# Patient Record
Sex: Female | Born: 2000 | Race: Black or African American | Hispanic: No | Marital: Single | Smoking: Never smoker
Health system: Southern US, Community
[De-identification: ages and names within clinical notes are randomized; demographics above are authoritative.]

## PROBLEM LIST (undated history)

## (undated) DIAGNOSIS — F419 Anxiety disorder, unspecified: Secondary | ICD-10-CM

## (undated) DIAGNOSIS — O24419 Gestational diabetes mellitus in pregnancy, unspecified control: Secondary | ICD-10-CM

## (undated) DIAGNOSIS — D571 Sickle-cell disease without crisis: Secondary | ICD-10-CM

## (undated) HISTORY — PX: NO PAST SURGERIES: SHX2092

## (undated) HISTORY — DX: Anxiety disorder, unspecified: F41.9

## (undated) NOTE — *Deleted (*Deleted)
Cardiac telemetry discontinued per order.

---
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
## 2018-02-01 ENCOUNTER — Emergency Department (HOSPITAL_COMMUNITY)
Admission: EM | Admit: 2018-02-01 | Discharge: 2018-02-02 | Disposition: A | Discharge status: Home / Self Care | Payer: Medicaid Other | Attending: Emergency Medicine | Admitting: Emergency Medicine

## 2018-02-01 ENCOUNTER — Other Ambulatory Visit: Payer: Self-pay

## 2018-02-01 ENCOUNTER — Encounter (HOSPITAL_COMMUNITY): Payer: Self-pay

## 2018-02-01 DIAGNOSIS — D57219 Sickle-cell/Hb-C disease with crisis, unspecified: Secondary | ICD-10-CM | POA: Diagnosis present

## 2018-02-01 DIAGNOSIS — D5700 Hb-SS disease with crisis, unspecified: Secondary | ICD-10-CM

## 2018-02-01 LAB — CBC WITH DIFFERENTIAL/PLATELET
BASOS ABS: 0.1 10*3/uL (ref 0.0–0.1)
Basophils Relative: 1 %
EOS ABS: 0.2 10*3/uL (ref 0.0–0.7)
Eosinophils Relative: 3 %
HCT: 29.4 % — ABNORMAL LOW (ref 36.0–46.0)
HEMOGLOBIN: 10.5 g/dL — AB (ref 12.0–15.0)
Lymphocytes Relative: 31 %
Lymphs Abs: 2 10*3/uL (ref 0.7–4.0)
MCH: 24.1 pg — ABNORMAL LOW (ref 26.0–34.0)
MCHC: 35.7 g/dL (ref 30.0–36.0)
MCV: 67.6 fL — ABNORMAL LOW (ref 78.0–100.0)
MONOS PCT: 9 %
Monocytes Absolute: 0.6 10*3/uL (ref 0.1–1.0)
NEUTROS PCT: 56 %
Neutro Abs: 3.7 10*3/uL (ref 1.7–7.7)
PLATELETS: 176 10*3/uL (ref 150–400)
RBC: 4.35 MIL/uL (ref 3.87–5.11)
RDW: 19 % — ABNORMAL HIGH (ref 11.5–15.5)
SMEAR REVIEW: ADEQUATE
WBC: 6.6 10*3/uL (ref 4.0–10.5)

## 2018-02-01 LAB — COMPREHENSIVE METABOLIC PANEL
ALBUMIN: 4 g/dL (ref 3.5–5.0)
ALK PHOS: 64 U/L (ref 38–126)
ALT: 18 U/L (ref 14–54)
ANION GAP: 9 (ref 5–15)
AST: 20 U/L (ref 15–41)
BILIRUBIN TOTAL: 1.3 mg/dL — AB (ref 0.3–1.2)
BUN: 8 mg/dL (ref 6–20)
CALCIUM: 9 mg/dL (ref 8.9–10.3)
CO2: 22 mmol/L (ref 22–32)
Chloride: 108 mmol/L (ref 101–111)
Creatinine, Ser: 0.67 mg/dL (ref 0.44–1.00)
GFR calc non Af Amer: >60 mL/min (ref >60)
GLUCOSE: 83 mg/dL (ref 65–99)
POTASSIUM: 4.2 mmol/L (ref 3.5–5.1)
SODIUM: 139 mmol/L (ref 135–145)
TOTAL PROTEIN: 7.1 g/dL (ref 6.5–8.1)

## 2018-02-01 LAB — I-STAT BETA HCG BLOOD, ED (MC, WL, AP ONLY)

## 2018-02-01 LAB — RETICULOCYTES
RBC.: 4.35 MIL/uL (ref 3.87–5.11)
RETIC COUNT ABSOLUTE: 113.1 10*3/uL (ref 19.0–186.0)
RETIC CT PCT: 2.6 % (ref 0.4–3.1)

## 2018-02-01 MED ORDER — HYDROMORPHONE HCL 1 MG/ML IJ SOLN
2.0000 mg | INTRAMUSCULAR | Status: AC
  Administered 2018-02-01: 2 mg via INTRAVENOUS
  Filled 2018-02-01: qty 2

## 2018-02-01 MED ORDER — HYDROMORPHONE HCL 1 MG/ML IJ SOLN: 2.0000 mg | INTRAMUSCULAR | Status: DC

## 2018-02-01 MED ORDER — KETOROLAC TROMETHAMINE 30 MG/ML IJ SOLN
30.0000 mg | INTRAMUSCULAR | Status: AC
  Administered 2018-02-01: 30 mg via INTRAVENOUS
  Filled 2018-02-01: qty 1

## 2018-02-01 MED ORDER — DEXTROSE-NACL 5-0.45 % IV SOLN
INTRAVENOUS | Status: AC
  Administered 2018-02-01: 20:00:00 via INTRAVENOUS

## 2018-02-01 MED ORDER — ONDANSETRON HCL 4 MG/2ML IJ SOLN: 4.0000 mg | INTRAMUSCULAR | Status: DC | PRN

## 2018-02-01 MED ORDER — DIPHENHYDRAMINE HCL 50 MG/ML IJ SOLN
25.0000 mg | Freq: Once | INTRAMUSCULAR | Status: AC
  Administered 2018-02-01: 25 mg via INTRAVENOUS
  Filled 2018-02-01: qty 1

## 2018-02-01 MED ORDER — HYDROMORPHONE HCL 1 MG/ML IJ SOLN: 2.0000 mg | INTRAMUSCULAR | Status: AC

## 2018-02-01 NOTE — ED Provider Notes (Signed)
[facility] Provider Note   CSN: [id number] Arrival date & time: 02/01/18  1424     History   Chief Complaint No chief complaint on file.   HPI Stacy Graciella FreerM Bechtol is a 17 y.o. female.  HPI  17 year old female presents the emergency department with 48 hours of right arm/right lower extremity pain that is similar to her acute on chronic exacerbations with in the setting of sickle cell disease.  Patient states she is taking her oxycodone with no relief of pain.  Patient denies any fever, chest pain, shortness of breath.  Patient denies any recent trauma or illness.  Past Medical History:  Diagnosis Date  . Sickle cell anemia (HCC)   . Sickle cell anemia [facility](HCC)     Patient Active Problem List   Diagnosis Date Noted  . Sickle-cell disease with pain (HCC) 08/01/2017  . Sickle cell disease, type [place], with unspecified crisis (HCC) 07/31/2017  . Sickle cell crisis (HCC) 05/28/2016  . 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 Sickle cell pain crisis (HCC) 01/29/2014  . Sickle cell pain crisis 12/12/2011    Past Surgical History:  Procedure Laterality Date  . NO PAST SURGERIES      OB History    No data available       Home Medications    Prior to Admission medications   Medication Sig Start Date End Date Taking? Authorizing Provider  acetaminophen (TYLENOL) 325 MG tablet Take 1.5 tablets (487.5 mg total) by mouth every 6 (six) hours. Patient not taking: Reported on 11/29/2017 01/27/17   Gardenia PhlegmMoore, Melissa Kepke, MD  ibuprofen (ADVIL,MOTRIN) 800 MG tablet Take 0.5 tablets (400 mg total) by mouth every 6 (six) hours. Patient not taking: Reported on 11/29/2017 01/27/17   Gardenia PhlegmMoore, Melissa Kepke, MD  medroxyPROGESTERone (DEPO-PROVERA) 150 MG/ML injection Inject 150 mg into the muscle every 3 (three) months. 01/17/18   [provider]  oxyCODONE (OXY IR/ROXICODONE) 5 MG immediate release tablet Take 1 tablet (5 mg total) by mouth every 6 (six)  hours as needed for moderate pain or severe pain. Patient not taking: Reported on 11/29/2017 07/26/17   Toney RakesLacroce, Samantha J, MD    Family History Family History  Problem Relation Age of Onset  . Sickle cell anemia Brother     Social History Social History   Tobacco Use  . Smoking status: Never Smoker  . Smokeless tobacco: Never Used  Substance Use Topics  . Alcohol use: No  . Drug use: No     Allergies   Patient has no known allergies.   Review of Systems Review of Systems  Review of Systems  Constitutional: Negative for fever and chills.  HENT: Negative for ear pain, sore throat and trouble swallowing.   Eyes: Negative for pain and visual disturbance.  Respiratory: Negative for cough and shortness of breath.   Cardiovascular: Negative for chest pain and leg swelling.  Gastrointestinal: Negative for nausea, vomiting, abdominal pain and diarrhea.  Genitourinary: Negative for dysuria, urgency and frequency.  Musculoskeletal: see HPI Skin: Negative for rash and wound.  Neurological: Negative for dizziness, syncope, speech difficulty, weakness and numbness.   Physical Exam Updated Vital Signs BP 107/75   Pulse (!) 104   Temp 98.2 F (36.8 C) (Oral)   Resp 13   Ht 5\' 7"  (1.702 m)   Wt 88.5 kg (195 lb)   LMP 01/18/2018   SpO2 99%   BMI 30.54 kg/m   Physical Exam  Physical Exam Vitals:  02/01/18 2300 02/01/18 2315  BP: 109/74 107/75  Pulse: 91 (!) 104  Resp: 13 13  Temp:    SpO2: 99% 99%   Constitutional: Patient is in no acute distress Head: Normocephalic and atraumatic.  Eyes: Extraocular motion intact, no scleral icterus Neck: Supple without meningismus, mass, or overt JVD Respiratory: Effort normal and breath sounds normal. No respiratory distress. CV: Heart regular rate and rhythm, no obvious murmurs.  Pulses +2 and symmetric Abdomen: Soft, non-tender, non-distended MSK: Extremities are atraumatic without deformity, ROM intact Skin: Warm, dry,  intact Neuro: Alert and oriented, no motor deficit noted Psychiatric: Mood and affect are normal.  ED Treatments / Results  Labs (all labs ordered are listed, but only abnormal results are displayed) Labs Reviewed  COMPREHENSIVE METABOLIC PANEL - Abnormal; Notable for the following components:      Result Value   Total Bilirubin 1.3 (*)    All other components within normal limits  CBC WITH DIFFERENTIAL/PLATELET - Abnormal; Notable for the following components:   Hemoglobin 10.5 (*)    HCT 29.4 (*)    MCV 67.6 (*)    MCH 24.1 (*)    RDW 19.0 (*)    All other components within normal limits  RETICULOCYTES  I-STAT BETA HCG BLOOD, ED ([facility], [facility], [facility] ONLY)    EKG  EKG Interpretation None       Radiology No results found.  Procedures Procedures (including critical care time)  Medications Ordered in ED Medications  dextrose 5 %-0.45 % sodium chloride infusion ( Intravenous Stopped 02/01/18 2104)  ondansetron (ZOFRAN) injection 4 mg (not administered)  HYDROmorphone (DILAUDID) injection 2 mg (not administered)  ketorolac (TORADOL) 30 MG/ML injection 30 mg (30 mg Intravenous Given 02/01/18 1931)  diphenhydrAMINE (BENADRYL) injection 25 mg (25 mg Intravenous Given 02/01/18 1930)  HYDROmorphone (DILAUDID) injection 2 mg (2 mg Intravenous Given 02/01/18 1932)  HYDROmorphone (DILAUDID) injection 2 mg (2 mg Intravenous Given 02/01/18 2102)     Initial Impression / Assessment and Plan / ED Course  I have reviewed the triage vital signs and the nursing notes.  Pertinent labs & imaging results that were available during my care of the patient were reviewed by me and considered in my medical decision making (see chart for details).     17 year old female presents the emergency department with 48 hours of right arm/right lower extremity pain that is similar to her acute on chronic exacerbations with in the setting of sickle cell disease.  Patient states she is taking her oxycodone with no  relief of pain.  Patient denies any fever, chest pain, shortness of breath.  Patient denies any recent trauma or illness.  Patient arrives afebrile hemodynamically stable well-appearing.  Physical exam as annotated above was unremarkable.  Review of patient's labs shows no evidence of leukocytosis concerning for infection, stable hemoglobin compared to last and normal.  Normal reticulocyte count.  Patient received total of 6 mg Dilaudid along with D5 normal saline bolus with resolution of symptoms currently asymptomatic.  Patient was hemodynamically stable prior to discharge.  Do not suspect chest pain syndrome, acute infection.  Resolution of sickle cell pain crisis.   Recommendation for d/c home with continued po narcotics and f/u PCP prn. Return precautions given.   Final Clinical Impressions(s) / ED Diagnoses   Final diagnoses:  Sickle cell pain crisis [facility])    ED Discharge Orders    None       Jaynie Collins, DO 02/01/18 2347    Melene Plan,  DO 02/01/18 2351

## 2018-02-01 NOTE — ED Notes (Signed)
Pt did not arouse after this nurse knocked on door and entered room, chest rise and fall easy and unlabored.

## 2018-02-01 NOTE — ED Triage Notes (Signed)
Pt presents to the ed with sickle cell pain in her right arm and leg x 2 days. NAD in triage.

## 2018-02-07 ENCOUNTER — Emergency Department (HOSPITAL_COMMUNITY)
Admission: EM | Admit: 2018-02-07 | Discharge: 2018-02-08 | Disposition: A | Discharge status: Home / Self Care | Payer: Medicaid Other | Attending: Physician Assistant | Admitting: Physician Assistant

## 2018-02-07 ENCOUNTER — Other Ambulatory Visit: Payer: Self-pay

## 2018-02-07 DIAGNOSIS — Z79899 Other long term (current) drug therapy: Secondary | ICD-10-CM | POA: Diagnosis not present

## 2018-02-07 DIAGNOSIS — D5700 Hb-SS disease with crisis, unspecified: Secondary | ICD-10-CM | POA: Insufficient documentation

## 2018-02-07 DIAGNOSIS — M79601 Pain in right arm: Secondary | ICD-10-CM | POA: Diagnosis present

## 2018-02-07 LAB — CBC WITH DIFFERENTIAL/PLATELET
BASOS ABS: 0 10*3/uL (ref 0.0–0.1)
Basophils Relative: 0 %
EOS PCT: 3 %
Eosinophils Absolute: 0.2 10*3/uL (ref 0.0–0.7)
HCT: 29.1 % — ABNORMAL LOW (ref 36.0–46.0)
HEMOGLOBIN: 10.5 g/dL — AB (ref 12.0–15.0)
LYMPHS PCT: 36 %
Lymphs Abs: 2.2 10*3/uL (ref 0.7–4.0)
MCH: 24.4 pg — ABNORMAL LOW (ref 26.0–34.0)
MCHC: 36.1 g/dL — ABNORMAL HIGH (ref 30.0–36.0)
MCV: 67.7 fL — ABNORMAL LOW (ref 78.0–100.0)
MONOS PCT: 6 %
Monocytes Absolute: 0.4 10*3/uL (ref 0.1–1.0)
NEUTROS ABS: 3.3 10*3/uL (ref 1.7–7.7)
Neutrophils Relative %: 55 %
Platelets: 161 10*3/uL (ref 150–400)
RBC: 4.3 MIL/uL (ref 3.87–5.11)
RDW: 19 % — ABNORMAL HIGH (ref 11.5–15.5)
WBC: 6.1 10*3/uL (ref 4.0–10.5)

## 2018-02-07 LAB — COMPREHENSIVE METABOLIC PANEL
ALBUMIN: 4.1 g/dL (ref 3.5–5.0)
ALT: 12 U/L — ABNORMAL LOW (ref 14–54)
ANION GAP: 9 (ref 5–15)
AST: 16 U/L (ref 15–41)
Alkaline Phosphatase: 63 U/L (ref 38–126)
BILIRUBIN TOTAL: 1.2 mg/dL (ref 0.3–1.2)
BUN: 10 mg/dL (ref 6–20)
CHLORIDE: 105 mmol/L (ref 101–111)
CO2: 27 mmol/L (ref 22–32)
Calcium: 8.9 mg/dL (ref 8.9–10.3)
Creatinine, Ser: 0.63 mg/dL (ref 0.44–1.00)
GFR calc Af Amer: >60 mL/min (ref >60)
GFR calc non Af Amer: >60 mL/min (ref >60)
GLUCOSE: 114 mg/dL — AB (ref 65–99)
POTASSIUM: 3.8 mmol/L (ref 3.5–5.1)
SODIUM: 141 mmol/L (ref 135–145)
TOTAL PROTEIN: 7.6 g/dL (ref 6.5–8.1)

## 2018-02-07 LAB — RETICULOCYTES
RBC.: 4.3 MIL/uL (ref 3.87–5.11)
RETIC COUNT ABSOLUTE: 116.1 10*3/uL (ref 19.0–186.0)
Retic Ct Pct: 2.7 % (ref 0.4–3.1)

## 2018-02-07 LAB — HCG, QUANTITATIVE, PREGNANCY: hCG, Beta Chain, Quant, S: <1 m[IU]/mL (ref <5)

## 2018-02-07 MED ORDER — ONDANSETRON HCL 4 MG/2ML IJ SOLN: 4.0000 mg | INTRAMUSCULAR | Status: DC | PRN

## 2018-02-07 MED ORDER — HYDROMORPHONE HCL 1 MG/ML IJ SOLN: 1.0000 mg | INTRAMUSCULAR | Status: AC

## 2018-02-07 MED ORDER — KETOROLAC TROMETHAMINE 30 MG/ML IJ SOLN
30.0000 mg | INTRAMUSCULAR | Status: AC
  Administered 2018-02-07: 30 mg via INTRAVENOUS
  Filled 2018-02-07: qty 1

## 2018-02-07 MED ORDER — HYDROMORPHONE HCL 1 MG/ML IJ SOLN
1.0000 mg | INTRAMUSCULAR | Status: AC
  Administered 2018-02-07: 1 mg via INTRAVENOUS

## 2018-02-07 MED ORDER — HYDROMORPHONE HCL 1 MG/ML IJ SOLN: 0.5000 mg | INTRAMUSCULAR | Status: AC

## 2018-02-07 MED ORDER — HYDROMORPHONE HCL 1 MG/ML IJ SOLN
0.5000 mg | INTRAMUSCULAR | Status: AC
  Filled 2018-02-07: qty 1

## 2018-02-07 MED ORDER — HYDROMORPHONE HCL 1 MG/ML IJ SOLN
1.0000 mg | INTRAMUSCULAR | Status: AC
  Filled 2018-02-07: qty 1

## 2018-02-07 MED ORDER — HYDROMORPHONE HCL 1 MG/ML IJ SOLN
1.0000 mg | INTRAMUSCULAR | Status: AC
  Administered 2018-02-08: 1 mg via INTRAVENOUS
  Filled 2018-02-07: qty 1

## 2018-02-07 MED ORDER — DIPHENHYDRAMINE HCL 50 MG/ML IJ SOLN
25.0000 mg | Freq: Once | INTRAMUSCULAR | Status: AC
  Administered 2018-02-07: 25 mg via INTRAVENOUS
  Filled 2018-02-07: qty 1

## 2018-02-07 MED ORDER — SODIUM CHLORIDE 0.45 % IV SOLN
INTRAVENOUS | Status: DC
  Administered 2018-02-07: via INTRAVENOUS

## 2018-02-07 NOTE — ED Provider Notes (Signed)
[facility]   CSN: [id number] Arrival date & time: 02/07/18  1901     History   Chief Complaint Chief Complaint  Patient presents with  . Sickle Cell Pain Crisis    HPI Nicole Case is a 17 y.o. female.  HPI   Patient is an 17 year old female with a history of sickle cell anemia, [place] disease presenting for right arm pain.  Patient reports that her pain is consistent with a sickle cell pain crisis.  Patient reports that she had similar pain last week, although it affected her right arm and her right leg.  Patient denies any recent trauma or injury preceding her arm pain.  Patient also noted to have lower back pain bilateral in nature.  Patient denies any weakness, numbness, loss of bowel bladder control, saddle anesthesia. Patient denies any chest pain, shortness of breath, fevers, chills, erythema or edema of her joints, abdominal pain, nausea, vomiting.  Patient reports taking her home oxycodone, unknown dose to her, without relief.   Past Medical History:  Diagnosis Date  . Sickle cell anemia (HCC)   . Sickle cell anemia [facility](HCC)     Patient Active Problem List   Diagnosis Date Noted  . Sickle-cell disease with pain (HCC) 08/01/2017  . Sickle cell disease, type [place], with unspecified crisis (HCC) 07/31/2017  . Sickle cell crisis (HCC) 05/28/2016  . 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 Sickle cell pain crisis (HCC) 01/29/2014  . Sickle cell pain crisis 12/12/2011    Past Surgical History:  Procedure Laterality Date  . NO PAST SURGERIES      OB History    No data available       Home Medications    Prior to Admission medications   Medication Sig Start Date End Date Taking? Authorizing Provider  medroxyPROGESTERone (DEPO-PROVERA) 150 MG/ML injection Inject 150 mg into the muscle every 3 (three) months. 01/17/18  Yes [provider]  oxyCODONE (OXY IR/ROXICODONE) 5 MG immediate release tablet Take 1 tablet  (5 mg total) by mouth every 6 (six) hours as needed for moderate pain or severe pain. 07/26/17  Yes Lacroce, Ames CoupeSamantha J, MD  acetaminophen (TYLENOL) 325 MG tablet Take 1.5 tablets (487.5 mg total) by mouth every 6 (six) hours. Patient not taking: Reported on 11/29/2017 01/27/17   Gardenia PhlegmMoore, Melissa Kepke, MD  ibuprofen (ADVIL,MOTRIN) 800 MG tablet Take 0.5 tablets (400 mg total) by mouth every 6 (six) hours. Patient not taking: Reported on 11/29/2017 01/27/17   Gardenia PhlegmMoore, Melissa Kepke, MD    Family History Family History  Problem Relation Age of Onset  . Sickle cell anemia Brother     Social History Social History   Tobacco Use  . Smoking status: Never Smoker  . Smokeless tobacco: Never Used  Substance Use Topics  . Alcohol use: No  . Drug use: No     Allergies   Patient has no known allergies.   Review of Systems Review of Systems  Constitutional: Negative for chills and fever.  HENT: Negative for congestion, sinus pain and sore throat.   Eyes: Negative for visual disturbance.  Respiratory: Negative for cough, chest tightness and shortness of breath.   Cardiovascular: Negative for chest pain and leg swelling.  Gastrointestinal: Negative for abdominal pain, nausea and vomiting.  Genitourinary: Negative for dysuria and flank pain.  Musculoskeletal: Positive for arthralgias, back pain and myalgias.  Skin: Negative for rash.  Neurological: Negative for dizziness, syncope and headaches.  Physical Exam Updated Vital Signs BP 122/75   Pulse 84   Temp 98.1 F (36.7 C) (Oral)   Resp 17   Ht 5\' 7"  (1.702 m)   Wt 88.5 kg (195 lb)   LMP 01/18/2018   SpO2 100%   BMI 30.54 kg/m   Physical Exam  Constitutional: She appears well-developed and well-nourished. No distress.  HENT:  Head: Normocephalic and atraumatic.  Mouth/Throat: Oropharynx is clear and moist.  Eyes: Conjunctivae and EOM are normal. Pupils are equal, round, and reactive to light.  Neck: Normal range of  motion. Neck supple.  Cardiovascular: Normal rate, regular rhythm, S1 normal and S2 normal.  No murmur heard. Pulmonary/Chest: Effort normal and breath sounds normal. She has no wheezes. She has no rales.  Abdominal: Soft. She exhibits no distension. There is no tenderness. There is no guarding.  Musculoskeletal: Normal range of motion. She exhibits no edema or deformity.  Tenderness to palpation of the right humerus without erythema, edema, or deformity.  Lymphadenopathy:    She has no cervical adenopathy.  Neurological: She is alert.  Cranial nerves grossly intact. Patient moves extremities symmetrically and with good coordination.  Skin: Skin is warm and dry. No rash noted. No erythema.  Psychiatric: She has a normal mood and affect. Her behavior is normal. Judgment and thought content normal.  Nursing note and vitals reviewed.    ED Treatments / Results  Labs (all labs ordered are listed, but only abnormal results are displayed) Labs Reviewed  COMPREHENSIVE METABOLIC PANEL - Abnormal; Notable for the following components:      Result Value   Glucose, Bld 114 (*)    ALT 12 (*)    All other components within normal limits  CBC WITH DIFFERENTIAL/PLATELET - Abnormal; Notable for the following components:   Hemoglobin 10.5 (*)    HCT 29.1 (*)    MCV 67.7 (*)    MCH 24.4 (*)    MCHC 36.1 (*)    RDW 19.0 (*)    All other components within normal limits  RETICULOCYTES  HCG, QUANTITATIVE, PREGNANCY    EKG  EKG Interpretation None       Radiology No results found.  Procedures Procedures (including critical care time)  Medications Ordered in ED Medications  0.45 % sodium chloride infusion ( Intravenous New Bag/Given 02/07/18 2330)  HYDROmorphone (DILAUDID) injection 0.5 mg (0.5 mg Intravenous Not Given 02/07/18 2304)    Or  HYDROmorphone (DILAUDID) injection 0.5 mg ( Subcutaneous See Alternative 02/07/18 2304)  HYDROmorphone (DILAUDID) injection 1 mg (not administered)     Or  HYDROmorphone (DILAUDID) injection 1 mg (not administered)  HYDROmorphone (DILAUDID) injection 1 mg (not administered)    Or  HYDROmorphone (DILAUDID) injection 1 mg (not administered)  ondansetron (ZOFRAN) injection 4 mg (not administered)  ketorolac (TORADOL) 30 MG/ML injection 30 mg (30 mg Intravenous Given 02/07/18 2332)  HYDROmorphone (DILAUDID) injection 1 mg (1 mg Intravenous Given 02/07/18 2331)    Or  HYDROmorphone (DILAUDID) injection 1 mg ( Subcutaneous See Alternative 02/07/18 2331)  diphenhydrAMINE (BENADRYL) injection 25 mg (25 mg Intravenous Given 02/07/18 2332)     Initial Impression / Assessment and Plan / ED Course  I have reviewed the triage vital signs and the nursing notes.  Pertinent labs & imaging results that were available during my care of the patient were reviewed by me and considered in my medical decision making (see chart for details).     Nicole Case is a 17 y.o. female who  presents to ED for pain c/w typical sickle cell crisis. No shortness of breath, fevers or signs of acute chest. Patient otherwise in no acute distress objectively other than complaint of pain. Given IV fluids and pain medication. Will obtain labs and continue to monitor with admit vs. Discharge based on response and results of testing.   Labs per baseline.  Stable anemia. No reticulocytosis, which is consistent with patient's last presentation of sickle cell crisis 6 days ago.  Patient re-evaluated and feels much improved. Comfortable with discharge to home. Tolerating PO. Understands to follow up with PCP and reasons to return to ER. All questions answered.  Final Clinical Impressions(s) / ED Diagnoses   Final diagnoses:  Sickle cell pain crisis [facility])    ED Discharge Orders    None       Delia Chimes 02/08/18 0147    Abelino Derrick, MD 02/12/18 1620

## 2018-02-07 NOTE — ED Triage Notes (Signed)
JPt st's she has sickle cell and c/o pain to right arm x's 2 days

## 2018-02-07 NOTE — ED Notes (Signed)
Pt does not need to use restroom. Given 8oz water.

## 2018-02-08 NOTE — Discharge Instructions (Signed)
Please see the information and instructions below regarding your visit.  Your diagnoses today include:  1. Sickle cell pain crisis (HCC)    Tests performed today include: See side panel of your discharge paperwork for testing performed today. Vital signs are listed at the bottom of these instructions.   Your labs are per your baseline.  Medications prescribed:    Take any prescribed medications only as prescribed, and any over the counter medications only as directed on the packaging.  Please resume your home sickle cell pain regimen.  Home care instructions:  Please follow any educational materials contained in this packet.   Please follow-up with your hematologist as soon as possible regarding recurrent sickle cell pain crisis.  Follow-up instructions: Please follow-up with your primary care provider as soon as possible for further evaluation of your symptoms if they are not completely improved.   Return instructions:  Please return to the Emergency Department if you experience worsening symptoms.  Please return to the emergency department if any worsening pain, redness or swelling of your joints chest pain, shortness of breath, belly pain, nausea or vomiting.,  Please return if you have any other emergent concerns.  Additional Information:   Your vital signs today were: BP 120/79    Pulse 74    Temp 98.1 F (36.7 C) (Oral)    Resp 16    Ht 5\' 7"  (1.702 m)    Wt 88.5 kg (195 lb)    LMP 01/18/2018    SpO2 100%    BMI 30.54 kg/m  If your blood pressure (BP) was elevated on multiple readings during this visit above 130 for the top number or above 80 for the bottom number, please have this repeated by your primary care provider within one month. --------------  Thank you for allowing us to participate in your care today.

## 2018-02-27 ENCOUNTER — Encounter (HOSPITAL_COMMUNITY): Payer: Self-pay | Admitting: Emergency Medicine

## 2018-02-27 ENCOUNTER — Other Ambulatory Visit: Payer: Self-pay

## 2018-02-27 DIAGNOSIS — D5700 Hb-SS disease with crisis, unspecified: Secondary | ICD-10-CM | POA: Diagnosis not present

## 2018-02-27 DIAGNOSIS — Z79899 Other long term (current) drug therapy: Secondary | ICD-10-CM | POA: Diagnosis not present

## 2018-02-27 DIAGNOSIS — M79601 Pain in right arm: Secondary | ICD-10-CM | POA: Diagnosis present

## 2018-02-27 LAB — CBC WITH DIFFERENTIAL/PLATELET
BASOS PCT: 0 %
Basophils Absolute: 0 10*3/uL (ref 0.0–0.1)
EOS PCT: 2 %
Eosinophils Absolute: 0.1 10*3/uL (ref 0.0–0.7)
HCT: 28.5 % — ABNORMAL LOW (ref 36.0–46.0)
Hemoglobin: 9.9 g/dL — ABNORMAL LOW (ref 12.0–15.0)
Lymphocytes Relative: 33 %
Lymphs Abs: 2.3 10*3/uL (ref 0.7–4.0)
MCH: 23.5 pg — AB (ref 26.0–34.0)
MCHC: 34.7 g/dL (ref 30.0–36.0)
MCV: 67.7 fL — AB (ref 78.0–100.0)
MONO ABS: 0.5 10*3/uL (ref 0.1–1.0)
Monocytes Relative: 7 %
Neutro Abs: 4.2 10*3/uL (ref 1.7–7.7)
Neutrophils Relative %: 58 %
PLATELETS: 155 10*3/uL (ref 150–400)
RBC: 4.21 MIL/uL (ref 3.87–5.11)
RDW: 19.3 % — AB (ref 11.5–15.5)
WBC: 7.1 10*3/uL (ref 4.0–10.5)

## 2018-02-27 LAB — COMPREHENSIVE METABOLIC PANEL
ALT: 19 U/L (ref 14–54)
AST: 20 U/L (ref 15–41)
Albumin: 3.8 g/dL (ref 3.5–5.0)
Alkaline Phosphatase: 62 U/L (ref 38–126)
Anion gap: 7 (ref 5–15)
BUN: 8 mg/dL (ref 6–20)
CHLORIDE: 108 mmol/L (ref 101–111)
CO2: 26 mmol/L (ref 22–32)
CREATININE: 0.62 mg/dL (ref 0.44–1.00)
Calcium: 8.9 mg/dL (ref 8.9–10.3)
GFR calc Af Amer: >60 mL/min (ref >60)
Glucose, Bld: 101 mg/dL — ABNORMAL HIGH (ref 65–99)
Potassium: 4 mmol/L (ref 3.5–5.1)
Sodium: 141 mmol/L (ref 135–145)
Total Bilirubin: 1.3 mg/dL — ABNORMAL HIGH (ref 0.3–1.2)
Total Protein: 7.1 g/dL (ref 6.5–8.1)

## 2018-02-27 LAB — RETICULOCYTES
RBC.: 4.21 MIL/uL (ref 3.87–5.11)
Retic Count, Absolute: 130.5 10*3/uL (ref 19.0–186.0)
Retic Ct Pct: 3.1 % (ref 0.4–3.1)

## 2018-02-27 LAB — I-STAT BETA HCG BLOOD, ED (MC, WL, AP ONLY): I-stat hCG, quantitative: <5 m[IU]/mL (ref <5)

## 2018-02-27 NOTE — ED Triage Notes (Signed)
Pt reports R arm pain X3 days. States she is in a sickle cell crisis. Has tried her oxycodone prescription with no relief.

## 2018-02-28 ENCOUNTER — Encounter (HOSPITAL_COMMUNITY): Payer: Self-pay | Admitting: Emergency Medicine

## 2018-02-28 ENCOUNTER — Emergency Department (HOSPITAL_COMMUNITY)
Admission: EM | Admit: 2018-02-28 | Discharge: 2018-02-28 | Disposition: A | Discharge status: Home / Self Care | Payer: Medicaid Other | Attending: Emergency Medicine | Admitting: Emergency Medicine

## 2018-02-28 DIAGNOSIS — D5700 Hb-SS disease with crisis, unspecified: Secondary | ICD-10-CM

## 2018-02-28 MED ORDER — HYDROMORPHONE HCL 1 MG/ML IJ SOLN: 2.0000 mg | INTRAMUSCULAR | Status: AC

## 2018-02-28 MED ORDER — DIPHENHYDRAMINE HCL 25 MG PO CAPS
25.0000 mg | ORAL_CAPSULE | Freq: Once | ORAL | Status: AC
  Administered 2018-02-28: 25 mg via ORAL
  Filled 2018-02-28: qty 1

## 2018-02-28 MED ORDER — ONDANSETRON HCL 4 MG/2ML IJ SOLN: 4.0000 mg | INTRAMUSCULAR | Status: DC | PRN

## 2018-02-28 MED ORDER — KETOROLAC TROMETHAMINE 30 MG/ML IJ SOLN
30.0000 mg | INTRAMUSCULAR | Status: AC
  Administered 2018-02-28: 30 mg via INTRAVENOUS
  Filled 2018-02-28: qty 1

## 2018-02-28 MED ORDER — HYDROMORPHONE HCL 1 MG/ML IJ SOLN
2.0000 mg | INTRAMUSCULAR | Status: AC
  Administered 2018-02-28: 2 mg via INTRAVENOUS
  Filled 2018-02-28: qty 2

## 2018-02-28 MED ORDER — DEXTROSE-NACL 5-0.9 % IV SOLN
Freq: Once | INTRAVENOUS | Status: AC
  Administered 2018-02-28: 01:00:00 via INTRAVENOUS

## 2018-02-28 NOTE — ED Provider Notes (Signed)
[facility] EMERGENCY DEPARTMENT Provider Note   CSN: [id number] Arrival date & time: 02/27/18  1849     History   Chief Complaint Chief Complaint  Patient presents with  . Sickle Cell Pain Crisis    HPI Nicole Case is a 17 y.o. female.  The history is provided by the patient.  Sickle Cell Pain Crisis  Location:  Upper extremity (right arm) Severity:  Severe Onset quality:  Gradual Duration:  3 days Similar to previous crisis episodes: yes   Timing:  Constant Progression:  Worsening Chronicity:  Chronic (acute on chronic) Sickle cell genotype:  [place] Context: not alcohol consumption and not infection   Relieved by:  Nothing Worsened by:  Nothing Ineffective treatments:  Prescription drugs (oxycodone ) Associated symptoms: no chest pain, no cough, no fever and no shortness of breath   Risk factors: no cholecystectomy   No atypia, no f/c/r. No CP or SOB.    Past Medical History:  Diagnosis Date  . Sickle cell anemia (HCC)   . Sickle cell anemia [facility])     Patient Active Problem List   Diagnosis Date Noted  . Sickle-cell disease with pain (HCC) 08/01/2017  . Sickle cell disease, type [place], with unspecified crisis (HCC) 07/31/2017  . Sickle cell crisis (HCC) 05/28/2016  . 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 Sickle cell pain crisis (HCC) 01/29/2014  . Sickle cell pain crisis 12/12/2011    Past Surgical History:  Procedure Laterality Date  . NO PAST SURGERIES       OB History   None      Home Medications    Prior to Admission medications   Medication Sig Start Date End Date Taking? Authorizing Provider  acetaminophen (TYLENOL) 325 MG tablet Take 1.5 tablets (487.5 mg total) by mouth every 6 (six) hours. Patient not taking: Reported on 11/29/2017 01/27/17   Gardenia Phlegm, MD  ibuprofen (ADVIL,MOTRIN) 800 MG tablet Take 0.5 tablets (400 mg total) by mouth every 6 (six) hours. Patient not taking: Reported on 11/29/2017 01/27/17    Gardenia Phlegm, MD  medroxyPROGESTERone (DEPO-PROVERA) 150 MG/ML injection Inject 150 mg into the muscle every 3 (three) months. 01/17/18   [provider]  oxyCODONE (OXY IR/ROXICODONE) 5 MG immediate release tablet Take 1 tablet (5 mg total) by mouth every 6 (six) hours as needed for moderate pain or severe pain. 07/26/17   Toney Rakes, MD    Family History Family History  Problem Relation Age of Onset  . Sickle cell anemia Brother     Social History Social History   Tobacco Use  . Smoking status: Never Smoker  . Smokeless tobacco: Never Used  Substance Use Topics  . Alcohol use: No  . Drug use: No     Allergies   Patient has no known allergies.   Review of Systems Review of Systems  Constitutional: Negative for fever.  Respiratory: Negative for cough and shortness of breath.   Cardiovascular: Negative for chest pain, palpitations and leg swelling.  Musculoskeletal: Positive for arthralgias.  All other systems reviewed and are negative.    Physical Exam Updated Vital Signs BP 128/84   Pulse 89   Temp 98.6 F (37 C) (Oral)   Resp 16   Ht 5\' 7"  (1.702 m)   Wt 88.5 kg (195 lb)   SpO2 100%   BMI 30.54 kg/m   Physical Exam  Constitutional: She is oriented to person, place, and time. She appears well-developed and well-nourished. No  distress.  HENT:  Head: Normocephalic and atraumatic.  Nose: Nose normal.  Eyes: Conjunctivae and EOM are normal.  Neck: Normal range of motion. Neck supple. No thyromegaly present.  Cardiovascular: Normal rate, regular rhythm, normal heart sounds and intact distal pulses.  Pulmonary/Chest: Effort normal and breath sounds normal. No stridor. She has no wheezes. She has no rales.  Abdominal: Soft. Bowel sounds are normal. There is no tenderness.  Musculoskeletal: Normal range of motion. She exhibits no edema, tenderness or deformity.       Right elbow: Normal.      Right wrist: Normal.       Right hand: She  exhibits normal capillary refill. Normal sensation noted. Normal strength noted.  Neurological: She is alert and oriented to person, place, and time.  Skin: Skin is warm and dry. Capillary refill takes less than 2 seconds. She is not diaphoretic.  Psychiatric: She has a normal mood and affect.     ED Treatments / Results  Labs (all labs ordered are listed, but only abnormal results are displayed) Labs Reviewed  COMPREHENSIVE METABOLIC PANEL - Abnormal; Notable for the following components:      Result Value   Glucose, Bld 101 (*)    Total Bilirubin 1.3 (*)    All other components within normal limits  CBC WITH DIFFERENTIAL/PLATELET - Abnormal; Notable for the following components:   Hemoglobin 9.9 (*)    HCT 28.5 (*)    MCV 67.7 (*)    MCH 23.5 (*)    RDW 19.3 (*)    All other components within normal limits  RETICULOCYTES  I-STAT BETA HCG BLOOD, ED ([facility], [facility], [facility] ONLY)    EKG None  Radiology No results found.  Procedures Procedures (including critical care time)  Medications Ordered in ED Medications  ketorolac (TORADOL) 30 MG/ML injection 30 mg (has no administration in time range)  HYDROmorphone (DILAUDID) injection 2 mg (has no administration in time range)    Or  HYDROmorphone (DILAUDID) injection 2 mg (has no administration in time range)  HYDROmorphone (DILAUDID) injection 2 mg (has no administration in time range)    Or  HYDROmorphone (DILAUDID) injection 2 mg (has no administration in time range)  HYDROmorphone (DILAUDID) injection 2 mg (has no administration in time range)    Or  HYDROmorphone (DILAUDID) injection 2 mg (has no administration in time range)  HYDROmorphone (DILAUDID) injection 2 mg (has no administration in time range)    Or  HYDROmorphone (DILAUDID) injection 2 mg (has no administration in time range)  ondansetron (ZOFRAN) injection 4 mg (has no administration in time range)  diphenhydrAMINE (BENADRYL) capsule 25 mg (has no administration in  time range)  dextrose 5 %-0.9 % sodium chloride infusion (has no administration in time range)       Final Clinical Impressions(s) / ED Diagnoses   Return for weakness, numbness, changes in vision or speech, fevers >100.4 unrelieved by medication, shortness of breath, intractable vomiting, or diarrhea, abdominal pain, Inability to tolerate liquids or food, cough, altered mental status or any concerns. No signs of systemic illness or infection. The patient is nontoxic-appearing on exam and vital signs are within normal limits.   I have reviewed the triage vital signs and the nursing notes. Pertinent labs &imaging results that were available during my care of the patient were reviewed by me and considered in my medical decision making (see chart for details).  After history, exam, and medical workup I feel the patient has been appropriately medically screened  and is safe for discharge home. Pertinent diagnoses were discussed with the patient. Patient was given return precautions.   Taygen Acklin, MD 02/28/18 [phone]

## 2018-02-28 NOTE — ED Notes (Signed)
EDP notified that patient had ride and needed to be discharged; EDP advised patient may sign out A[facility],RN

## 2018-02-28 NOTE — ED Notes (Signed)
E-pad malfunctioned while patient signed AMA-Monique,RN

## 2018-03-05 ENCOUNTER — Encounter (HOSPITAL_COMMUNITY): Payer: Self-pay

## 2018-03-05 ENCOUNTER — Other Ambulatory Visit: Payer: Self-pay

## 2018-03-05 ENCOUNTER — Emergency Department (HOSPITAL_COMMUNITY)
Admission: EM | Admit: 2018-03-05 | Discharge: 2018-03-06 | Disposition: A | Discharge status: Home / Self Care | Payer: Medicaid Other | Attending: Emergency Medicine | Admitting: Emergency Medicine

## 2018-03-05 DIAGNOSIS — Z79899 Other long term (current) drug therapy: Secondary | ICD-10-CM | POA: Diagnosis not present

## 2018-03-05 DIAGNOSIS — M79602 Pain in left arm: Secondary | ICD-10-CM | POA: Diagnosis present

## 2018-03-05 DIAGNOSIS — D5700 Hb-SS disease with crisis, unspecified: Secondary | ICD-10-CM | POA: Insufficient documentation

## 2018-03-05 LAB — CBC WITH DIFFERENTIAL/PLATELET
BASOS PCT: 0 %
Basophils Absolute: 0 10*3/uL (ref 0.0–0.1)
EOS ABS: 0.1 10*3/uL (ref 0.0–0.7)
Eosinophils Relative: 1 %
HCT: 30.1 % — ABNORMAL LOW (ref 36.0–46.0)
Hemoglobin: 10.4 g/dL — ABNORMAL LOW (ref 12.0–15.0)
Lymphocytes Relative: 27 %
Lymphs Abs: 2 10*3/uL (ref 0.7–4.0)
MCH: 23.4 pg — ABNORMAL LOW (ref 26.0–34.0)
MCHC: 34.6 g/dL (ref 30.0–36.0)
MCV: 67.8 fL — ABNORMAL LOW (ref 78.0–100.0)
MONOS PCT: 7 %
Monocytes Absolute: 0.5 10*3/uL (ref 0.1–1.0)
NEUTROS PCT: 65 %
Neutro Abs: 4.6 10*3/uL (ref 1.7–7.7)
PLATELETS: 158 10*3/uL (ref 150–400)
RBC: 4.44 MIL/uL (ref 3.87–5.11)
RDW: 19.4 % — AB (ref 11.5–15.5)
WBC: 7.2 10*3/uL (ref 4.0–10.5)

## 2018-03-05 LAB — I-STAT BETA HCG BLOOD, ED (MC, WL, AP ONLY): I-stat hCG, quantitative: <5 m[IU]/mL (ref <5)

## 2018-03-05 LAB — RETICULOCYTES
RBC.: 4.44 MIL/uL (ref 3.87–5.11)
RETIC CT PCT: 3.5 % — AB (ref 0.4–3.1)
Retic Count, Absolute: 155.4 10*3/uL (ref 19.0–186.0)

## 2018-03-05 LAB — COMPREHENSIVE METABOLIC PANEL
ALK PHOS: 55 U/L (ref 38–126)
ALT: 14 U/L (ref 14–54)
AST: 18 U/L (ref 15–41)
Albumin: 4 g/dL (ref 3.5–5.0)
Anion gap: 9 (ref 5–15)
BUN: 11 mg/dL (ref 6–20)
CALCIUM: 9.1 mg/dL (ref 8.9–10.3)
CO2: 24 mmol/L (ref 22–32)
CREATININE: 0.66 mg/dL (ref 0.44–1.00)
Chloride: 108 mmol/L (ref 101–111)
Glucose, Bld: 95 mg/dL (ref 65–99)
Potassium: 3.9 mmol/L (ref 3.5–5.1)
Sodium: 141 mmol/L (ref 135–145)
Total Bilirubin: 1.4 mg/dL — ABNORMAL HIGH (ref 0.3–1.2)
Total Protein: 7.2 g/dL (ref 6.5–8.1)

## 2018-03-05 MED ORDER — HYDROMORPHONE HCL 1 MG/ML IJ SOLN
1.0000 mg | INTRAMUSCULAR | Status: AC
  Administered 2018-03-05: 1 mg via INTRAVENOUS
  Filled 2018-03-05: qty 1

## 2018-03-05 MED ORDER — ONDANSETRON 4 MG PO TBDP
4.0000 mg | ORAL_TABLET | Freq: Once | ORAL | Status: AC
  Administered 2018-03-05: 4 mg via ORAL
  Filled 2018-03-05: qty 1

## 2018-03-05 MED ORDER — ONDANSETRON HCL 4 MG/2ML IJ SOLN
4.0000 mg | INTRAMUSCULAR | Status: DC | PRN
  Filled 2018-03-05: qty 2

## 2018-03-05 MED ORDER — KETOROLAC TROMETHAMINE 30 MG/ML IJ SOLN
30.0000 mg | INTRAMUSCULAR | Status: AC
  Administered 2018-03-05: 30 mg via INTRAVENOUS
  Filled 2018-03-05: qty 1

## 2018-03-05 MED ORDER — HYDROMORPHONE HCL 1 MG/ML IJ SOLN
0.5000 mg | INTRAMUSCULAR | Status: AC
  Administered 2018-03-05: 0.5 mg via INTRAVENOUS
  Filled 2018-03-05: qty 1

## 2018-03-05 MED ORDER — HYDROMORPHONE HCL 1 MG/ML IJ SOLN
1.0000 mg | Freq: Once | INTRAMUSCULAR | Status: AC
  Administered 2018-03-06: 1 mg via INTRAVENOUS
  Filled 2018-03-05: qty 1

## 2018-03-05 MED ORDER — HYDROMORPHONE HCL 1 MG/ML IJ SOLN: 0.5000 mg | INTRAMUSCULAR | Status: AC

## 2018-03-05 MED ORDER — HYDROMORPHONE HCL 1 MG/ML IJ SOLN: 1.0000 mg | INTRAMUSCULAR | Status: AC

## 2018-03-05 MED ORDER — DIPHENHYDRAMINE HCL 25 MG PO CAPS: 25.0000 mg | ORAL_CAPSULE | ORAL | Status: DC | PRN

## 2018-03-05 MED ORDER — DEXTROSE-NACL 5-0.45 % IV SOLN
INTRAVENOUS | Status: DC
  Administered 2018-03-05: 22:00:00 via INTRAVENOUS

## 2018-03-05 MED ORDER — HYDROMORPHONE HCL 1 MG/ML IJ SOLN
0.5000 mg | Freq: Once | INTRAMUSCULAR | Status: AC
  Administered 2018-03-05: 0.5 mg via SUBCUTANEOUS
  Filled 2018-03-05: qty 1

## 2018-03-05 NOTE — ED Triage Notes (Signed)
Pt presents in pain crisis from sickle cell that began yesterday.  Pt taking oxycodone and ibuprofen without relief.  Pt denies any shortness of breath.

## 2018-03-05 NOTE — ED Provider Notes (Signed)
Care [facility] from [facility] Petrucelli, PA-C.  Please see her full H&P.  In short,  Nicole Case is a 17 y.o. female presents for sickle cell pain.  Pt reports left upper and left lower extremity pain.  No chest pain or SOB.  No fever.  Pt reports this pain is the same as her usual sickle cell pain.  She reports last hospitalization was several months ago.    Physical Exam  BP 121/77   Pulse 76   Temp 99.4 F (37.4 C) (Oral)   Resp 14   Wt 96.5 kg (212 lb 11.9 oz)   LMP 02/26/2018 (Approximate)   SpO2 100%   BMI 33.32 kg/m   Physical Exam  Constitutional: She appears well-developed and well-nourished. No distress.  HENT:  Head: Normocephalic.  Eyes: Conjunctivae are normal. No scleral icterus.  Neck: Normal range of motion.  Cardiovascular: Normal rate and intact distal pulses.  Pulmonary/Chest: Effort normal.  Musculoskeletal: Normal range of motion.  Neurological: She is alert.  Skin: Skin is warm and dry.  Nursing note and vitals reviewed.   ED Course/Procedures   Clinical Course as of Mar 06 32  Tue Mar 05, 2018  2226 Plan:  Pt has received 1 IV dose of pain control.  Pt will receive additional 2 doses and will recheck.     [HM]  2346 Patient reports she is feeling better.  She does request her third dose but feels she will be likely able to go home at that time.   [HM]  Wed Mar 06, 2018  0027 Pt reports she is feeling much better at this time and wishes for discharge home   [HM]  0027 Tachycardia had resolved upon my last assessment with HR in the low 90s.     [HM]    Clinical Course User Index [HM] Nicole Case, Nicole ClientHannah, PA-C    Procedures  MDM    Presents with sickle cell pain.  No fevers, chest pain or shortness of breath.  No clinical evidence of acute chest syndrome.  Labs are reassuring.  Patient is feeling better.  We will discharged home.  I have encouraged close primary care follow-up.  Patient states understanding and is in agreement with this  plan.   Sickle cell pain crisis [facility](HCC)      Nicole Case, Nicole KerbsHannah, PA-C 03/06/18 0034    Melene PlanFloyd, Dan, DO 03/06/18 1459

## 2018-03-05 NOTE — ED Provider Notes (Signed)
[facility] EMERGENCY DEPARTMENT Provider Note   CSN: [id number] Arrival date & time: 03/05/18  1818     History   Chief Complaint No chief complaint on file.   HPI Nicole Case is a 17 y.o. female with a history of sickle cell anemia who presents the emergency department complaining of left upper and left lower extremity pain that started yesterday.  Patient states pain is located to the diffuse left arm and left leg. States that pain is consistent with previous sickle cell pain.  She has been taking her oxycodone and ibuprofen at home with some relief but not resolution of her pain.  Rates current pain a 6 out of 10 in severity.  Pain was somewhat improved with Dilaudid received by triage.  Denies numbness, weakness, chest pain, dyspnea, or fevers.  HPI  Past Medical History:  Diagnosis Date  . Sickle cell anemia (HCC)   . Sickle cell anemia [facility])     Patient Active Problem List   Diagnosis Date Noted  . Sickle-cell disease with pain (HCC) 08/01/2017  . Sickle cell disease, type [place], with unspecified crisis (HCC) 07/31/2017  . Sickle cell crisis (HCC) 05/28/2016  . 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 Sickle cell pain crisis (HCC) 01/29/2014  . Sickle cell pain crisis 12/12/2011    Past Surgical History:  Procedure Laterality Date  . NO PAST SURGERIES       OB History   None      Home Medications    Prior to Admission medications   Medication Sig Start Date End Date Taking? Authorizing Provider  acetaminophen (TYLENOL) 325 MG tablet Take 1.5 tablets (487.5 mg total) by mouth every 6 (six) hours. Patient not taking: Reported on 11/29/2017 01/27/17   Gardenia Phlegm, MD  ibuprofen (ADVIL,MOTRIN) 800 MG tablet Take 0.5 tablets (400 mg total) by mouth every 6 (six) hours. Patient not taking: Reported on 11/29/2017 01/27/17   Gardenia Phlegm, MD  medroxyPROGESTERone (DEPO-PROVERA) 150 MG/ML injection Inject 150 mg into the muscle every 3  (three) months. 01/17/18   [provider]  oxyCODONE (OXY IR/ROXICODONE) 5 MG immediate release tablet Take 1 tablet (5 mg total) by mouth every 6 (six) hours as needed for moderate pain or severe pain. 07/26/17   Toney Rakes, MD    Family History Family History  Problem Relation Age of Onset  . Sickle cell anemia Brother     Social History Social History   Tobacco Use  . Smoking status: Never Smoker  . Smokeless tobacco: Never Used  Substance Use Topics  . Alcohol use: No  . Drug use: No     Allergies   Patient has no known allergies.   Review of Systems Review of Systems  Constitutional: Negative for chills and fever.  Respiratory: Negative for cough and shortness of breath.   Cardiovascular: Negative for chest pain.  Musculoskeletal: Positive for myalgias (Left upper and left lower extremities).  Neurological: Negative for weakness and numbness.  All other systems reviewed and are negative.    Physical Exam Updated Vital Signs BP 121/77 (BP Location: Right Arm)   Pulse 81   Temp 99.4 F (37.4 C) (Oral)   Resp 13   LMP 02/26/2018 (Approximate)   SpO2 100%   Physical Exam  Constitutional: She appears well-developed and well-nourished. No distress.  HENT:  Head: Normocephalic and atraumatic.  Eyes: Conjunctivae are normal. Right eye exhibits no discharge. Left eye exhibits no discharge.  Cardiovascular: Normal rate  and regular rhythm.  No murmur heard. Pulses:      Radial pulses are 2+ on the right side, and 2+ on the left side.       Posterior tibial pulses are 2+ on the right side, and 2+ on the left side.  Pulmonary/Chest: Breath sounds normal. No respiratory distress. She has no wheezes. She has no rales.  Abdominal: Soft. She exhibits no distension. There is no tenderness.  Musculoskeletal:  Upper extremities: Patient has no obvious deformities, appreciable swelling, erythema, or ecchymosis.  No overlying warmth.  She has full range of  motion at all joints.  She is mildly diffusely tender to the left upper extremity, no point/focal tenderness. Back: No midline tenderness. Lower extremities: Patient has no obvious deformities, appreciable swelling, erythema, or ecchymosis.  No overlying warmth.  She has full range of motion in all joints.  She is mildly diffusely tender to the left lower extremity, no point/focal tenderness.  Neurological: She is alert.  Clear speech.  Patient has 5 out of 5 grip strength bilaterally.  Patient has 5 out of 5 strength with plantar and dorsiflexion bilaterally.  Sensation grossly intact bilateral upper and lower extremities.  Skin: Skin is warm and dry. No rash noted.  Psychiatric: She has a normal mood and affect. Her behavior is normal.  Nursing note and vitals reviewed.   ED Treatments / Results  Labs Results for orders placed or performed during the hospital encounter of 03/05/18  Comprehensive metabolic panel  Result Value Ref Range   Sodium 141 135 - 145 mmol/L   Potassium 3.9 3.5 - 5.1 mmol/L   Chloride 108 101 - 111 mmol/L   CO2 24 22 - 32 mmol/L   Glucose, Bld 95 65 - 99 mg/dL   BUN 11 6 - 20 mg/dL   Creatinine, Ser 4.090.66 0.44 - 1.00 mg/dL   Calcium 9.1 8.9 - 81.110.3 mg/dL   Total Protein 7.2 6.5 - 8.1 g/dL   Albumin 4.0 3.5 - 5.0 g/dL   AST 18 15 - 41 U/L   ALT 14 14 - 54 U/L   Alkaline Phosphatase 55 38 - 126 U/L   Total Bilirubin 1.4 (H) 0.3 - 1.2 mg/dL   GFR calc non Af Amer >60 >60 mL/min   GFR calc Af Amer >60 >60 mL/min   Anion gap 9 5 - 15  CBC with Differential  Result Value Ref Range   WBC 7.2 4.0 - 10.5 K/uL   RBC 4.44 3.87 - 5.11 MIL/uL   Hemoglobin 10.4 (L) 12.0 - 15.0 g/dL   HCT 91.430.1 (L) 78.236.0 - 95.646.0 %   MCV 67.8 (L) 78.0 - 100.0 fL   MCH 23.4 (L) 26.0 - 34.0 pg   MCHC 34.6 30.0 - 36.0 g/dL   RDW 21.319.4 (H) 08.611.5 - 57.815.5 %   Platelets 158 150 - 400 K/uL   Neutrophils Relative % 65 %   Neutro Abs 4.6 1.7 - 7.7 K/uL   Lymphocytes Relative 27 %   Lymphs Abs 2.0  0.7 - 4.0 K/uL   Monocytes Relative 7 %   Monocytes Absolute 0.5 0.1 - 1.0 K/uL   Eosinophils Relative 1 %   Eosinophils Absolute 0.1 0.0 - 0.7 K/uL   Basophils Relative 0 %   Basophils Absolute 0.0 0.0 - 0.1 K/uL  Reticulocytes  Result Value Ref Range   Retic Ct Pct 3.5 (H) 0.4 - 3.1 %   RBC. 4.44 3.87 - 5.11 MIL/uL   Retic Count, Absolute  155.4 19.0 - 186.0 K/uL  I-Stat beta hCG blood, ED  Result Value Ref Range   I-stat hCG, quantitative <5.0 <5 mIU/mL   Comment 3           No results found. EKG None  Radiology No results found.  Procedures Procedures (including critical care time)  Medications Ordered in ED Medications  dextrose 5 %-0.45 % sodium chloride infusion (has no administration in time range)  ketorolac (TORADOL) 30 MG/ML injection 30 mg (has no administration in time range)  HYDROmorphone (DILAUDID) injection 0.5 mg (has no administration in time range)    Or  HYDROmorphone (DILAUDID) injection 0.5 mg (has no administration in time range)  diphenhydrAMINE (BENADRYL) capsule 25-50 mg (has no administration in time range)  ondansetron (ZOFRAN) injection 4 mg (has no administration in time range)  HYDROmorphone (DILAUDID) injection 1 mg (has no administration in time range)    Or  HYDROmorphone (DILAUDID) injection 1 mg (has no administration in time range)  HYDROmorphone (DILAUDID) injection 0.5 mg (0.5 mg Subcutaneous Given 03/05/18 1851)  ondansetron (ZOFRAN-ODT) disintegrating tablet 4 mg (4 mg Oral Given 03/05/18 1850)     Initial Impression / Assessment and Plan / ED Course  I have reviewed the triage vital signs and the nursing notes.  Pertinent labs & imaging results that were available during my care of the patient were reviewed by me and considered in my medical decision making (see chart for details).  Patient presents with pain that she states is consistent with her typical sick cell pain. She is nontoxic appearing, initial vitals with mild  tachycardia which normalized upon my evaluation and repeat vitals. Patient NVI distal to all areas of discomfort. She is not complaining of any dyspnea, chest pain, fevers, or signs of acute chest syndrome. Lab work reviewed and appears fairly consistent with patient's prior lab work upon chart review. Sickle cell order set utilized to initiate treatment with with fluids and analgesics.   22:20: RE-EVAL: Patient pain improved from 6/10 in severity to 5/10 in severity following initial pain medication.   22:26: Patient signed out to [facility] Muthersbaugh PA-C at change of shift pending re-evaluation.   Clinical Course as of Mar 05 2236  Tue Mar 05, 2018  2226 Plan:  Pt has received 1 IV dose of pain control.  Pt will receive additional 2 doses and will recheck.     [HM]    Clinical Course User Index [HM] Muthersbaugh, Dahlia Client, [place]    Final Clinical Impressions(s) / ED Diagnoses   Final diagnoses:  None    ED Discharge Orders    None       Cherly Anderson, PA-C 03/05/18 2240    Bethann Berkshire, MD 03/08/18 1553

## 2018-03-06 NOTE — Discharge Instructions (Addendum)
1. Medications: usual home medications 2. Treatment: rest, drink plenty of fluids,  3. Follow Up: Please followup with your primary doctor in 2-3 days for discussion of your diagnoses and further evaluation after today's visit; if you do not have a primary care doctor use the resource guide provided to find one; Please return to the ER for fever, chest pain, worsening pain or other concerns

## 2018-03-06 NOTE — ED Notes (Signed)
Pt departed in NAD, escorted by this RN to front lobby in wheelchair.

## 2018-03-11 ENCOUNTER — Other Ambulatory Visit: Payer: Self-pay

## 2018-03-11 ENCOUNTER — Encounter (HOSPITAL_COMMUNITY): Payer: Self-pay | Admitting: Emergency Medicine

## 2018-03-11 DIAGNOSIS — D5700 Hb-SS disease with crisis, unspecified: Secondary | ICD-10-CM | POA: Insufficient documentation

## 2018-03-11 DIAGNOSIS — M79601 Pain in right arm: Secondary | ICD-10-CM | POA: Insufficient documentation

## 2018-03-11 DIAGNOSIS — M79602 Pain in left arm: Secondary | ICD-10-CM | POA: Diagnosis not present

## 2018-03-11 NOTE — ED Triage Notes (Signed)
Pt is c/o sickle cell pain in her arms, legs and back that started a few days ago

## 2018-03-12 ENCOUNTER — Emergency Department (HOSPITAL_COMMUNITY)
Admission: EM | Admit: 2018-03-12 | Discharge: 2018-03-12 | Disposition: A | Discharge status: Home / Self Care | Payer: Medicaid Other | Attending: Emergency Medicine | Admitting: Emergency Medicine

## 2018-03-12 DIAGNOSIS — D5700 Hb-SS disease with crisis, unspecified: Secondary | ICD-10-CM

## 2018-03-12 LAB — COMPREHENSIVE METABOLIC PANEL
ALBUMIN: 4.2 g/dL (ref 3.5–5.0)
ALK PHOS: 60 U/L (ref 38–126)
ALT: 11 U/L — ABNORMAL LOW (ref 14–54)
AST: 16 U/L (ref 15–41)
Anion gap: 9 (ref 5–15)
BILIRUBIN TOTAL: 1.7 mg/dL — AB (ref 0.3–1.2)
BUN: 12 mg/dL (ref 6–20)
CALCIUM: 9.1 mg/dL (ref 8.9–10.3)
CO2: 26 mmol/L (ref 22–32)
Chloride: 106 mmol/L (ref 101–111)
Creatinine, Ser: 0.59 mg/dL (ref 0.44–1.00)
GFR calc Af Amer: >60 mL/min (ref >60)
GFR calc non Af Amer: >60 mL/min (ref >60)
GLUCOSE: 105 mg/dL — AB (ref 65–99)
Potassium: 3.5 mmol/L (ref 3.5–5.1)
Sodium: 141 mmol/L (ref 135–145)
TOTAL PROTEIN: 7.8 g/dL (ref 6.5–8.1)

## 2018-03-12 LAB — CBC WITH DIFFERENTIAL/PLATELET
BASOS ABS: 0 10*3/uL (ref 0.0–0.1)
Basophils Relative: 0 %
EOS PCT: 2 %
Eosinophils Absolute: 0.1 10*3/uL (ref 0.0–0.7)
HEMATOCRIT: 30.2 % — AB (ref 36.0–46.0)
Hemoglobin: 10.6 g/dL — ABNORMAL LOW (ref 12.0–15.0)
LYMPHS PCT: 34 %
Lymphs Abs: 2.1 10*3/uL (ref 0.7–4.0)
MCH: 23.8 pg — ABNORMAL LOW (ref 26.0–34.0)
MCHC: 35.1 g/dL (ref 30.0–36.0)
MCV: 67.9 fL — AB (ref 78.0–100.0)
MONO ABS: 0.6 10*3/uL (ref 0.1–1.0)
Monocytes Relative: 10 %
NEUTROS ABS: 3.3 10*3/uL (ref 1.7–7.7)
Neutrophils Relative %: 54 %
Platelets: 161 10*3/uL (ref 150–400)
RBC: 4.45 MIL/uL (ref 3.87–5.11)
RDW: 18.8 % — AB (ref 11.5–15.5)
WBC: 6.2 10*3/uL (ref 4.0–10.5)

## 2018-03-12 LAB — RETICULOCYTES
RBC.: 4.45 MIL/uL (ref 3.87–5.11)
Retic Count, Absolute: 115.7 10*3/uL (ref 19.0–186.0)
Retic Ct Pct: 2.6 % (ref 0.4–3.1)

## 2018-03-12 MED ORDER — HYDROMORPHONE HCL 1 MG/ML IJ SOLN
0.5000 mg | INTRAMUSCULAR | Status: AC
  Administered 2018-03-12: 0.5 mg via INTRAVENOUS
  Filled 2018-03-12: qty 1

## 2018-03-12 MED ORDER — HYDROMORPHONE HCL 1 MG/ML IJ SOLN: 1.0000 mg | INTRAMUSCULAR | Status: AC

## 2018-03-12 MED ORDER — KETOROLAC TROMETHAMINE 15 MG/ML IJ SOLN
15.0000 mg | INTRAMUSCULAR | Status: AC
  Administered 2018-03-12: 15 mg via INTRAVENOUS
  Filled 2018-03-12: qty 1

## 2018-03-12 MED ORDER — HYDROMORPHONE HCL 1 MG/ML IJ SOLN
1.0000 mg | INTRAMUSCULAR | Status: AC
  Administered 2018-03-12: 1 mg via INTRAVENOUS
  Filled 2018-03-12: qty 1

## 2018-03-12 MED ORDER — ONDANSETRON 8 MG PO TBDP
8.0000 mg | ORAL_TABLET | Freq: Once | ORAL | Status: AC
  Administered 2018-03-12: 8 mg via ORAL
  Filled 2018-03-12: qty 1

## 2018-03-12 MED ORDER — HYDROMORPHONE HCL 1 MG/ML IJ SOLN: 0.5000 mg | INTRAMUSCULAR | Status: AC

## 2018-03-12 NOTE — ED Provider Notes (Signed)
[facility]   CSN: [id number] Arrival date & time: 03/11/18  1849     History   Chief Complaint Chief Complaint  Patient presents with  . Sickle Cell Pain Crisis    HPI Nicole Case is a 17 y.o. female.  Patient with a history of Sickle Cell [place] here with c/o typical pain in LUE, LLE described as throbbing. No swelling, pain specific to joint or injury. No fever, weakness or numbness. She denies CP, SOB or cough.   The history is provided by the patient. No language interpreter was used.  Sickle Cell Pain Crisis  Associated symptoms: no chest pain, no cough, no fever, no shortness of breath and no vomiting     Past Medical History:  Diagnosis Date  . Sickle cell anemia (HCC)   . Sickle cell anemia [facility])     Patient Active Problem List   Diagnosis Date Noted  . Sickle-cell disease with pain (HCC) 08/01/2017  . Sickle cell disease, type [place], with unspecified crisis (HCC) 07/31/2017  . Sickle cell crisis (HCC) 05/28/2016  . 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 Sickle cell pain crisis (HCC) 01/29/2014  . Sickle cell pain crisis 12/12/2011    Past Surgical History:  Procedure Laterality Date  . NO PAST SURGERIES       OB History   None      Home Medications    Prior to Admission medications   Medication Sig Start Date End Date Taking? Authorizing Provider  ibuprofen (ADVIL,MOTRIN) 800 MG tablet Take 0.5 tablets (400 mg total) by mouth every 6 (six) hours. Patient taking differently: Take 800 mg by mouth every 6 (six) hours as needed for mild pain.  01/27/17  Yes Gardenia Phlegm, MD  oxyCODONE (OXY IR/ROXICODONE) 5 MG immediate release tablet Take 1 tablet (5 mg total) by mouth every 6 (six) hours as needed for moderate pain or severe pain. 07/26/17  Yes Lacroce, Ames Coupe, MD  acetaminophen (TYLENOL) 325 MG tablet Take 1.5 tablets (487.5 mg total) by mouth every 6 (six) hours. Patient not taking: Reported on  03/05/2018 01/27/17   Gardenia Phlegm, MD    Family History Family History  Problem Relation Age of Onset  . Sickle cell anemia Brother     Social History Social History   Tobacco Use  . Smoking status: Never Smoker  . Smokeless tobacco: Never Used  Substance Use Topics  . Alcohol use: No  . Drug use: No     Allergies   Patient has no known allergies.   Review of Systems Review of Systems  Constitutional: Negative for chills and fever.  HENT: Negative.   Respiratory: Negative.  Negative for cough and shortness of breath.   Cardiovascular: Negative.  Negative for chest pain.  Gastrointestinal: Negative.  Negative for abdominal pain and vomiting.  Musculoskeletal:       See HPI.  Skin: Negative.   Neurological: Negative.  Negative for weakness and numbness.     Physical Exam Updated Vital Signs BP 99/83 (BP Location: Left Arm)   Pulse 84   Temp 98.8 F (37.1 C) (Oral)   Resp 16   Ht 5\' 7"  (1.702 m)   Wt 94.1 kg (207 lb 6 oz)   LMP 03/03/2018   SpO2 98%   BMI 32.48 kg/m   Physical Exam  Constitutional: She is oriented to person, place, and time. She appears well-developed and well-nourished.  HENT:  Head: Normocephalic.  Neck: Normal range  of motion. Neck supple.  Cardiovascular: Normal rate and regular rhythm.  Pulmonary/Chest: Effort normal and breath sounds normal. She has no wheezes. She has no rales.  Abdominal: Soft. Bowel sounds are normal. There is no tenderness. There is no rebound and no guarding.  Musculoskeletal: Normal range of motion. She exhibits tenderness (LUE, LLE tenderness without swelling, warmth or discoloration). She exhibits no edema.  Neurological: She is alert and oriented to person, place, and time.  Skin: Skin is warm and dry. No rash noted.  Psychiatric: She has a normal mood and affect.     ED Treatments / Results  Labs (all labs ordered are listed, but only abnormal results are displayed) Labs Reviewed - No data to  display  EKG None  Radiology No results found.  Procedures Procedures (including critical care time)  Medications Ordered in ED Medications - No data to display   Initial Impression / Assessment and Plan / ED Course  I have reviewed the triage vital signs and the nursing notes.  Pertinent labs & imaging results that were available during my care of the patient were reviewed by me and considered in my medical decision making (see chart for details).     Patient presents with pain typical of sickle cell crisis. No CP, SOB or fever.   Pain medications provided by protocol with resolution of symptoms. She reports she is comfortable for discharge home. Encouraged to see PCP this week for recheck.   Final Clinical Impressions(s) / ED Diagnoses   Final diagnoses:  None   1. Sickle cell anemia with pain  ED Discharge Orders    None       Elpidio AnisUpstill, Jerald Hennington, PA-C 03/13/18 0542    Molpus, Jonny RuizJohn, MD 03/13/18 [phone]

## 2018-04-23 ENCOUNTER — Encounter (HOSPITAL_COMMUNITY): Payer: Self-pay | Admitting: Emergency Medicine

## 2018-04-23 ENCOUNTER — Emergency Department (HOSPITAL_COMMUNITY)
Admission: EM | Admit: 2018-04-23 | Discharge: 2018-04-24 | Disposition: A | Discharge status: Home / Self Care | Payer: Medicaid Other | Attending: Emergency Medicine | Admitting: Emergency Medicine

## 2018-04-23 DIAGNOSIS — D57219 Sickle-cell/Hb-C disease with crisis, unspecified: Secondary | ICD-10-CM | POA: Insufficient documentation

## 2018-04-23 DIAGNOSIS — S8012XA Contusion of left lower leg, initial encounter: Secondary | ICD-10-CM

## 2018-04-23 DIAGNOSIS — D5700 Hb-SS disease with crisis, unspecified: Secondary | ICD-10-CM

## 2018-04-23 DIAGNOSIS — Z79899 Other long term (current) drug therapy: Secondary | ICD-10-CM | POA: Insufficient documentation

## 2018-04-23 DIAGNOSIS — S8011XA Contusion of right lower leg, initial encounter: Secondary | ICD-10-CM
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
## 2018-04-24 LAB — CBC WITH DIFFERENTIAL/PLATELET
BASOS ABS: 0 10*3/uL (ref 0.0–0.1)
Basophils Relative: 0 %
Eosinophils Absolute: 0.1 10*3/uL (ref 0.0–0.7)
Eosinophils Relative: 1 %
HCT: 29.9 % — ABNORMAL LOW (ref 36.0–46.0)
HEMOGLOBIN: 10.5 g/dL — AB (ref 12.0–15.0)
LYMPHS PCT: 25 %
Lymphs Abs: 2.1 10*3/uL (ref 0.7–4.0)
MCH: 23.5 pg — ABNORMAL LOW (ref 26.0–34.0)
MCHC: 35.1 g/dL (ref 30.0–36.0)
MCV: 66.9 fL — ABNORMAL LOW (ref 78.0–100.0)
MONO ABS: 0.7 10*3/uL (ref 0.1–1.0)
Monocytes Relative: 8 %
NEUTROS PCT: 66 %
Neutro Abs: 5.5 10*3/uL (ref 1.7–7.7)
PLATELETS: 163 10*3/uL (ref 150–400)
RBC: 4.47 MIL/uL (ref 3.87–5.11)
RDW: 18.7 % — ABNORMAL HIGH (ref 11.5–15.5)
WBC: 8.4 10*3/uL (ref 4.0–10.5)

## 2018-04-24 LAB — COMPREHENSIVE METABOLIC PANEL
ALBUMIN: 4.1 g/dL (ref 3.5–5.0)
ALK PHOS: 54 U/L (ref 38–126)
ALT: 18 U/L (ref 14–54)
ANION GAP: 8 (ref 5–15)
AST: 22 U/L (ref 15–41)
BILIRUBIN TOTAL: 1.7 mg/dL — AB (ref 0.3–1.2)
BUN: 10 mg/dL (ref 6–20)
CALCIUM: 9.2 mg/dL (ref 8.9–10.3)
CO2: 22 mmol/L (ref 22–32)
Chloride: 108 mmol/L (ref 101–111)
Creatinine, Ser: 0.68 mg/dL (ref 0.44–1.00)
GFR calc non Af Amer: >60 mL/min (ref >60)
GLUCOSE: 108 mg/dL — AB (ref 65–99)
POTASSIUM: 3.4 mmol/L — AB (ref 3.5–5.1)
SODIUM: 138 mmol/L (ref 135–145)
TOTAL PROTEIN: 7.5 g/dL (ref 6.5–8.1)

## 2018-04-24 LAB — RETICULOCYTES
RBC.: 4.47 MIL/uL (ref 3.87–5.11)
Retic Count, Absolute: 107.3 10*3/uL (ref 19.0–186.0)
Retic Ct Pct: 2.4 % (ref 0.4–3.1)

## 2018-04-24 MED ORDER — HYDROMORPHONE HCL 2 MG/ML IJ SOLN
1.0000 mg | Freq: Once | INTRAMUSCULAR | Status: AC
  Administered 2018-04-24: 1 mg via INTRAVENOUS
  Filled 2018-04-24: qty 1

## 2018-04-24 MED ORDER — KETOROLAC TROMETHAMINE 30 MG/ML IJ SOLN
30.0000 mg | Freq: Once | INTRAMUSCULAR | Status: AC
  Administered 2018-04-24: 30 mg via INTRAVENOUS
  Filled 2018-04-24: qty 1

## 2018-04-24 NOTE — ED Notes (Signed)
Patient verbalizes understanding of discharge instructions. Opportunity for questioning and answers were provided. Armband removed by staff, pt discharged from ED. E signature not available.  
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
## 2018-05-27 ENCOUNTER — Encounter (HOSPITAL_COMMUNITY): Payer: Self-pay | Admitting: Emergency Medicine

## 2018-05-27 ENCOUNTER — Emergency Department (HOSPITAL_COMMUNITY)
Admission: EM | Admit: 2018-05-27 | Discharge: 2018-05-28 | Disposition: A | Discharge status: Home / Self Care | Payer: Medicaid Other | Attending: Emergency Medicine | Admitting: Emergency Medicine

## 2018-05-27 ENCOUNTER — Other Ambulatory Visit: Payer: Self-pay

## 2018-05-27 DIAGNOSIS — G43809 Other migraine, not intractable, without status migrainosus: Secondary | ICD-10-CM | POA: Insufficient documentation

## 2018-05-27 DIAGNOSIS — Z79899 Other long term (current) drug therapy: Secondary | ICD-10-CM | POA: Diagnosis not present

## 2018-05-27 DIAGNOSIS — D5700 Hb-SS disease with crisis, unspecified: Secondary | ICD-10-CM

## 2018-05-27 DIAGNOSIS — M79601 Pain in right arm: Secondary | ICD-10-CM | POA: Diagnosis present

## 2018-05-27 DIAGNOSIS — R51 Headache: Secondary | ICD-10-CM

## 2018-05-27 DIAGNOSIS — D571 Sickle-cell disease without crisis: Secondary | ICD-10-CM | POA: Insufficient documentation

## 2018-05-27 DIAGNOSIS — R519 Headache, unspecified: Secondary | ICD-10-CM

## 2018-05-27 LAB — CBC WITH DIFFERENTIAL/PLATELET
BASOS ABS: 0 10*3/uL (ref 0.0–0.1)
BASOS PCT: 0 %
EOS ABS: 0.1 10*3/uL (ref 0.0–0.7)
Eosinophils Relative: 1 %
HCT: 29 % — ABNORMAL LOW (ref 36.0–46.0)
Hemoglobin: 10.1 g/dL — ABNORMAL LOW (ref 12.0–15.0)
LYMPHS ABS: 1.7 10*3/uL (ref 0.7–4.0)
Lymphocytes Relative: 30 %
MCH: 23.8 pg — AB (ref 26.0–34.0)
MCHC: 34.8 g/dL (ref 30.0–36.0)
MCV: 68.4 fL — ABNORMAL LOW (ref 78.0–100.0)
MONO ABS: 0.4 10*3/uL (ref 0.1–1.0)
Monocytes Relative: 7 %
NEUTROS ABS: 3.3 10*3/uL (ref 1.7–7.7)
Neutrophils Relative %: 62 %
PLATELETS: 151 10*3/uL (ref 150–400)
RBC: 4.24 MIL/uL (ref 3.87–5.11)
RDW: 18.1 % — AB (ref 11.5–15.5)
WBC: 5.5 10*3/uL (ref 4.0–10.5)

## 2018-05-27 LAB — I-STAT BETA HCG BLOOD, ED (MC, WL, AP ONLY): I-stat hCG, quantitative: <5 m[IU]/mL (ref <5)

## 2018-05-27 LAB — COMPREHENSIVE METABOLIC PANEL
ALBUMIN: 3.9 g/dL (ref 3.5–5.0)
ALK PHOS: 52 U/L (ref 38–126)
ALT: 12 U/L — AB (ref 14–54)
AST: 15 U/L (ref 15–41)
Anion gap: 7 (ref 5–15)
BILIRUBIN TOTAL: 1.8 mg/dL — AB (ref 0.3–1.2)
BUN: 10 mg/dL (ref 6–20)
CALCIUM: 9.2 mg/dL (ref 8.9–10.3)
CO2: 25 mmol/L (ref 22–32)
CREATININE: 0.66 mg/dL (ref 0.44–1.00)
Chloride: 109 mmol/L (ref 101–111)
GFR calc Af Amer: >60 mL/min (ref >60)
GFR calc non Af Amer: >60 mL/min (ref >60)
GLUCOSE: 105 mg/dL — AB (ref 65–99)
Potassium: 3.6 mmol/L (ref 3.5–5.1)
Sodium: 141 mmol/L (ref 135–145)
TOTAL PROTEIN: 6.8 g/dL (ref 6.5–8.1)

## 2018-05-27 LAB — RETICULOCYTES
RBC.: 4.24 MIL/uL (ref 3.87–5.11)
RETIC CT PCT: 2.7 % (ref 0.4–3.1)
Retic Count, Absolute: 114.5 10*3/uL (ref 19.0–186.0)

## 2018-05-27 MED ORDER — OXYCODONE-ACETAMINOPHEN 5-325 MG PO TABS
1.5000 | ORAL_TABLET | Freq: Once | ORAL | Status: AC
  Administered 2018-05-27: 1.5 via ORAL
  Filled 2018-05-27: qty 2

## 2018-05-27 MED ORDER — KETOROLAC TROMETHAMINE 60 MG/2ML IM SOLN
30.0000 mg | Freq: Once | INTRAMUSCULAR | Status: AC
  Administered 2018-05-27: 30 mg via INTRAMUSCULAR
  Filled 2018-05-27: qty 2

## 2018-05-27 NOTE — ED Triage Notes (Signed)
Patient to ED c/o sickle cell pain in her R arm x 2 days. Patient denies CP/SOB, no fevers/chills. Patient in no apparent distress.

## 2018-05-27 NOTE — ED Provider Notes (Signed)
[facility] Provider Note  CSN: [id number] Arrival date & time: 05/27/18 1821  Chief Complaint(s) Sickle Cell Pain Crisis  HPI Nicole Case is a 17 y.o. female [facility] sick[facility] cell [facility] who presents to the emergency department with right upper extremity pain typical for her sickle cell pain.  Is been ongoing for 2 days.  Has tried taking Motrin and 2 doses of Roxicodone with minimal relief.  No other alleviating or aggravating factors.  In addition she does endorse mild frontal headache that is been ongoing for several days.  No rhinorrhea, fevers, visual changes, focal weakness.  She denies any chest pain or shortness of breath.  No nausea or vomiting.  No extremity swelling or trauma.  No lower extremity pain.  No back pain.  No abdominal pain.  She denies any other physical complaints.   Sickle Cell Pain Crisis    Past Medical History Past Medical History:  Diagnosis Date  . Sickle cell anemia (HCC)   . Sickle cell anemia [facility](HCC)    Patient Active Problem List   Diagnosis Date Noted  . Sickle-cell disease with pain (HCC) 08/01/2017  . Sickle cell disease, type [place], with unspecified crisis (HCC) 07/31/2017  . Sickle cell crisis (HCC) 05/28/2016  . 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 Sickle cell pain crisis (HCC) 01/29/2014  . Sickle cell pain crisis 12/12/2011   Home Medication(s) Prior to Admission medications   Medication Sig Start Date End Date Taking? Authorizing Provider  ibuprofen (ADVIL,MOTRIN) 800 MG tablet Take 0.5 tablets (400 mg total) by mouth every 6 (six) hours. Patient taking differently: Take 800 mg by mouth every 6 (six) hours as needed for mild pain.  01/27/17  Yes Gardenia PhlegmMoore, Melissa Kepke, MD  oxyCODONE (OXY IR/ROXICODONE) 5 MG immediate release tablet Take 1 tablet (5 mg total) by mouth every 6 (six) hours as needed for moderate pain or severe pain. 07/26/17  Yes Lacroce, Ames CoupeSamantha J, MD  acetaminophen  (TYLENOL) 325 MG tablet Take 1.5 tablets (487.5 mg total) by mouth every 6 (six) hours. Patient not taking: Reported on 03/05/2018 01/27/17   Gardenia PhlegmMoore, Melissa Kepke, MD                                                                                                                                    Past Surgical History Past Surgical History:  Procedure Laterality Date  . NO PAST SURGERIES     Family History Family History  Problem Relation Age of Onset  . Sickle cell anemia Brother     Social History Social History   Tobacco Use  . Smoking status: Never Smoker  . Smokeless tobacco: Never Used  Substance Use Topics  . Alcohol use: No  . Drug use: No   Allergies Patient has no known allergies.  Review of Systems Review of Systems All other systems are reviewed and are negative for acute  change except as noted in the HPI  Physical Exam Vital Signs  I have reviewed the triage vital signs BP 125/78   Pulse 83   Temp 99 F (37.2 C)   Resp 18   LMP 05/20/2018 (Exact Date)   SpO2 100%   Physical Exam  Constitutional: She is oriented to person, place, and time. She appears well-developed and well-nourished. No distress.  HENT:  Head: Normocephalic and atraumatic.  Nose: Nose normal.  Eyes: Pupils are equal, round, and reactive to light. Conjunctivae and EOM are normal. Right eye exhibits no discharge. Left eye exhibits no discharge. No scleral icterus.  Neck: Normal range of motion. Neck supple.  Cardiovascular: Normal rate and regular rhythm. Exam reveals no gallop and no friction rub.  No murmur heard. Pulmonary/Chest: Effort normal and breath sounds normal. No stridor. No respiratory distress. She has no rales.  Abdominal: Soft. She exhibits no distension. There is no tenderness.  Musculoskeletal: She exhibits no edema or tenderness.  Neurological: She is alert and oriented to person, place, and time.  Mental Status:  Alert and oriented to person, place, and time.    Attention and concentration normal.  Speech clear.  Recent memory is intact  Cranial Nerves:  II Visual Fields: Intact to confrontation. Visual fields intact. III, IV, VI: Pupils equal and reactive to light and near. Full eye movement without nystagmus  V Facial Sensation: Normal. No weakness of masticatory muscles  VII: No facial weakness or asymmetry  VIII Auditory Acuity: Grossly normal  IX/X: The uvula is midline; the palate elevates symmetrically  XI: Normal sternocleidomastoid and trapezius strength  XII: The tongue is midline. No atrophy or fasciculations.   Motor System: Muscle Strength: 5/5 and symmetric in the upper and lower extremities. No pronation or drift.  Muscle Tone: Tone and muscle bulk are normal in the upper and lower extremities.   Reflexes: DTRs: 1+ and symmetrical in all four extremities. No Clonus Coordination: Intact finger-to-nose, heel-to-shin. No tremor.  Sensation: Intact to light touch, and pinprick. Negative Romberg test.  Gait: Routine gait normal.   Skin: Skin is warm and dry. No rash noted. She is not diaphoretic. No erythema.  Psychiatric: She has a normal mood and affect.  Vitals reviewed.   ED Results and Treatments Labs (all labs ordered are listed, but only abnormal results are displayed) Labs Reviewed  COMPREHENSIVE METABOLIC PANEL - Abnormal; Notable for the following components:      Result Value   Glucose, Bld 105 (*)    ALT 12 (*)    Total Bilirubin 1.8 (*)    All other components within normal limits  CBC WITH DIFFERENTIAL/PLATELET - Abnormal; Notable for the following components:   Hemoglobin 10.1 (*)    HCT 29.0 (*)    MCV 68.4 (*)    MCH 23.8 (*)    RDW 18.1 (*)    All other components within normal limits  RETICULOCYTES  I-STAT BETA HCG BLOOD, ED ([facility], [facility], [facility] ONLY)  EKG  EKG  Interpretation  Date/Time:    Ventricular Rate:    PR Interval:    QRS Duration:   QT Interval:    QTC Calculation:   R Axis:     Text Interpretation:        Radiology No results found. Pertinent labs & imaging results that were available during my care of the patient were reviewed by me and considered in my medical decision making (see chart for details).  Medications Ordered in ED Medications  ketorolac (TORADOL) injection 30 mg (30 mg Intramuscular Given 05/27/18 2332)  oxyCODONE-acetaminophen (PERCOCET/ROXICET) 5-325 MG per tablet 1.5 tablet (1.5 tablets Oral Given 05/27/18 2333)                                                                                                                                    Procedures Procedures  (including critical care time)  Medical Decision Making / ED Course I have reviewed the nursing notes for this encounter and the patient's prior records (if available in EHR or on provided paperwork).    Right upper extremity pain typical for her sickle cell pain.  Labs grossly reassuring without evidence of aplastic or hemolytic crisis.  No extremity swelling concerning for DVT.  Pulses intact.  Non focal neuro exam. No recent head trauma. No fever. Doubt meningitis. Doubt intracranial bleed. Doubt IIH. No indication for imaging.   Patient provided with IM Toradol and oral Roxicodon resulting in improved right upper extremity pain and headache.   The patient appears reasonably screened and/or stabilized for discharge and I doubt any other medical condition or other [facility] requiring further screening, evaluation, or treatment in the ED at this time prior to discharge.  The patient is safe for discharge with strict return precautions.  Final Clinical Impression(s) / ED Diagnoses Final diagnoses:  Sickle cell anemia with pain (HCC)  Acute nonintractable headache, unspecified headache type   Disposition: Discharge  Condition: Good  I have  discussed the results, Dx and Tx plan with the patient who expressed understanding and agree(s) with the plan. Discharge instructions discussed at great length. The patient was given strict return precautions who verbalized understanding of the instructions. No further questions at time of discharge.    ED Discharge Orders    None       Follow Up: Peds, [facility]  Schedule an appointment as soon as possible for a visit  As needed      This chart was dictated using voice recognition software.  Despite best efforts to proofread,  errors can occur which can change the documentation meaning.   Nira Conn, MD 05/28/18 [phone]

## 2018-05-27 NOTE — ED Notes (Signed)
Patient found in lobby after calling for third time. Patient sitting at the vending machines, eating food. In no apparent distress - brought back for triage at this time.

## 2018-05-27 NOTE — ED Notes (Signed)
Called patient to move to Triage  room. Unable to locate patient at this time. 

## 2018-06-11 ENCOUNTER — Emergency Department (HOSPITAL_COMMUNITY)
Admission: EM | Admit: 2018-06-11 | Discharge: 2018-06-12 | Disposition: A | Discharge status: Home / Self Care | Payer: Medicaid Other | Attending: Emergency Medicine | Admitting: Emergency Medicine

## 2018-06-11 ENCOUNTER — Other Ambulatory Visit: Payer: Self-pay

## 2018-06-11 ENCOUNTER — Emergency Department (HOSPITAL_COMMUNITY): Payer: Medicaid Other

## 2018-06-11 DIAGNOSIS — D57219 Sickle-cell/Hb-C disease with crisis, unspecified: Secondary | ICD-10-CM | POA: Insufficient documentation

## 2018-06-11 DIAGNOSIS — Z79899 Other long term (current) drug therapy: Secondary | ICD-10-CM | POA: Diagnosis not present

## 2018-06-11 DIAGNOSIS — R0789 Other chest pain: Secondary | ICD-10-CM

## 2018-06-11 DIAGNOSIS — D5700 Hb-SS disease with crisis, unspecified: Secondary | ICD-10-CM | POA: Diagnosis not present

## 2018-06-11 DIAGNOSIS — R079 Chest pain, unspecified: Secondary | ICD-10-CM | POA: Diagnosis not present

## 2018-06-11 LAB — BASIC METABOLIC PANEL
ANION GAP: 8 (ref 5–15)
BUN: 8 mg/dL (ref 6–20)
CHLORIDE: 106 mmol/L (ref 98–111)
CO2: 27 mmol/L (ref 22–32)
Calcium: 9.1 mg/dL (ref 8.9–10.3)
Creatinine, Ser: 0.63 mg/dL (ref 0.44–1.00)
GFR calc non Af Amer: >60 mL/min (ref >60)
GLUCOSE: 94 mg/dL (ref 70–99)
POTASSIUM: 4.1 mmol/L (ref 3.5–5.1)
Sodium: 141 mmol/L (ref 135–145)

## 2018-06-11 LAB — RETICULOCYTES
RBC.: 4.49 MIL/uL (ref 3.87–5.11)
RETIC CT PCT: 2 % (ref 0.4–3.1)
Retic Count, Absolute: 89.8 10*3/uL (ref 19.0–186.0)

## 2018-06-11 LAB — CBC
HEMATOCRIT: 31 % — AB (ref 36.0–46.0)
Hemoglobin: 10.7 g/dL — ABNORMAL LOW (ref 12.0–15.0)
MCH: 23.8 pg — ABNORMAL LOW (ref 26.0–34.0)
MCHC: 34.5 g/dL (ref 30.0–36.0)
MCV: 69 fL — AB (ref 78.0–100.0)
Platelets: 161 10*3/uL (ref 150–400)
RBC: 4.49 MIL/uL (ref 3.87–5.11)
RDW: 18.3 % — ABNORMAL HIGH (ref 11.5–15.5)
WBC: 6.5 10*3/uL (ref 4.0–10.5)

## 2018-06-11 LAB — I-STAT TROPONIN, ED: TROPONIN I, POC: 0 ng/mL (ref 0.00–0.08)

## 2018-06-11 LAB — D-DIMER, QUANTITATIVE (NOT AT ARMC)

## 2018-06-11 LAB — TROPONIN I: Troponin I: <0.03 ng/mL (ref <0.03)

## 2018-06-11 LAB — I-STAT BETA HCG BLOOD, ED (MC, WL, AP ONLY): I-stat hCG, quantitative: <5 m[IU]/mL (ref <5)

## 2018-06-11 MED ORDER — HYDROMORPHONE HCL 1 MG/ML IJ SOLN: 1.0000 mg | INTRAMUSCULAR | Status: DC

## 2018-06-11 MED ORDER — HYDROMORPHONE HCL 1 MG/ML IJ SOLN
0.5000 mg | INTRAMUSCULAR | Status: AC
  Administered 2018-06-11: 0.5 mg via INTRAVENOUS

## 2018-06-11 MED ORDER — HYDROMORPHONE HCL 1 MG/ML IJ SOLN: 1.0000 mg | INTRAMUSCULAR | Status: AC

## 2018-06-11 MED ORDER — DEXTROSE-NACL 5-0.45 % IV SOLN
INTRAVENOUS | Status: DC
  Administered 2018-06-11: 22:00:00 via INTRAVENOUS

## 2018-06-11 MED ORDER — HYDROMORPHONE HCL 1 MG/ML IJ SOLN
1.0000 mg | INTRAMUSCULAR | Status: AC
  Filled 2018-06-11: qty 1

## 2018-06-11 MED ORDER — HYDROMORPHONE HCL 1 MG/ML IJ SOLN
0.5000 mg | INTRAMUSCULAR | Status: AC
  Filled 2018-06-11: qty 1

## 2018-06-11 MED ORDER — HYDROMORPHONE HCL 1 MG/ML IJ SOLN
1.0000 mg | INTRAMUSCULAR | Status: AC
  Administered 2018-06-11: 1 mg via INTRAVENOUS
  Filled 2018-06-11: qty 1

## 2018-06-11 MED ORDER — KETOROLAC TROMETHAMINE 15 MG/ML IJ SOLN
15.0000 mg | INTRAMUSCULAR | Status: AC
  Administered 2018-06-11: 15 mg via INTRAVENOUS
  Filled 2018-06-11: qty 1

## 2018-06-11 MED ORDER — OXYCODONE HCL 5 MG PO TABS: 5.0000 mg | ORAL_TABLET | Freq: Four times a day (QID) | ORAL | 0 refills | Status: DC | PRN

## 2018-06-11 NOTE — ED Provider Notes (Signed)
[facility] EMERGENCY DEPARTMENT Provider Note   CSN: [id number] Arrival date & time: 06/11/18  1757     History   Chief Complaint Chief Complaint  Patient presents with  . Sickle Cell Pain Crisis  . Chest Pain    HPI Nicole Case is a 17 y.o. female.  HPI   17 year old female with history of sickle cell here with chest pain and arm pain.  Patient states that over the last several days, she is a progressive worsening aching, throbbing, right leg and back pain.  This is similar to her usual pain crisis.  However, over the last 1 to 2 days, she is developed a sharp, positional, reproducible anterior chest pain.  She denies any cough or shortness of breath.  No sputum production or hemoptysis.  She states this is somewhat atypical for her and her pain crises.  No fevers or chills.  Denies any nausea, vomiting, or abdominal pain.  She is been taking her medications as prescribed.  She sees [facility] for her sickle cell.  Past Medical History:  Diagnosis Date  . Sickle cell anemia (HCC)   . Sickle cell anemia [facility])     Patient Active Problem List   Diagnosis Date Noted  . Sickle-cell disease with pain (HCC) 08/01/2017  . Sickle cell disease, type [place], with unspecified crisis (HCC) 07/31/2017  . Sickle cell crisis (HCC) 05/28/2016  . 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 Sickle cell pain crisis (HCC) 01/29/2014  . Sickle cell pain crisis 12/12/2011    Past Surgical History:  Procedure Laterality Date  . NO PAST SURGERIES       OB History   None      Home Medications    Prior to Admission medications   Medication Sig Start Date End Date Taking? Authorizing Provider  acetaminophen (TYLENOL) 325 MG tablet Take 1.5 tablets (487.5 mg total) by mouth every 6 (six) hours. 01/27/17  Yes Gardenia Phlegm, MD  ibuprofen (ADVIL,MOTRIN) 800 MG tablet Take 0.5 tablets (400 mg total) by mouth every 6 (six) hours. Patient taking differently: Take 800 mg by mouth  every 6 (six) hours as needed for mild pain.  01/27/17  Yes Gardenia Phlegm, MD  oxyCODONE (OXY IR/ROXICODONE) 5 MG immediate release tablet Take 1 tablet (5 mg total) by mouth every 6 (six) hours as needed for moderate pain or severe pain. 06/11/18   Rise Mu, PA-C    Family History Family History  Problem Relation Age of Onset  . Sickle cell anemia Brother     Social History Social History   Tobacco Use  . Smoking status: Never Smoker  . Smokeless tobacco: Never Used  Substance Use Topics  . Alcohol use: No  . Drug use: No     Allergies   Patient has no known allergies.   Review of Systems Review of Systems  Constitutional: Positive for fatigue. Negative for chills and fever.  HENT: Negative for congestion, rhinorrhea and sore throat.   Eyes: Negative for visual disturbance.  Respiratory: Negative for cough, shortness of breath and wheezing.   Cardiovascular: Positive for chest pain. Negative for leg swelling.  Gastrointestinal: Negative for abdominal pain, diarrhea, nausea and vomiting.  Genitourinary: Negative for dysuria, flank pain, vaginal bleeding and vaginal discharge.  Musculoskeletal: Positive for arthralgias and myalgias. Negative for neck pain.  Skin: Negative for rash.  Allergic/Immunologic: Negative for immunocompromised state.  Neurological: Negative for syncope and headaches.  Hematological: Does not bruise/bleed easily.  All  other systems reviewed and are negative.    Physical Exam Updated Vital Signs BP (!) 117/95   Pulse 86   Temp 98.4 F (36.9 C) (Oral)   Resp 16   Ht 5\' 7"  (1.702 m)   Wt 86.2 kg (190 lb)   LMP 06/04/2018 (Approximate) Comment: "About 1 week ago" per pt  SpO2 99%   BMI 29.76 kg/m   Physical Exam  Constitutional: She is oriented to person, place, and time. She appears well-developed and well-nourished. No distress.  HENT:  Head: Normocephalic and atraumatic.  Eyes: Conjunctivae are normal.  Neck: Neck  supple.  Cardiovascular: Normal rate, regular rhythm and normal heart sounds. Exam reveals no friction rub.  No murmur heard. Mild TTP over anterior chest wall  Pulmonary/Chest: Effort normal and breath sounds normal. No respiratory distress. She has no wheezes. She has no rales.  Abdominal: She exhibits no distension.  Musculoskeletal: She exhibits no edema.  Diffuse TTP over paraspinal areas, lower legs b/l but no pinpoint TTP, no bruising, no edema  Neurological: She is alert and oriented to person, place, and time. She exhibits normal muscle tone.  Skin: Skin is warm. Capillary refill takes less than 2 seconds.  Psychiatric: She has a normal mood and affect.  Nursing note and vitals reviewed.    ED Treatments / Results  Labs (all labs ordered are listed, but only abnormal results are displayed) Labs Reviewed  CBC - Abnormal; Notable for the following components:      Result Value   Hemoglobin 10.7 (*)    HCT 31.0 (*)    MCV 69.0 (*)    MCH 23.8 (*)    RDW 18.3 (*)    All other components within normal limits  BASIC METABOLIC PANEL  RETICULOCYTES  TROPONIN I  D-DIMER, QUANTITATIVE (NOT AT [facility])  I-STAT TROPONIN, ED  I-STAT BETA HCG BLOOD, ED ([facility], [facility], [facility] ONLY)    EKG EKG Interpretation  Date/Time:  Tuesday June 11 2018 18:09:04 EDT Ventricular Rate:  90 PR Interval:  146 QRS Duration: 82 QT Interval:  342 QTC Calculation: 418 R Axis:   62 Text Interpretation:  Normal sinus rhythm Normal ECG No significant change since last tracing Confirmed by Shaune Pollack [phone]) on 06/11/2018 10:51:42 PM   Radiology Dg Chest 2 View  Result Date: 06/11/2018 CLINICAL DATA:  Central chest pain x3 days EXAM: CHEST - 2 VIEW COMPARISON:  07/23/2017 FINDINGS: The heart size and mediastinal contours are within normal limits. Both lungs are clear. The visualized skeletal structures are unremarkable. IMPRESSION: No active cardiopulmonary disease. Electronically Signed   By: Tollie Eth  M.D.   On: 06/11/2018 19:01    Procedures Procedures (including critical care time)  Medications Ordered in ED Medications  HYDROmorphone (DILAUDID) injection 1 mg (1 mg Intravenous Not Given 06/11/18 2341)    Or  HYDROmorphone (DILAUDID) injection 1 mg ( Subcutaneous See Alternative 06/11/18 2341)  HYDROmorphone (DILAUDID) injection 1 mg (has no administration in time range)    Or  HYDROmorphone (DILAUDID) injection 1 mg (has no administration in time range)  dextrose 5 %-0.45 % sodium chloride infusion ( Intravenous Stopped 06/12/18 0026)  ketorolac (TORADOL) 15 MG/ML injection 15 mg (15 mg Intravenous Given 06/11/18 2156)  HYDROmorphone (DILAUDID) injection 0.5 mg (0.5 mg Intravenous Given 06/11/18 2226)    Or  HYDROmorphone (DILAUDID) injection 0.5 mg ( Subcutaneous See Alternative 06/11/18 2226)  HYDROmorphone (DILAUDID) injection 1 mg (1 mg Intravenous Given 06/11/18 2309)    Or  HYDROmorphone (DILAUDID) injection 1 mg ( Subcutaneous See Alternative 06/11/18 2309)  ondansetron (ZOFRAN-ODT) disintegrating tablet 4 mg (4 mg Oral Given 06/12/18 0023)     Initial Impression / Assessment and Plan / ED Course  I have reviewed the triage vital signs and the nursing notes.  Pertinent labs & imaging results that were available during my care of the patient were reviewed by me and considered in my medical decision making (see chart for details).     17 yo F here with acute on chronic leg, back pain c/w her sickle crisis, also with CP. I suspect her CP is 2/2 sternal pain from crisis. EKG non-ischemic, trop neg x 2. She is not hypoxic, tachypneic, or tachycardic and denies any SOB, cough, or signs/sx of acute chest. Will check D-Dimer, though PE is less likely. Otherwise, her Hgb, labs are at her baseline per review of records. Will give fluids, analgesia, and re-assess.  Final Clinical Impressions(s) / ED Diagnoses   Final diagnoses:  Sickle cell pain crisis (HCC)  Atypical chest pain    ED  Discharge Orders        Ordered    oxyCODONE (OXY IR/ROXICODONE) 5 MG immediate release tablet  Every 6 hours PRN     06/11/18 2359       Shaune PollackIsaacs, Beryle Bagsby, MD 06/12/18 0222

## 2018-06-11 NOTE — Discharge Instructions (Addendum)
Your work-up has been very reassuring today.  Please continue take your pain medication at home.  Follow-up with your primary care doctor and hematologist.  Return to the ED with any worsening symptoms.

## 2018-06-11 NOTE — ED Provider Notes (Signed)
Care assumed from previous provider Dr. Erma HeritageIsaacs. Please see their note for further details to include full history and physical. To summarize in short pt is a 17 year old female history of sickle cell disease presents to the ED for sickle cell pain crisis and chest pain. Case discussed, plan agreed upon.  Awaiting d-dimer and repeat troponin.  Patient also repeated with dose of pain medication.  D-dimer was negative.  Troponin was negative.  Low suspicion for PE and ACS at this time.  Patient's pain after second dose of pain medication and Toradol improved to a 3/10.  Patient is requesting discharge.  Will give short course of her home narcotic pain medication and she will follow-up with her hematologist this week.  Patient's vital signs remained reassuring and she remains stable at this time.  Pt is hemodynamically stable, in NAD, & able to ambulate in the ED. Evaluation does not show pathology that would require ongoing emergent intervention or inpatient treatment. I explained the diagnosis to the patient. Pain has been managed & has no complaints prior to dc. Pt is comfortable with above plan and is stable for discharge at this time. All questions were answered prior to disposition. Strict return precautions for f/u to the ED were discussed. Encouraged follow up with PCP.        Rise MuLeaphart, Kimberly Nieland T, PA-C 06/12/18 0000    Shaune PollackIsaacs, Cameron, MD 06/12/18 [phone]

## 2018-06-11 NOTE — ED Provider Notes (Signed)
Patient placed in Quick Look pathway, seen and evaluated   Chief Complaint: sickle cell pain  HPI:   3 days of pain to arms, legs and now chest and back. Not relief with home pain meds.  No inciting factor  ROS: no fever, exertional cp or sob (one)  Physical Exam:   Gen: No distress  Neuro: Awake and Alert  Skin: Warm    Focused Exam: heart RRR no M/R/G, lungs CTAB. Mild tenderness to palpation of anterior chest.   Initiation of care has begun. The patient has been counseled on the process, plan, and necessity for staying for the completion/evaluation, and the remainder of the medical screening examination    Fayrene Helperran, Shirly Bartosiewicz, Cordelia Poche-C 06/11/18 1806    Eber HongMiller, Brian, MD 06/14/18 [phone]

## 2018-06-11 NOTE — ED Triage Notes (Signed)
Pt reports pain in her chest, back x 3 days. Pt reports pain to arms and legs that feels like sickle cell. Pt reports she feels her CP and back pain is not related to sickle cell. Pt reports the pain in her chest is sharp. Pt denies nausea/vomiting. Pt reports intermittent SHOB

## 2018-06-12 MED ORDER — ONDANSETRON 4 MG PO TBDP
4.0000 mg | ORAL_TABLET | Freq: Once | ORAL | Status: AC
Start: 2018-06-12 — End: 2018-06-12
  Administered 2018-06-12: 4 mg via ORAL
  Filled 2018-06-12: qty 1

## 2018-06-25 ENCOUNTER — Encounter (HOSPITAL_COMMUNITY): Payer: Self-pay

## 2018-06-25 ENCOUNTER — Emergency Department (HOSPITAL_COMMUNITY): Payer: Medicaid Other

## 2018-06-25 ENCOUNTER — Emergency Department (HOSPITAL_COMMUNITY)
Admission: EM | Admit: 2018-06-25 | Discharge: 2018-06-26 | Disposition: A | Discharge status: Home / Self Care | Payer: Medicaid Other | Attending: Emergency Medicine | Admitting: Emergency Medicine

## 2018-06-25 DIAGNOSIS — M79606 Pain in leg, unspecified: Secondary | ICD-10-CM | POA: Insufficient documentation

## 2018-06-25 DIAGNOSIS — M79603 Pain in arm, unspecified: Secondary | ICD-10-CM | POA: Insufficient documentation

## 2018-06-25 DIAGNOSIS — D5700 Hb-SS disease with crisis, unspecified: Secondary | ICD-10-CM | POA: Insufficient documentation

## 2018-06-25 DIAGNOSIS — R079 Chest pain, unspecified: Secondary | ICD-10-CM | POA: Diagnosis not present

## 2018-06-25 LAB — COMPREHENSIVE METABOLIC PANEL
ALBUMIN: 3.8 g/dL (ref 3.5–5.0)
ALT: 24 U/L (ref 0–44)
ANION GAP: 6 (ref 5–15)
AST: 19 U/L (ref 15–41)
Alkaline Phosphatase: 60 U/L (ref 38–126)
BUN: 7 mg/dL (ref 6–20)
CHLORIDE: 109 mmol/L (ref 98–111)
CO2: 26 mmol/L (ref 22–32)
Calcium: 9 mg/dL (ref 8.9–10.3)
Creatinine, Ser: 0.6 mg/dL (ref 0.44–1.00)
GFR calc Af Amer: >60 mL/min (ref >60)
GFR calc non Af Amer: >60 mL/min (ref >60)
GLUCOSE: 96 mg/dL (ref 70–99)
POTASSIUM: 3.7 mmol/L (ref 3.5–5.1)
SODIUM: 141 mmol/L (ref 135–145)
Total Bilirubin: 1.2 mg/dL (ref 0.3–1.2)
Total Protein: 7 g/dL (ref 6.5–8.1)

## 2018-06-25 LAB — CBC WITH DIFFERENTIAL/PLATELET
BASOS PCT: 1 %
Basophils Absolute: 0.1 10*3/uL (ref 0.0–0.1)
Eosinophils Absolute: 0.1 10*3/uL (ref 0.0–0.7)
Eosinophils Relative: 1 %
HEMATOCRIT: 29.6 % — AB (ref 36.0–46.0)
HEMOGLOBIN: 10.2 g/dL — AB (ref 12.0–15.0)
LYMPHS PCT: 31 %
Lymphs Abs: 2 10*3/uL (ref 0.7–4.0)
MCH: 24 pg — ABNORMAL LOW (ref 26.0–34.0)
MCHC: 34.5 g/dL (ref 30.0–36.0)
MCV: 69.6 fL — ABNORMAL LOW (ref 78.0–100.0)
MONOS PCT: 8 %
Monocytes Absolute: 0.5 10*3/uL (ref 0.1–1.0)
NEUTROS ABS: 3.9 10*3/uL (ref 1.7–7.7)
Neutrophils Relative %: 59 %
Platelets: 173 10*3/uL (ref 150–400)
RBC: 4.25 MIL/uL (ref 3.87–5.11)
RDW: 18.5 % — ABNORMAL HIGH (ref 11.5–15.5)
WBC: 6.6 10*3/uL (ref 4.0–10.5)

## 2018-06-25 LAB — I-STAT TROPONIN, ED: TROPONIN I, POC: 0 ng/mL (ref 0.00–0.08)

## 2018-06-25 LAB — I-STAT BETA HCG BLOOD, ED (MC, WL, AP ONLY): I-stat hCG, quantitative: <5 m[IU]/mL (ref <5)

## 2018-06-25 LAB — RETICULOCYTES
RBC.: 4.25 MIL/uL (ref 3.87–5.11)
Retic Count, Absolute: 119 10*3/uL (ref 19.0–186.0)
Retic Ct Pct: 2.8 % (ref 0.4–3.1)

## 2018-06-25 NOTE — ED Triage Notes (Signed)
Pt reports that for the past two days she has been having central CP and R lower leg pain, pt thinks pain is due to sickle cell. Denies other cardiac symptoms or injury to leg

## 2018-06-25 NOTE — ED Notes (Signed)
Pt called for vitals x2 

## 2018-06-26 MED ORDER — KETOROLAC TROMETHAMINE 30 MG/ML IJ SOLN
30.0000 mg | Freq: Once | INTRAMUSCULAR | Status: AC
  Administered 2018-06-26: 30 mg via INTRAVENOUS
  Filled 2018-06-26: qty 1

## 2018-06-26 MED ORDER — MORPHINE SULFATE (PF) 4 MG/ML IV SOLN
4.0000 mg | Freq: Once | INTRAVENOUS | Status: AC
  Administered 2018-06-26: 4 mg via INTRAVENOUS
  Filled 2018-06-26: qty 1

## 2018-06-26 MED ORDER — SODIUM CHLORIDE 0.9 % IV BOLUS
1000.0000 mL | Freq: Once | INTRAVENOUS | Status: AC
  Administered 2018-06-26: 1000 mL via INTRAVENOUS

## 2018-06-26 NOTE — ED Provider Notes (Signed)
[facility]   CSN: [id number] Arrival date & time: 06/25/18  2008     History   Chief Complaint Chief Complaint  Patient presents with  . Sickle Cell Pain Crisis    HPI Nicole Case is a 17 y.o. female.  HPI  This is a [age] year old female with a history of sickle cell anemia who presents with leg pain, arm pain, and chest pain.  She has a history of similar symptoms with sickle cell crises in the past.  She has occasional chest pain with her sickle cell crises.  She denies shortness of breath, fever, cough.  She states that she has taken ibuprofen at home with out pain relief.  She currently denies any narcotic use.  She rates her pain at 7 out of 10.  She denies any leg swelling or history of blood clots.  Of note, patient had similar presentation in early July.  She was evaluated for her chest pain with a negative troponin and a negative d-dimer at that time.  Past Medical History:  Diagnosis Date  . Sickle cell anemia (HCC)   . Sickle cell anemia [facility](HCC)     Patient Active Problem List   Diagnosis Date Noted  . Sickle-cell disease with pain (HCC) 08/01/2017  . Sickle cell disease, type [place], with unspecified crisis (HCC) 07/31/2017  . Sickle cell crisis (HCC) 05/28/2016  . 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 Sickle cell pain crisis (HCC) 01/29/2014  . Sickle cell pain crisis 12/12/2011    Past Surgical History:  Procedure Laterality Date  . NO PAST SURGERIES       OB History   None      Home Medications    Prior to Admission medications   Medication Sig Start Date End Date Taking? Authorizing Provider  acetaminophen (TYLENOL) 325 MG tablet Take 1.5 tablets (487.5 mg total) by mouth every 6 (six) hours. 01/27/17   Gardenia PhlegmMoore, Melissa Kepke, MD  ibuprofen (ADVIL,MOTRIN) 800 MG tablet Take 0.5 tablets (400 mg total) by mouth every 6 (six) hours. Patient taking differently: Take 800 mg by mouth every 6 (six) hours  as needed for mild pain.  01/27/17   Gardenia PhlegmMoore, Melissa Kepke, MD  oxyCODONE (OXY IR/ROXICODONE) 5 MG immediate release tablet Take 1 tablet (5 mg total) by mouth every 6 (six) hours as needed for moderate pain or severe pain. 06/11/18   Rise MuLeaphart, Kenneth T, PA-C    Family History Family History  Problem Relation Age of Onset  . Sickle cell anemia Brother     Social History Social History   Tobacco Use  . Smoking status: Never Smoker  . Smokeless tobacco: Never Used  Substance Use Topics  . Alcohol use: No  . Drug use: No     Allergies   Patient has no known allergies.   Review of Systems Review of Systems  Constitutional: Negative for fever.  Respiratory: Negative for cough and shortness of breath.   Cardiovascular: Positive for chest pain.  Gastrointestinal: Negative for abdominal pain.  Genitourinary: Negative for dysuria.  Musculoskeletal:       Leg pain, arm pain  Skin: Negative for rash.  All other systems reviewed and are negative.    Physical Exam Updated Vital Signs BP 113/66 (BP Location: Right Arm)   Pulse 91   Temp 97.9 F (36.6 C) (Oral)   Resp 16   Ht 5\' 7"  (1.702 m)   Wt 86.2 kg (190 lb)   LMP 06/04/2018 (  Approximate) Comment: "About 1 week ago" per pt  SpO2 100%   BMI 29.76 kg/m   Physical Exam  Constitutional: She is oriented to person, place, and time. She appears well-developed and well-nourished. No distress.  Sleeping initially on upon my arrival  HENT:  Head: Normocephalic and atraumatic.  Eyes: Pupils are equal, round, and reactive to light.  Neck: Neck supple.  Cardiovascular: Normal rate, regular rhythm and normal heart sounds.  Pulmonary/Chest: Effort normal and breath sounds normal. No respiratory distress. She has no wheezes.  Abdominal: Soft. Bowel sounds are normal. There is no tenderness.  Musculoskeletal: Normal range of motion. She exhibits no edema, tenderness or deformity.  Neurological: She is alert and oriented to person,  place, and time.  Skin: Skin is warm and dry.  Psychiatric: She has a normal mood and affect.  Nursing note and vitals reviewed.    ED Treatments / Results  Labs (all labs ordered are listed, but only abnormal results are displayed) Labs Reviewed  CBC WITH DIFFERENTIAL/PLATELET - Abnormal; Notable for the following components:      Result Value   Hemoglobin 10.2 (*)    HCT 29.6 (*)    MCV 69.6 (*)    MCH 24.0 (*)    RDW 18.5 (*)    All other components within normal limits  COMPREHENSIVE METABOLIC PANEL  RETICULOCYTES  I-STAT BETA HCG BLOOD, ED ([facility], [facility], [facility] ONLY)  I-STAT TROPONIN, ED    EKG EKG Interpretation  Date/Time:  Tuesday June 25 2018 20:17:29 EDT Ventricular Rate:  84 PR Interval:  158 QRS Duration: 78 QT Interval:  350 QTC Calculation: 413 R Axis:   55 Text Interpretation:  Normal sinus rhythm Normal ECG Confirmed by Ross Marcus ([id number]) on 06/26/2018 2:27:04 AM   Radiology Dg Chest 2 View  Result Date: 06/25/2018 CLINICAL DATA:  Chest pain for 2 days EXAM: CHEST - 2 VIEW COMPARISON:  06/11/2018 FINDINGS: The heart size and mediastinal contours are within normal limits. Both lungs are clear. The visualized skeletal structures are unremarkable. IMPRESSION: No active cardiopulmonary disease. Electronically Signed   By: Alcide Clever M.D.   On: 06/25/2018 21:24    Procedures Procedures (including critical care time)  Medications Ordered in ED Medications  sodium chloride 0.9 % bolus 1,000 mL (0 mLs Intravenous Stopped 06/26/18 0400)  ketorolac (TORADOL) 30 MG/ML injection 30 mg (30 mg Intravenous Given 06/26/18 0258)  morphine 4 MG/ML injection 4 mg (4 mg Intravenous Given 06/26/18 0258)  morphine 4 MG/ML injection 4 mg (4 mg Intravenous Given 06/26/18 0333)     Initial Impression / Assessment and Plan / ED Course  I have reviewed the triage vital signs and the nursing notes.  Pertinent labs & imaging results that were available during my care of the  patient were reviewed by me and considered in my medical decision making (see chart for details).  Clinical Course as of Jun 26 448  Wed Jun 26, 2018  0449 Patient improved after 2 doses of pain medication.   [CH]    Clinical Course User Index [CH] Horton, Mayer Masker, MD    Patient presents with pain consistent with prior sickle cell crises.  She also has chest pain.  She denies any shortness of breath.  She is not hypoxic.  Chest x-ray shows no evidence of infiltrate to suggest acute chest syndrome.  Lab work reviewed and largely reassuring.  She is overall nontoxic-appearing.  Suspect sickle cell crisis.  Patient was given 2 doses of  morphine, and Toradol.  On multiple rechecks she is resting comfortably and has to be awakened.  On recheck, she states that her pain is now 3 out of 10.  Will discharge home with PCP follow-up.  After history, exam, and medical workup I feel the patient has been appropriately medically screened and is safe for discharge home. Pertinent diagnoses were discussed with the patient. Patient was given return precautions.   Final Clinical Impressions(s) / ED Diagnoses   Final diagnoses:  Sickle cell pain crisis [facility])    ED Discharge Orders    None       Wilkie Aye, Mayer Masker, MD 06/26/18 [phone]

## 2018-09-23 ENCOUNTER — Emergency Department (HOSPITAL_COMMUNITY): Payer: Medicaid Other

## 2018-09-23 ENCOUNTER — Other Ambulatory Visit: Payer: Self-pay

## 2018-09-23 ENCOUNTER — Emergency Department (HOSPITAL_COMMUNITY)
Admission: EM | Admit: 2018-09-23 | Discharge: 2018-09-23 | Disposition: A | Discharge status: Home / Self Care | Payer: Medicaid Other | Attending: Emergency Medicine | Admitting: Emergency Medicine

## 2018-09-23 ENCOUNTER — Encounter (HOSPITAL_COMMUNITY): Payer: Self-pay | Admitting: Emergency Medicine

## 2018-09-23 DIAGNOSIS — D571 Sickle-cell disease without crisis: Secondary | ICD-10-CM | POA: Insufficient documentation

## 2018-09-23 DIAGNOSIS — S199XXA Unspecified injury of neck, initial encounter: Secondary | ICD-10-CM | POA: Diagnosis not present

## 2018-09-23 DIAGNOSIS — R519 Headache, unspecified: Secondary | ICD-10-CM

## 2018-09-23 DIAGNOSIS — G4489 Other headache syndrome: Secondary | ICD-10-CM | POA: Diagnosis not present

## 2018-09-23 DIAGNOSIS — M542 Cervicalgia: Secondary | ICD-10-CM | POA: Diagnosis not present

## 2018-09-23 DIAGNOSIS — R52 Pain, unspecified: Secondary | ICD-10-CM | POA: Diagnosis not present

## 2018-09-23 DIAGNOSIS — R51 Headache: Secondary | ICD-10-CM | POA: Diagnosis not present

## 2018-09-23 DIAGNOSIS — S0990XA Unspecified injury of head, initial encounter: Secondary | ICD-10-CM | POA: Diagnosis not present

## 2018-09-23 LAB — I-STAT BETA HCG BLOOD, ED (MC, WL, AP ONLY): I-stat hCG, quantitative: <5 m[IU]/mL (ref <5)

## 2018-09-23 MED ORDER — METHOCARBAMOL 500 MG PO TABS: 500.0000 mg | ORAL_TABLET | Freq: Two times a day (BID) | ORAL | 0 refills | Status: DC

## 2018-09-23 MED ORDER — ACETAMINOPHEN 500 MG PO TABS
1000.0000 mg | ORAL_TABLET | Freq: Once | ORAL | Status: AC
  Administered 2018-09-23: 1000 mg via ORAL
  Filled 2018-09-23: qty 2
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
## 2018-09-23 NOTE — ED Notes (Addendum)
Pt removed her C-Collar in triage herself .

## 2018-11-11 ENCOUNTER — Emergency Department (HOSPITAL_COMMUNITY)
Admission: EM | Admit: 2018-11-11 | Discharge: 2018-11-12 | Disposition: A | Discharge status: Home / Self Care | Payer: Medicaid Other | Attending: Emergency Medicine | Admitting: Emergency Medicine

## 2018-11-11 ENCOUNTER — Encounter (HOSPITAL_COMMUNITY): Payer: Self-pay | Admitting: *Deleted

## 2018-11-11 ENCOUNTER — Emergency Department (HOSPITAL_COMMUNITY): Payer: Medicaid Other

## 2018-11-11 DIAGNOSIS — J069 Acute upper respiratory infection, unspecified: Secondary | ICD-10-CM | POA: Diagnosis not present

## 2018-11-11 DIAGNOSIS — B9789 Other viral agents as the cause of diseases classified elsewhere: Secondary | ICD-10-CM | POA: Diagnosis not present

## 2018-11-11 DIAGNOSIS — D571 Sickle-cell disease without crisis: Secondary | ICD-10-CM | POA: Diagnosis not present

## 2018-11-11 DIAGNOSIS — R1030 Lower abdominal pain, unspecified: Secondary | ICD-10-CM | POA: Diagnosis not present

## 2018-11-11 LAB — CBC
HEMATOCRIT: 29.9 % — AB (ref 36.0–46.0)
HEMOGLOBIN: 10.3 g/dL — AB (ref 12.0–15.0)
MCH: 23.8 pg — ABNORMAL LOW (ref 26.0–34.0)
MCHC: 34.4 g/dL (ref 30.0–36.0)
MCV: 69.2 fL — ABNORMAL LOW (ref 80.0–100.0)
NRBC: 0 % (ref 0.0–0.2)
Platelets: 141 10*3/uL — ABNORMAL LOW (ref 150–400)
RBC: 4.32 MIL/uL (ref 3.87–5.11)
RDW: 18.5 % — ABNORMAL HIGH (ref 11.5–15.5)
WBC: 8.7 10*3/uL (ref 4.0–10.5)

## 2018-11-11 LAB — COMPREHENSIVE METABOLIC PANEL
ALT: 17 U/L (ref 0–44)
AST: 17 U/L (ref 15–41)
Albumin: 4.4 g/dL (ref 3.5–5.0)
Alkaline Phosphatase: 54 U/L (ref 38–126)
Anion gap: 4 — ABNORMAL LOW (ref 5–15)
BUN: 10 mg/dL (ref 6–20)
CHLORIDE: 110 mmol/L (ref 98–111)
CO2: 27 mmol/L (ref 22–32)
Calcium: 9.1 mg/dL (ref 8.9–10.3)
Creatinine, Ser: 0.49 mg/dL (ref 0.44–1.00)
Glucose, Bld: 99 mg/dL (ref 70–99)
POTASSIUM: 3.3 mmol/L — AB (ref 3.5–5.1)
Sodium: 141 mmol/L (ref 135–145)
Total Bilirubin: 1.2 mg/dL (ref 0.3–1.2)
Total Protein: 7.4 g/dL (ref 6.5–8.1)

## 2018-11-11 LAB — URINALYSIS, ROUTINE W REFLEX MICROSCOPIC
Bilirubin Urine: NEGATIVE
Glucose, UA: NEGATIVE mg/dL
Hgb urine dipstick: NEGATIVE
Ketones, ur: NEGATIVE mg/dL
LEUKOCYTES UA: NEGATIVE
NITRITE: NEGATIVE
PH: 5 (ref 5.0–8.0)
Protein, ur: NEGATIVE mg/dL
SPECIFIC GRAVITY, URINE: 1.013 (ref 1.005–1.030)

## 2018-11-11 LAB — LIPASE, BLOOD: LIPASE: 21 U/L (ref 11–51)

## 2018-11-11 LAB — I-STAT BETA HCG BLOOD, ED (MC, WL, AP ONLY): I-stat hCG, quantitative: <5 m[IU]/mL (ref <5)

## 2018-11-11 MED ORDER — MORPHINE SULFATE (PF) 4 MG/ML IV SOLN
4.0000 mg | Freq: Once | INTRAVENOUS | Status: AC
  Administered 2018-11-12: 4 mg via INTRAMUSCULAR
  Filled 2018-11-11: qty 1

## 2018-11-11 NOTE — ED Provider Notes (Signed)
[facility]-EMERGENCY DEPT Provider Note   CSN: [id number] Arrival date & time: 11/11/18  1608     History   Chief Complaint Chief Complaint  Patient presents with  . Abdominal Pain    HPI Nicole Case is a 17 y.o. female.  Patient to ED with complaint of lower abdominal pain for the past 3 weeks. She reports pain that comes and goes about every 15 minutes. No nausea, vomiting, fever, diarrhea, constipation, urinary symptoms, vaginal discharge or abnormal bleeding. No modifying factors. She has not taken anything at home to treat symptoms. She has a history of sickle cell anemia and reports her typical pain involves her arm and leg, not the abdomen. She has ibuprofen and Norco at home for sickle cell pain and has not taken these to treat her abdominal pain. She has not seen her primary care provider for evaluation prior to presenting to the ED tonight.   The history is provided by the patient. No language interpreter was used.    Past Medical History:  Diagnosis Date  . Sickle cell anemia (HCC)   . Sickle cell anemia [facility](HCC)     Patient Active Problem List   Diagnosis Date Noted  . Sickle-cell disease with pain (HCC) 08/01/2017  . Sickle cell disease, type [place], with unspecified crisis (HCC) 07/31/2017  . Sickle cell crisis (HCC) 05/28/2016  . 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 Sickle cell pain crisis (HCC) 01/29/2014  . Sickle cell pain crisis 12/12/2011    Past Surgical History:  Procedure Laterality Date  . NO PAST SURGERIES       OB History   None      Home Medications    Prior to Admission medications   Not on File    Family History Family History  Problem Relation Age of Onset  . Sickle cell anemia Brother     Social History Social History   Tobacco Use  . Smoking status: Never Smoker  . Smokeless tobacco: Never Used  Substance Use Topics  . Alcohol use: No  . Drug use: No     Allergies   Patient has no known  allergies.   Review of Systems Review of Systems  Constitutional: Negative for chills and fever.  HENT: Negative.   Respiratory: Negative.  Negative for shortness of breath.   Cardiovascular: Negative.  Negative for chest pain.  Gastrointestinal: Positive for abdominal pain. Negative for constipation, diarrhea, nausea and vomiting.  Genitourinary: Negative for dysuria, vaginal bleeding and vaginal discharge.  Musculoskeletal: Negative.        No extremity pain that she considers her typical sickle cell pain  Skin: Negative.   Neurological: Negative.      Physical Exam Updated Vital Signs BP 134/82 (BP Location: Left Arm)   Pulse (!) 106   Temp 99.5 F (37.5 C) (Oral)   Resp 16   SpO2 100%   Physical Exam  Constitutional: She appears well-developed and well-nourished. No distress.  HENT:  Head: Normocephalic.  Neck: Normal range of motion. Neck supple.  Cardiovascular: Normal rate and regular rhythm.  No murmur heard. Pulmonary/Chest: Effort normal and breath sounds normal. No stridor. She has no wheezes. She has no rhonchi. She has no rales.  Abdominal: Soft. Bowel sounds are decreased. There is no tenderness. There is no rebound and no guarding.  Musculoskeletal: Normal range of motion.  Neurological: She is alert. No cranial nerve deficit.  Skin: Skin is warm and dry. No rash noted.  Psychiatric: She  has a normal mood and affect.  Nursing note and vitals reviewed.    ED Treatments / Results  Labs (all labs ordered are listed, but only abnormal results are displayed) Labs Reviewed  COMPREHENSIVE METABOLIC PANEL - Abnormal; Notable for the following components:      Result Value   Potassium 3.3 (*)    Anion gap 4 (*)    All other components within normal limits  CBC - Abnormal; Notable for the following components:   Hemoglobin 10.3 (*)    HCT 29.9 (*)    MCV 69.2 (*)    MCH 23.8 (*)    RDW 18.5 (*)    Platelets 141 (*)    All other components within normal  limits  LIPASE, BLOOD  URINALYSIS, ROUTINE W REFLEX MICROSCOPIC  I-STAT BETA HCG BLOOD, ED ([facility], [facility], [facility] ONLY)   Results for orders placed or performed during the hospital encounter of 11/11/18  Lipase, blood  Result Value Ref Range   Lipase 21 11 - 51 U/L  Comprehensive metabolic panel  Result Value Ref Range   Sodium 141 135 - 145 mmol/L   Potassium 3.3 (L) 3.5 - 5.1 mmol/L   Chloride 110 98 - 111 mmol/L   CO2 27 22 - 32 mmol/L   Glucose, Bld 99 70 - 99 mg/dL   BUN 10 6 - 20 mg/dL   Creatinine, Ser 1.61 0.44 - 1.00 mg/dL   Calcium 9.1 8.9 - 09.6 mg/dL   Total Protein 7.4 6.5 - 8.1 g/dL   Albumin 4.4 3.5 - 5.0 g/dL   AST 17 15 - 41 U/L   ALT 17 0 - 44 U/L   Alkaline Phosphatase 54 38 - 126 U/L   Total Bilirubin 1.2 0.3 - 1.2 mg/dL   GFR calc non Af Amer >60 >60 mL/min   GFR calc Af Amer >60 >60 mL/min   Anion gap 4 (L) 5 - 15  CBC  Result Value Ref Range   WBC 8.7 4.0 - 10.5 K/uL   RBC 4.32 3.87 - 5.11 MIL/uL   Hemoglobin 10.3 (L) 12.0 - 15.0 g/dL   HCT 04.5 (L) 40.9 - 81.1 %   MCV 69.2 (L) 80.0 - 100.0 fL   MCH 23.8 (L) 26.0 - 34.0 pg   MCHC 34.4 30.0 - 36.0 g/dL   RDW 91.4 (H) 78.2 - 95.6 %   Platelets 141 (L) 150 - 400 K/uL   nRBC 0.0 0.0 - 0.2 %  Urinalysis, Routine w reflex microscopic  Result Value Ref Range   Color, Urine YELLOW YELLOW   APPearance CLEAR CLEAR   Specific Gravity, Urine 1.013 1.005 - 1.030   pH 5.0 5.0 - 8.0   Glucose, UA NEGATIVE NEGATIVE mg/dL   Hgb urine dipstick NEGATIVE NEGATIVE   Bilirubin Urine NEGATIVE NEGATIVE   Ketones, ur NEGATIVE NEGATIVE mg/dL   Protein, ur NEGATIVE NEGATIVE mg/dL   Nitrite NEGATIVE NEGATIVE   Leukocytes, UA NEGATIVE NEGATIVE  I-Stat beta hCG blood, ED  Result Value Ref Range   I-stat hCG, quantitative <5.0 <5 mIU/mL   Comment 3            EKG None  Radiology No results found.  Procedures Procedures (including critical care time)  Medications Ordered in ED Medications - No data to  display   Initial Impression / Assessment and Plan / ED Course  I have reviewed the triage vital signs and the nursing notes.  Pertinent labs & imaging results that were available during  my care of the patient were reviewed by me and considered in my medical decision making (see chart for details).     Patient to ED with 3 weeks of abdominal pain in lower abdomen. No other symptoms. No aggravating/alleviating factors. No treatment at home.   Patient is very well appearing. She has a low grade fever. Pelvic exam was offered but the patient does not feel her symptoms are pelvic and declined exam.   Labs, imaging reviewed with Dr. Erma Heritage. No acute process suspected given pain for 3 weeks without fever or change today.   Recheck finds the patient resting. VS improved on monitor. Discussed close follow up with primary care for recheck this week. She now reports 3 days of URI symptoms including nasal congestion, cough, body aches. She expresses concern for flu. Discussed supportive care and PCP follow up for these symptoms as well.   Hgb stable at 10.3. Symptoms atypical for sickle cell related pain - doubt crisis. Patient feels the same and does not feel this is sickle cell related based on her history. Offered pain dose here prior to discharge. Strongly encouraged being seen by PCP this week. REturn precautions discussed.  Final Clinical Impressions(s) / ED Diagnoses   Final diagnoses:  None   1. Abdominal pain 2. URI  ED Discharge Orders    None       Elpidio Anis, PA-C 11/11/18 2357    Shaune Pollack, MD 11/12/18 1501

## 2018-11-11 NOTE — ED Notes (Signed)
Patient has 2 extra gold tops in the main lab 

## 2018-11-11 NOTE — ED Triage Notes (Addendum)
Pt complains of lower abdominal pain x 3 weeks. Pt's LMP was 3-4 weeks ago. Pt denies n/v/d, urinary symptoms or vaginal discharge. Pt has sickle cell anemia, states, she does not feel like she is in pain crisis.

## 2018-11-11 NOTE — Discharge Instructions (Addendum)
Be sure to follow up with your doctor this week for recheck of symptoms. Return here with any worsening pain, high fever, vomiting or new symptom of concern.

## 2018-11-20 ENCOUNTER — Encounter (HOSPITAL_COMMUNITY): Payer: Self-pay

## 2018-11-20 ENCOUNTER — Emergency Department (HOSPITAL_COMMUNITY)
Admission: EM | Admit: 2018-11-20 | Discharge: 2018-11-20 | Disposition: A | Discharge status: Home / Self Care | Payer: Medicaid Other | Attending: Emergency Medicine | Admitting: Emergency Medicine

## 2018-11-20 DIAGNOSIS — D5700 Hb-SS disease with crisis, unspecified: Secondary | ICD-10-CM | POA: Insufficient documentation

## 2018-11-20 DIAGNOSIS — M791 Myalgia, unspecified site: Secondary | ICD-10-CM | POA: Diagnosis not present

## 2018-11-20 DIAGNOSIS — M79601 Pain in right arm: Secondary | ICD-10-CM | POA: Diagnosis present

## 2018-11-20 LAB — CBC WITH DIFFERENTIAL/PLATELET
BAND NEUTROPHILS: 0 %
BASOS ABS: 0 10*3/uL (ref 0.0–0.1)
BLASTS: 0 %
Basophils Relative: 0 %
Eosinophils Absolute: 0.1 10*3/uL (ref 0.0–0.5)
Eosinophils Relative: 2 %
HCT: 31.2 % — ABNORMAL LOW (ref 36.0–46.0)
Hemoglobin: 10.9 g/dL — ABNORMAL LOW (ref 12.0–15.0)
Lymphocytes Relative: 32 %
Lymphs Abs: 2.1 10*3/uL (ref 0.7–4.0)
MCH: 24 pg — ABNORMAL LOW (ref 26.0–34.0)
MCHC: 34.9 g/dL (ref 30.0–36.0)
MCV: 68.6 fL — AB (ref 80.0–100.0)
METAMYELOCYTES PCT: 0 %
Monocytes Absolute: 0.6 10*3/uL (ref 0.1–1.0)
Monocytes Relative: 9 %
Myelocytes: 0 %
Neutro Abs: 3.9 10*3/uL (ref 1.7–7.7)
Neutrophils Relative %: 57 %
Other: 0 %
Platelets: 202 10*3/uL (ref 150–400)
Promyelocytes Relative: 0 %
RBC: 4.55 MIL/uL (ref 3.87–5.11)
RDW: 18.6 % — ABNORMAL HIGH (ref 11.5–15.5)
WBC: 6.7 10*3/uL (ref 4.0–10.5)
nRBC: 0 % (ref 0.0–0.2)
nRBC: 0 /100 WBC

## 2018-11-20 LAB — RETICULOCYTES
Immature Retic Fract: 19.9 % — ABNORMAL HIGH (ref 2.3–15.9)
RBC.: 4.55 MIL/uL (ref 3.87–5.11)
Retic Count, Absolute: 134.7 10*3/uL (ref 19.0–186.0)
Retic Ct Pct: 3 % (ref 0.4–3.1)

## 2018-11-20 LAB — BASIC METABOLIC PANEL
Anion gap: 9 (ref 5–15)
BUN: 9 mg/dL (ref 6–20)
CO2: 25 mmol/L (ref 22–32)
Calcium: 9 mg/dL (ref 8.9–10.3)
Chloride: 109 mmol/L (ref 98–111)
Creatinine, Ser: 0.61 mg/dL (ref 0.44–1.00)
GFR calc non Af Amer: >60 mL/min (ref >60)
Glucose, Bld: 98 mg/dL (ref 70–99)
Potassium: 3.9 mmol/L (ref 3.5–5.1)
Sodium: 143 mmol/L (ref 135–145)

## 2018-11-20 LAB — POC URINE PREG, ED: Preg Test, Ur: NEGATIVE

## 2018-11-20 MED ORDER — HYDROMORPHONE HCL 1 MG/ML IJ SOLN: 1.0000 mg | INTRAMUSCULAR | Status: DC

## 2018-11-20 MED ORDER — HYDROMORPHONE HCL 1 MG/ML IJ SOLN: 0.5000 mg | INTRAMUSCULAR | Status: DC

## 2018-11-20 MED ORDER — SODIUM CHLORIDE 0.45 % IV SOLN
INTRAVENOUS | Status: DC
  Administered 2018-11-20: 18:00:00 via INTRAVENOUS

## 2018-11-20 MED ORDER — OXYCODONE HCL 5 MG PO TABS
10.0000 mg | ORAL_TABLET | Freq: Once | ORAL | Status: AC
  Administered 2018-11-20: 10 mg via ORAL
  Filled 2018-11-20: qty 2

## 2018-11-20 MED ORDER — KETOROLAC TROMETHAMINE 30 MG/ML IJ SOLN
30.0000 mg | INTRAMUSCULAR | Status: AC
  Administered 2018-11-20: 30 mg via INTRAVENOUS
  Filled 2018-11-20: qty 1

## 2018-11-20 MED ORDER — ONDANSETRON HCL 4 MG/2ML IJ SOLN: 4.0000 mg | INTRAMUSCULAR | Status: DC | PRN

## 2018-11-20 NOTE — ED Notes (Signed)
Pt aware that urine sample is needed, but is unable to provide one at this time 

## 2018-11-20 NOTE — ED Triage Notes (Signed)
Pt c/o R arm pain that started 2 days ago; denies cp, dizziness; hx sickle cell

## 2018-11-20 NOTE — ED Provider Notes (Addendum)
[facility] Provider Note   CSN: [id number] Arrival date & time: 11/20/18  1654     History   Chief Complaint Chief Complaint  Patient presents with  . Sickle Cell Pain Crisis    HPI Nicole Case is a 17 y.o. female with past medical history of sickle cell anemia, presenting to the emergency department with 3 days of right arm pain, typical for her sickle cell pain.  Pain is worse with palpation and movement.  States she has been treating her pain with ibuprofen and prescribed oxycodone, 10 mg every 4-6 hours.  She states today she has not taken any of her oxycodone, states she only has 2 tabs left.  Her pain management provider was Dr. Thea SilversmithMackenzie, who is no longer practicing.  She states her pain is only a 6/10 severity.  Denies chest pain, shortness breath, fever, abdominal complaints, or any other plaints today.  The history is provided by the patient.    Past Medical History:  Diagnosis Date  . Sickle cell anemia (HCC)   . Sickle cell anemia [facility](HCC)     Patient Active Problem List   Diagnosis Date Noted  . Sickle-cell disease with pain (HCC) 08/01/2017  . Sickle cell disease, type [place], with unspecified crisis (HCC) 07/31/2017  . Sickle cell crisis (HCC) 05/28/2016  . 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 Sickle cell pain crisis (HCC) 01/29/2014  . Sickle cell pain crisis 12/12/2011    Past Surgical History:  Procedure Laterality Date  . NO PAST SURGERIES       OB History   No obstetric history on file.      Home Medications    Prior to Admission medications   Medication Sig Start Date End Date Taking? Authorizing Provider  ibuprofen (ADVIL,MOTRIN) 200 MG tablet Take 200 mg by mouth every 4 (four) hours as needed for moderate pain.   Yes [provider]  Oxycodone HCl 10 MG TABS Take 10 mg by mouth every 6 (six) hours as needed.   Yes [provider]    Family History Family History  Problem  Relation Age of Onset  . Sickle cell anemia Brother     Social History Social History   Tobacco Use  . Smoking status: Never Smoker  . Smokeless tobacco: Never Used  Substance Use Topics  . Alcohol use: No  . Drug use: No     Allergies   Patient has no known allergies.   Review of Systems Review of Systems  Constitutional: Negative for fever.  Respiratory: Negative for shortness of breath.   Cardiovascular: Negative for chest pain.  Gastrointestinal: Negative for abdominal pain.  Musculoskeletal: Positive for arthralgias and myalgias.  All other systems reviewed and are negative.    Physical Exam Updated Vital Signs BP (!) 120/95   Pulse 75   Resp 14   Ht 5\' 7"  (1.702 m)   Wt 80.7 kg   SpO2 100%   BMI 27.88 kg/m   Physical Exam Vitals signs and nursing note reviewed.  Constitutional:      General: She is not in acute distress.    Appearance: She is well-developed.  HENT:     Head: Normocephalic and atraumatic.  Eyes:     Conjunctiva/sclera: Conjunctivae normal.  Neck:     Musculoskeletal: Normal range of motion.  Cardiovascular:     Rate and Rhythm: Normal rate and regular rhythm.     Pulses: Normal pulses.     Heart sounds:  Normal heart sounds.  Pulmonary:     Effort: Pulmonary effort is normal.     Breath sounds: Normal breath sounds.  Abdominal:     General: Bowel sounds are normal.     Palpations: Abdomen is soft.     Tenderness: There is no abdominal tenderness. There is no guarding.  Musculoskeletal:     Comments: Diffuse tenderness to the right arm.  No erythema or edema.  Skin:    General: Skin is warm.  Neurological:     Mental Status: She is alert.  Psychiatric:        Mood and Affect: Affect is flat.        Behavior: Behavior normal.      ED Treatments / Results  Labs (all labs ordered are listed, but only abnormal results are displayed) Labs Reviewed  CBC WITH DIFFERENTIAL/PLATELET - Abnormal; Notable for the following  components:      Result Value   Hemoglobin 10.9 (*)    HCT 31.2 (*)    MCV 68.6 (*)    MCH 24.0 (*)    RDW 18.6 (*)    All other components within normal limits  RETICULOCYTES - Abnormal; Notable for the following components:   Immature Retic Fract 19.9 (*)    All other components within normal limits  BASIC METABOLIC PANEL  POC URINE PREG, ED    EKG None  Radiology No results found.  Procedures Procedures (including critical care time)  Medications Ordered in ED Medications  0.45 % sodium chloride infusion ( Intravenous New Bag/Given 11/20/18 1730)  ketorolac (TORADOL) 30 MG/ML injection 30 mg (30 mg Intravenous Given 11/20/18 1726)  oxyCODONE (Oxy IR/ROXICODONE) immediate release tablet 10 mg (10 mg Oral Given 11/20/18 1727)     Initial Impression / Assessment and Plan / ED Course  I have reviewed the triage vital signs and the nursing notes.  Pertinent labs & imaging results that were available during my care of the patient were reviewed by me and considered in my medical decision making (see chart for details).  Clinical Course as of Nov 20 1909  Wed Nov 20, 2018  4098 Patient reevaluated, reports improvement in symptoms and is ready for discharge.  Requesting work note.   [JR]    Clinical Course User Index [JR] Riot Waterworth, [place] N, PA-C    Patient presenting with sickle cell crisis. Patient with typical symptoms and pain. Pt without CP, abdominal pain, or SOB. Pt is not exhibiting signs or symptoms of acute chest syndrome, organ failure, or DVT. Pt is afebrile, hemodynamically stable. WBC wnl. Hgb without change from baseline. Patient treated with toradol, IVF and home dose of ocycodone with improvement in symptoms. Pt tolerating PO. Pt is safe for discharge with symptomatic management. Strict return precautions discussed. Will provide referral to sickle cell clinic, as she does not currently have a sickle cell provider to manage her sickle cell or pain  medications.  Discussed results, findings, treatment and follow up. Patient advised of return precautions. Patient verbalized understanding and agreed with plan.   Final Clinical Impressions(s) / ED Diagnoses   Final diagnoses:  Sickle cell anemia with pain [facility])    ED Discharge Orders    None       Khing Belcher, [place] N, PA-C 11/20/18 1912    Kaori Jumper, [place] N, PA-C 11/20/18 1926    Virgina Norfolk, DO 11/21/18 0038

## 2018-11-20 NOTE — Discharge Instructions (Signed)
Please follow up with your sickle cell provider regarding your visit today. °Take your pain medications as prescribed, as needed. °Return to the ER for chest pain, shortness of breath, fever, or new or concerning symptoms. ° °

## 2018-12-24 ENCOUNTER — Emergency Department (HOSPITAL_COMMUNITY)
Admission: EM | Admit: 2018-12-24 | Discharge: 2018-12-25 | Discharge status: Against Medical Advice | Payer: Medicaid Other

## 2018-12-24 NOTE — ED Notes (Signed)
Pt. wanted to leave because of wait time. This tech encouraged Pt. to stay to see a provider and was instructed to return if symptoms worsened. Pt. was moved OTF.

## 2018-12-30 ENCOUNTER — Encounter (HOSPITAL_COMMUNITY): Payer: Self-pay | Admitting: *Deleted

## 2018-12-30 ENCOUNTER — Emergency Department (HOSPITAL_COMMUNITY)
Admission: EM | Admit: 2018-12-30 | Discharge: 2018-12-31 | Disposition: A | Discharge status: Home / Self Care | Payer: Medicaid Other | Attending: Emergency Medicine | Admitting: Emergency Medicine

## 2018-12-30 DIAGNOSIS — Z79899 Other long term (current) drug therapy: Secondary | ICD-10-CM | POA: Diagnosis not present

## 2018-12-30 DIAGNOSIS — D5700 Hb-SS disease with crisis, unspecified: Secondary | ICD-10-CM

## 2018-12-30 NOTE — ED Triage Notes (Signed)
Pt in c/o sickle cell pain crisis, c/o pain to her nose and her left arm, arm is typical for her crisis but not face, no distress noted

## 2018-12-31 LAB — COMPREHENSIVE METABOLIC PANEL
ALT: 25 U/L (ref 0–44)
AST: 21 U/L (ref 15–41)
Albumin: 3.9 g/dL (ref 3.5–5.0)
Alkaline Phosphatase: 56 U/L (ref 38–126)
Anion gap: 7 (ref 5–15)
BUN: 9 mg/dL (ref 6–20)
CALCIUM: 8.8 mg/dL — AB (ref 8.9–10.3)
CO2: 21 mmol/L — AB (ref 22–32)
Chloride: 109 mmol/L (ref 98–111)
Creatinine, Ser: 0.57 mg/dL (ref 0.44–1.00)
GFR calc Af Amer: >60 mL/min (ref >60)
GFR calc non Af Amer: >60 mL/min (ref >60)
Glucose, Bld: 109 mg/dL — ABNORMAL HIGH (ref 70–99)
Potassium: 3.3 mmol/L — ABNORMAL LOW (ref 3.5–5.1)
Sodium: 137 mmol/L (ref 135–145)
Total Bilirubin: 2.1 mg/dL — ABNORMAL HIGH (ref 0.3–1.2)
Total Protein: 7.4 g/dL (ref 6.5–8.1)

## 2018-12-31 LAB — CBC WITH DIFFERENTIAL/PLATELET
ABS IMMATURE GRANULOCYTES: 0.04 10*3/uL (ref 0.00–0.07)
Basophils Absolute: 0 10*3/uL (ref 0.0–0.1)
Basophils Relative: 1 %
Eosinophils Absolute: 0.2 10*3/uL (ref 0.0–0.5)
Eosinophils Relative: 2 %
HCT: 29.5 % — ABNORMAL LOW (ref 36.0–46.0)
HEMOGLOBIN: 10 g/dL — AB (ref 12.0–15.0)
IMMATURE GRANULOCYTES: 1 %
Lymphocytes Relative: 31 %
Lymphs Abs: 2.3 10*3/uL (ref 0.7–4.0)
MCH: 23.2 pg — ABNORMAL LOW (ref 26.0–34.0)
MCHC: 33.9 g/dL (ref 30.0–36.0)
MCV: 68.4 fL — ABNORMAL LOW (ref 80.0–100.0)
Monocytes Absolute: 0.8 10*3/uL (ref 0.1–1.0)
Monocytes Relative: 11 %
NEUTROS ABS: 3.9 10*3/uL (ref 1.7–7.7)
NEUTROS PCT: 54 %
Platelets: 156 10*3/uL (ref 150–400)
RBC: 4.31 MIL/uL (ref 3.87–5.11)
RDW: 19.2 % — ABNORMAL HIGH (ref 11.5–15.5)
WBC: 7.2 10*3/uL (ref 4.0–10.5)
nRBC: 0 % (ref 0.0–0.2)

## 2018-12-31 LAB — I-STAT BETA HCG BLOOD, ED (MC, WL, AP ONLY): I-stat hCG, quantitative: <5 m[IU]/mL (ref <5)

## 2018-12-31 LAB — RETICULOCYTES
Immature Retic Fract: 30.3 % — ABNORMAL HIGH (ref 2.3–15.9)
RBC.: 4.31 MIL/uL (ref 3.87–5.11)
Retic Count, Absolute: 156.5 10*3/uL (ref 19.0–186.0)
Retic Ct Pct: 3.6 % — ABNORMAL HIGH (ref 0.4–3.1)

## 2018-12-31 MED ORDER — KETOROLAC TROMETHAMINE 30 MG/ML IJ SOLN
30.0000 mg | INTRAMUSCULAR | Status: AC
  Administered 2018-12-31: 30 mg via INTRAVENOUS
  Filled 2018-12-31: qty 1

## 2018-12-31 MED ORDER — HYDROMORPHONE HCL 1 MG/ML IJ SOLN
1.0000 mg | Freq: Once | INTRAMUSCULAR | Status: AC
  Administered 2018-12-31: 1 mg via INTRAVENOUS
  Filled 2018-12-31: qty 1

## 2018-12-31 MED ORDER — DEXTROSE-NACL 5-0.45 % IV SOLN
INTRAVENOUS | Status: DC
  Administered 2018-12-31: 02:00:00 via INTRAVENOUS

## 2018-12-31 MED ORDER — OXYCODONE HCL 5 MG PO TABS
10.0000 mg | ORAL_TABLET | Freq: Once | ORAL | Status: AC
  Administered 2018-12-31: 10 mg via ORAL
  Filled 2018-12-31: qty 2

## 2018-12-31 MED ORDER — OXYCODONE HCL 5 MG PO TABS: 5.0000 mg | ORAL_TABLET | Freq: Four times a day (QID) | ORAL | 0 refills | Status: AC | PRN

## 2018-12-31 NOTE — ED Provider Notes (Signed)
[facility] EMERGENCY DEPARTMENT Provider Note   CSN: [id number] Arrival date & time: 12/30/18  1611     History   Chief Complaint Chief Complaint  Patient presents with  . Sickle Cell Pain Crisis    HPI Nicole Case is a 18 y.o. female.  Patient presents with typical sickle cell pain involving her arms, back, legs and nose.  States the pain is been ongoing for the past 3 days.  She has been out of her oxycodone at home which he takes on a as needed basis.  Last episode of pain was on January 21 when she came to the hospital but left without being seen.  She denies fevers, chills, nausea, vomiting.  No chest pain or shortness of breath.  The pain is typical of her usual sickle cell pain no pain with urination or blood in the urine.  The history is provided by the patient.  Sickle Cell Pain Crisis  Associated symptoms: no chest pain, no congestion, no cough, no fever, no headaches, no nausea, no shortness of breath and no vomiting     Past Medical History:  Diagnosis Date  . Sickle cell anemia (HCC)   . Sickle cell anemia [facility])     Patient Active Problem List   Diagnosis Date Noted  . Sickle-cell disease with pain (HCC) 08/01/2017  . Sickle cell disease, type [place], with unspecified crisis (HCC) 07/31/2017  . Sickle cell crisis (HCC) 05/28/2016  . 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 Sickle cell pain crisis (HCC) 01/29/2014  . Sickle cell pain crisis 12/12/2011    Past Surgical History:  Procedure Laterality Date  . NO PAST SURGERIES       OB History   No obstetric history on file.      Home Medications    Prior to Admission medications   Medication Sig Start Date End Date Taking? Authorizing Provider  ibuprofen (ADVIL,MOTRIN) 200 MG tablet Take 200 mg by mouth every 4 (four) hours as needed for moderate pain.    [provider]  Oxycodone HCl 10 MG TABS Take 10 mg by mouth every 6 (six) hours as needed.    [provider]      Family History Family History  Problem Relation Age of Onset  . Sickle cell anemia Brother     Social History Social History   Tobacco Use  . Smoking status: Never Smoker  . Smokeless tobacco: Never Used  Substance Use Topics  . Alcohol use: No  . Drug use: No     Allergies   Patient has no known allergies.   Review of Systems Review of Systems  Constitutional: Negative for activity change, appetite change and fever.  HENT: Negative for congestion.   Respiratory: Negative for cough, chest tightness and shortness of breath.   Cardiovascular: Negative for chest pain.  Gastrointestinal: Negative for abdominal pain, nausea and vomiting.  Genitourinary: Negative for dysuria, hematuria, vaginal bleeding and vaginal discharge.  Musculoskeletal: Positive for arthralgias and myalgias.  Skin: Negative for wound.  Neurological: Negative for dizziness, light-headedness and headaches.   all other systems are negative except as noted in the HPI and PMH.     Physical Exam Updated Vital Signs BP 126/79 (BP Location: Left Arm)   Pulse 81   Temp 98.4 F (36.9 C) (Oral)   Resp 18   SpO2 98%   Physical Exam Vitals signs and nursing note reviewed.  Constitutional:      General: She is not in  acute distress.    Appearance: She is well-developed.     Comments: Flat affect  HENT:     Head: Normocephalic and atraumatic.     Mouth/Throat:     Pharynx: No oropharyngeal exudate.  Eyes:     Conjunctiva/sclera: Conjunctivae normal.     Pupils: Pupils are equal, round, and reactive to light.  Neck:     Musculoskeletal: Normal range of motion and neck supple.     Comments: No meningismus. Cardiovascular:     Rate and Rhythm: Normal rate and regular rhythm.     Heart sounds: Normal heart sounds. No murmur.  Pulmonary:     Effort: Pulmonary effort is normal. No respiratory distress.     Breath sounds: Normal breath sounds.  Abdominal:     Palpations: Abdomen is soft.      Tenderness: There is no abdominal tenderness. There is no guarding or rebound.  Musculoskeletal: Normal range of motion.        General: No tenderness.     Comments: Full range of motion of all major joints without erythema or edema  Skin:    General: Skin is warm.  Neurological:     Mental Status: She is alert and oriented to person, place, and time.     Cranial Nerves: No cranial nerve deficit.     Motor: No abnormal muscle tone.     Coordination: Coordination normal.     Comments:  5/5 strength throughout. CN 2-12 intact.Equal grip strength.   Psychiatric:        Behavior: Behavior normal.      ED Treatments / Results  Labs (all labs ordered are listed, but only abnormal results are displayed) Labs Reviewed  CBC WITH DIFFERENTIAL/PLATELET - Abnormal; Notable for the following components:      Result Value   Hemoglobin 10.0 (*)    HCT 29.5 (*)    MCV 68.4 (*)    MCH 23.2 (*)    RDW 19.2 (*)    All other components within normal limits  COMPREHENSIVE METABOLIC PANEL - Abnormal; Notable for the following components:   Potassium 3.3 (*)    CO2 21 (*)    Glucose, Bld 109 (*)    Calcium 8.8 (*)    Total Bilirubin 2.1 (*)    All other components within normal limits  RETICULOCYTES - Abnormal; Notable for the following components:   Retic Ct Pct 3.6 (*)    Immature Retic Fract 30.3 (*)    All other components within normal limits  I-STAT BETA HCG BLOOD, ED ([facility], [facility], [facility] ONLY)    EKG None  Radiology No results found.  Procedures Procedures (including critical care time)  Medications Ordered in ED Medications  dextrose 5 %-0.45 % sodium chloride infusion (has no administration in time range)  ketorolac (TORADOL) 30 MG/ML injection 30 mg (has no administration in time range)  HYDROmorphone (DILAUDID) injection 1 mg (has no administration in time range)     Initial Impression / Assessment and Plan / ED Course  I have reviewed the triage vital signs and the nursing  notes.  Pertinent labs & imaging results that were available during my care of the patient were reviewed by me and considered in my medical decision making (see chart for details).     Typical sickle cell pain.  No fever, chest pain or shortness of breath.  Patient given IV fluids, pain control with Dilaudid and Toradol.  Labs show stable anemia.  Reticulocyte count adequate.  Patient's pain improved with treatment in the ED. She received 2 doses of IV pain medication 1 dose of p.o. oxycodone.  Patient states her PCP Dr. Ronne BinningMcKenzie is no longer in practice.  Narcotic database reviewed showed that she is not received any narcotic prescription since February 2019. Will provide short course of pain medications until she can establish care with a primary physician.  Suspect typical sickle cell exacerbation. New PCP referrals given.  Return precautions discussed.  Final Clinical Impressions(s) / ED Diagnoses   Final diagnoses:  Sickle cell pain crisis [facility](HCC)    ED Discharge Orders    None       Glynn Octaveancour, Arcenio Mullaly, MD 12/31/18 [phone]

## 2018-12-31 NOTE — Discharge Instructions (Addendum)
Your blood work is reassuring.  Follow-up with your doctor for recheck.  Return to the ED with fever, chest pain, worsening pain, any other concerns.

## 2019-04-25 ENCOUNTER — Emergency Department
Admission: EM | Admit: 2019-04-25 | Discharge: 2019-04-25 | Disposition: A | Discharge status: Home / Self Care | Payer: Medicaid Other | Attending: Student in an Organized Health Care Education/Training Program | Admitting: Student in an Organized Health Care Education/Training Program

## 2019-04-25 ENCOUNTER — Encounter: Payer: Self-pay | Admitting: Emergency Medicine

## 2019-04-25 ENCOUNTER — Emergency Department: Payer: Medicaid Other

## 2019-04-25 ENCOUNTER — Other Ambulatory Visit: Payer: Self-pay

## 2019-04-25 DIAGNOSIS — M542 Cervicalgia: Secondary | ICD-10-CM | POA: Diagnosis not present

## 2019-04-25 DIAGNOSIS — M5489 Other dorsalgia: Secondary | ICD-10-CM | POA: Insufficient documentation

## 2019-04-25 DIAGNOSIS — M791 Myalgia, unspecified site: Secondary | ICD-10-CM | POA: Diagnosis not present

## 2019-04-25 DIAGNOSIS — S199XXA Unspecified injury of neck, initial encounter: Secondary | ICD-10-CM | POA: Diagnosis not present

## 2019-04-25 DIAGNOSIS — Z79899 Other long term (current) drug therapy: Secondary | ICD-10-CM | POA: Insufficient documentation

## 2019-04-25 DIAGNOSIS — M546 Pain in thoracic spine: Secondary | ICD-10-CM | POA: Diagnosis not present

## 2019-04-25 DIAGNOSIS — S299XXA Unspecified injury of thorax, initial encounter: Secondary | ICD-10-CM | POA: Diagnosis not present

## 2019-04-25 DIAGNOSIS — M7918 Myalgia, other site: Secondary | ICD-10-CM

## 2019-04-25 MED ORDER — NAPROXEN 500 MG PO TABS: 500.0000 mg | ORAL_TABLET | Freq: Two times a day (BID) | ORAL | 0 refills | Status: DC

## 2019-04-25 MED ORDER — TIZANIDINE HCL 6 MG PO CAPS: 6.0000 mg | ORAL_CAPSULE | Freq: Three times a day (TID) | ORAL | 0 refills | Status: DC | PRN
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
## 2019-04-25 NOTE — Discharge Instructions (Signed)
Follow up with your primary provider for symptoms that are not improving over the week.  Return to the ER for symptoms that change or worsen if unable to schedule an appointment.
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
## 2019-05-11 IMAGING — CR DG CHEST 2V
2 series · 2 of 2 positions shown · non-contrast
Comparison: 06/11/2018

CLINICAL DATA: Chest pain for 2 days

EXAM:
CHEST - 2 VIEW

[chest pa]
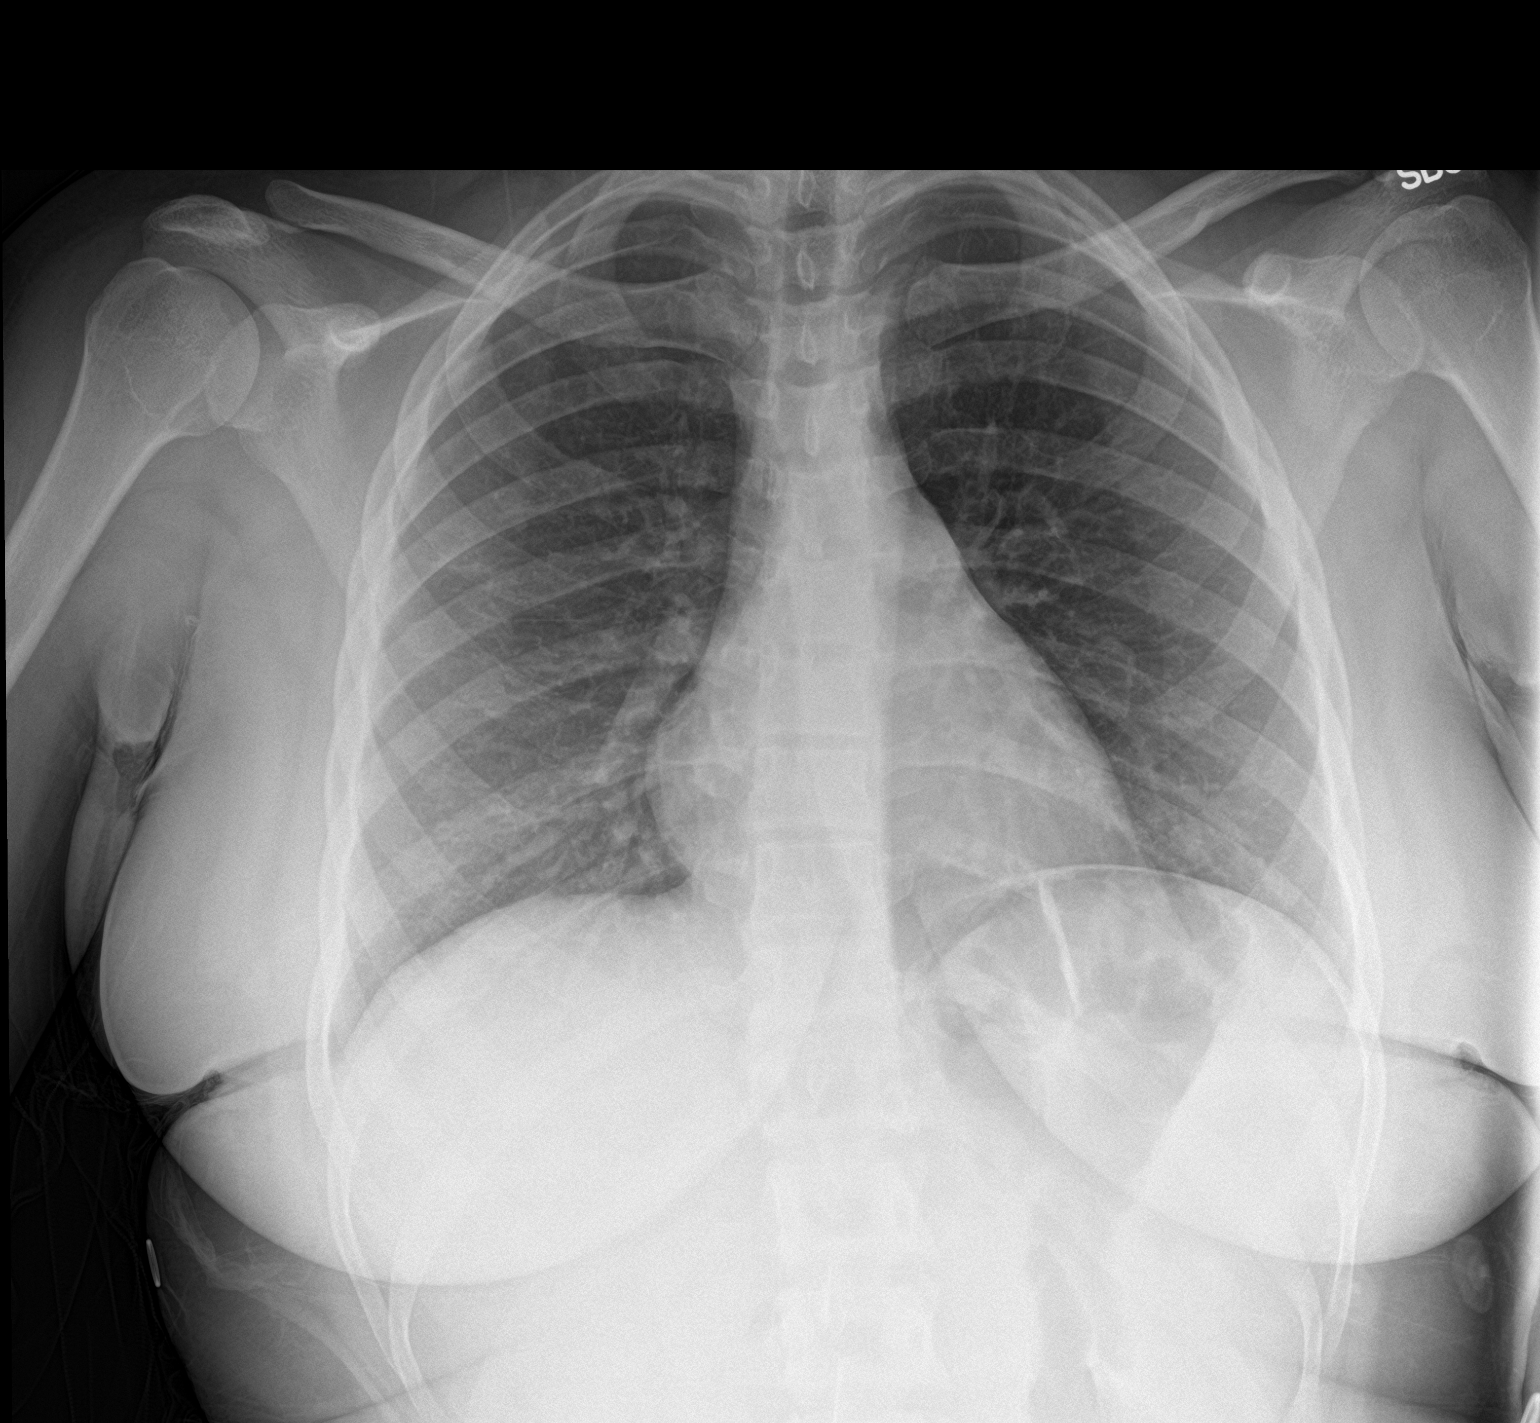

[chest lat]
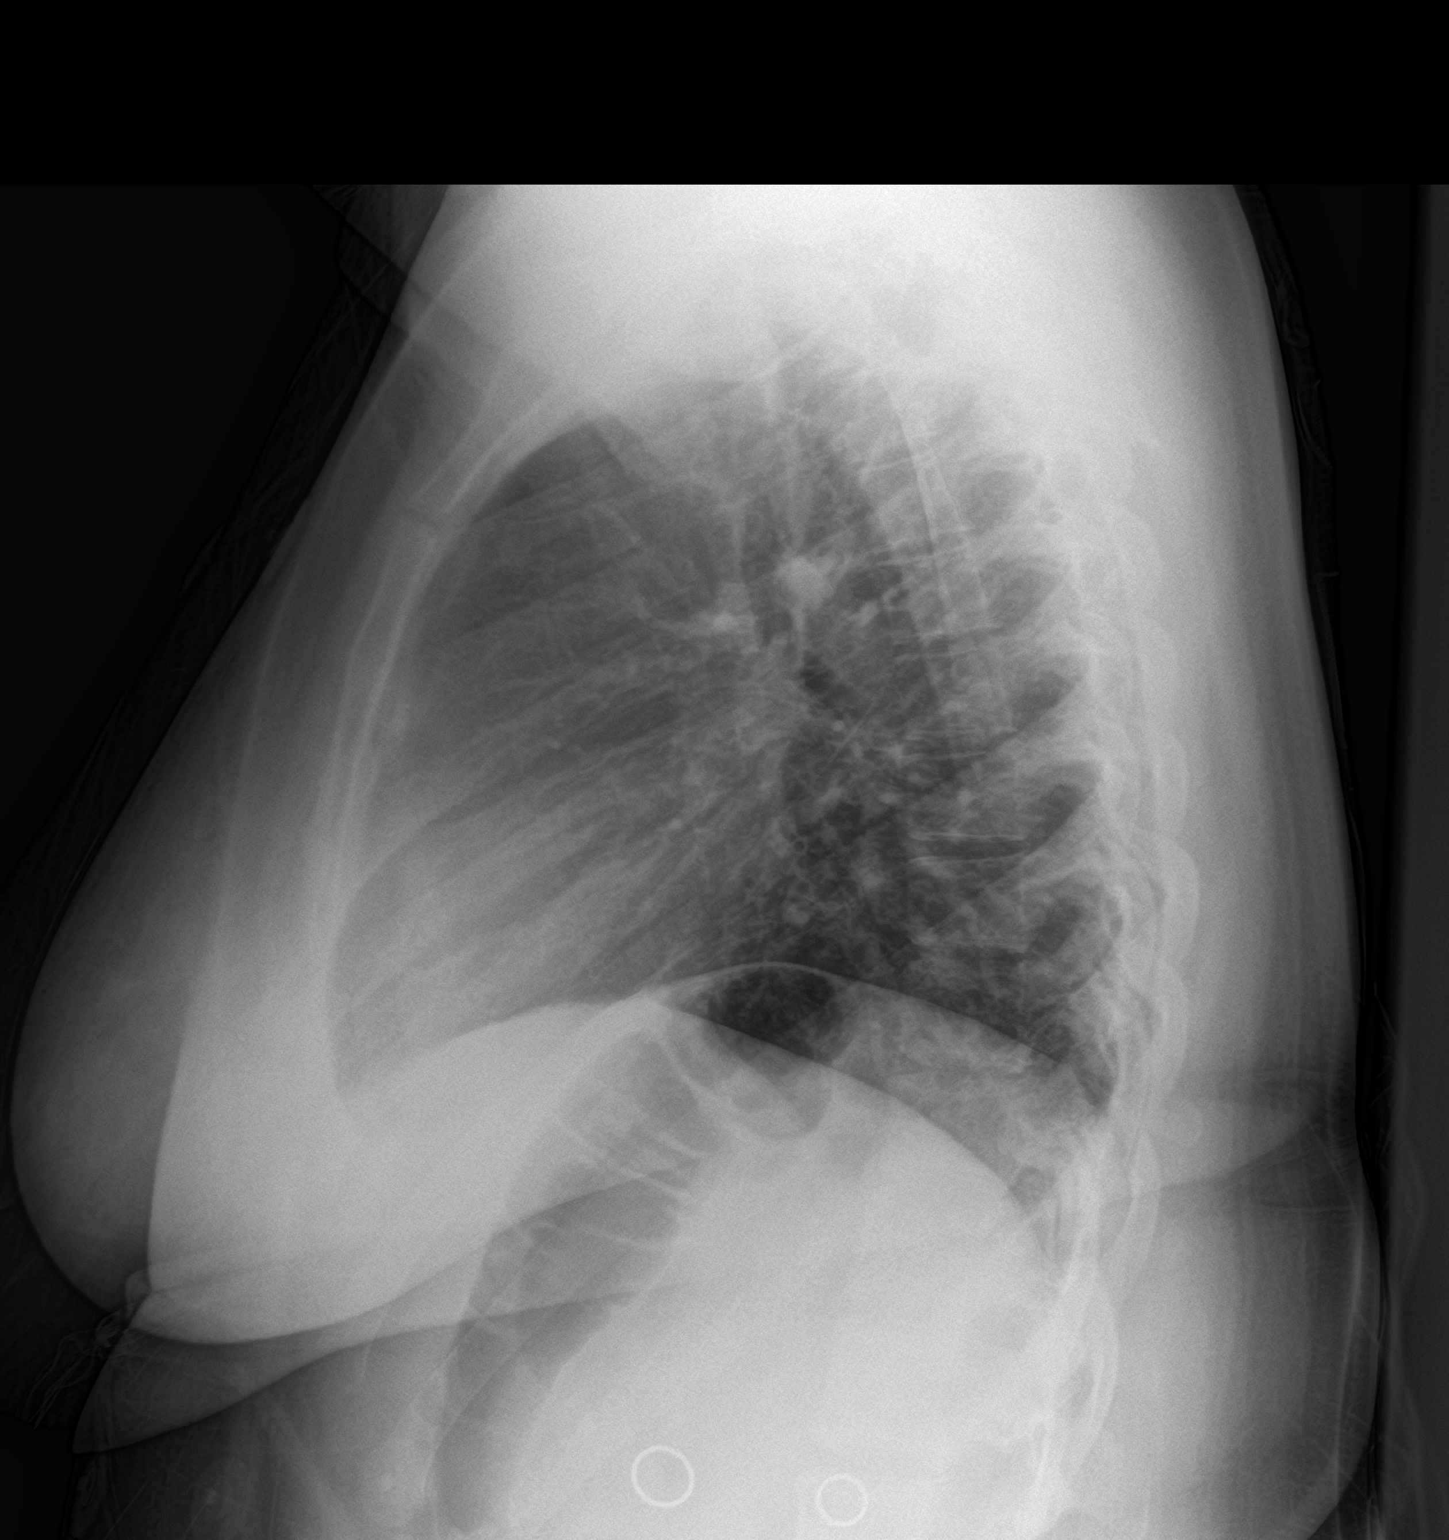

[2 of 2 positions shown; findings below may reference images not displayed]

FINDINGS: The heart size and mediastinal contours are within normal limits.
Both lungs are clear. The visualized skeletal structures are
unremarkable.
IMPRESSION: No active cardiopulmonary disease.

## 2019-10-06 ENCOUNTER — Inpatient Hospital Stay (HOSPITAL_COMMUNITY)
Admission: EM | Admit: 2019-10-06 | Discharge: 2019-10-08 | DRG: 812 | Disposition: A | Discharge status: Home / Self Care | Payer: Medicaid Other | Attending: Internal Medicine | Admitting: Internal Medicine

## 2019-10-06 ENCOUNTER — Emergency Department (HOSPITAL_COMMUNITY): Payer: Medicaid Other

## 2019-10-06 ENCOUNTER — Other Ambulatory Visit: Payer: Self-pay

## 2019-10-06 DIAGNOSIS — N390 Urinary tract infection, site not specified: Secondary | ICD-10-CM | POA: Diagnosis present

## 2019-10-06 DIAGNOSIS — Z6839 Body mass index (BMI) 39.0-39.9, adult: Secondary | ICD-10-CM

## 2019-10-06 DIAGNOSIS — Z832 Family history of diseases of the blood and blood-forming organs and certain disorders involving the immune mechanism: Secondary | ICD-10-CM

## 2019-10-06 DIAGNOSIS — Z20828 Contact with and (suspected) exposure to other viral communicable diseases: Secondary | ICD-10-CM | POA: Diagnosis present

## 2019-10-06 DIAGNOSIS — D57219 Sickle-cell/Hb-C disease with crisis, unspecified: Principal | ICD-10-CM | POA: Diagnosis present

## 2019-10-06 DIAGNOSIS — R519 Headache, unspecified: Secondary | ICD-10-CM | POA: Diagnosis not present

## 2019-10-06 DIAGNOSIS — J029 Acute pharyngitis, unspecified: Secondary | ICD-10-CM | POA: Diagnosis not present

## 2019-10-06 DIAGNOSIS — R399 Unspecified symptoms and signs involving the genitourinary system: Secondary | ICD-10-CM | POA: Diagnosis not present

## 2019-10-06 DIAGNOSIS — R509 Fever, unspecified: Secondary | ICD-10-CM | POA: Diagnosis not present

## 2019-10-06 DIAGNOSIS — M79605 Pain in left leg: Secondary | ICD-10-CM | POA: Diagnosis present

## 2019-10-06 DIAGNOSIS — M79603 Pain in arm, unspecified: Secondary | ICD-10-CM | POA: Diagnosis not present

## 2019-10-06 DIAGNOSIS — R0602 Shortness of breath: Secondary | ICD-10-CM | POA: Diagnosis not present

## 2019-10-06 DIAGNOSIS — R000 Tachycardia, unspecified: Secondary | ICD-10-CM | POA: Diagnosis not present

## 2019-10-06 DIAGNOSIS — R52 Pain, unspecified: Secondary | ICD-10-CM | POA: Diagnosis not present

## 2019-10-06 DIAGNOSIS — D5700 Hb-SS disease with crisis, unspecified: Secondary | ICD-10-CM | POA: Diagnosis present

## 2019-10-06 LAB — CBC WITH DIFFERENTIAL/PLATELET
Abs Immature Granulocytes: 0.02 10*3/uL (ref 0.00–0.07)
Basophils Absolute: 0 10*3/uL (ref 0.0–0.1)
Basophils Relative: 0 %
Eosinophils Absolute: 0 10*3/uL (ref 0.0–0.5)
Eosinophils Relative: 0 %
HCT: 28.8 % — ABNORMAL LOW (ref 36.0–46.0)
Hemoglobin: 10.2 g/dL — ABNORMAL LOW (ref 12.0–15.0)
Immature Granulocytes: 0 %
Lymphocytes Relative: 17 %
Lymphs Abs: 1.3 10*3/uL (ref 0.7–4.0)
MCH: 24.1 pg — ABNORMAL LOW (ref 26.0–34.0)
MCHC: 35.4 g/dL (ref 30.0–36.0)
MCV: 68.1 fL — ABNORMAL LOW (ref 80.0–100.0)
Monocytes Absolute: 0.8 10*3/uL (ref 0.1–1.0)
Monocytes Relative: 10 %
Neutro Abs: 5.4 10*3/uL (ref 1.7–7.7)
Neutrophils Relative %: 73 %
Platelets: 119 10*3/uL — ABNORMAL LOW (ref 150–400)
RBC: 4.23 MIL/uL (ref 3.87–5.11)
RDW: 19.7 % — ABNORMAL HIGH (ref 11.5–15.5)
WBC: 7.4 10*3/uL (ref 4.0–10.5)
nRBC: 0 % (ref 0.0–0.2)

## 2019-10-06 LAB — URINALYSIS, ROUTINE W REFLEX MICROSCOPIC
Bilirubin Urine: NEGATIVE
Glucose, UA: 50 mg/dL — AB
Ketones, ur: NEGATIVE mg/dL
Nitrite: NEGATIVE
Protein, ur: NEGATIVE mg/dL
Specific Gravity, Urine: 1.011 (ref 1.005–1.030)
WBC, UA: >50 WBC/hpf — ABNORMAL HIGH (ref 0–5)
pH: 5 (ref 5.0–8.0)

## 2019-10-06 LAB — COMPREHENSIVE METABOLIC PANEL
ALT: 54 U/L — ABNORMAL HIGH (ref 0–44)
AST: 46 U/L — ABNORMAL HIGH (ref 15–41)
Albumin: 3.9 g/dL (ref 3.5–5.0)
Alkaline Phosphatase: 65 U/L (ref 38–126)
Anion gap: 9 (ref 5–15)
BUN: 8 mg/dL (ref 6–20)
CO2: 22 mmol/L (ref 22–32)
Calcium: 8.5 mg/dL — ABNORMAL LOW (ref 8.9–10.3)
Chloride: 100 mmol/L (ref 98–111)
Creatinine, Ser: 0.66 mg/dL (ref 0.44–1.00)
GFR calc Af Amer: >60 mL/min (ref >60)
GFR calc non Af Amer: >60 mL/min (ref >60)
Glucose, Bld: 123 mg/dL — ABNORMAL HIGH (ref 70–99)
Potassium: 3.5 mmol/L (ref 3.5–5.1)
Sodium: 131 mmol/L — ABNORMAL LOW (ref 135–145)
Total Bilirubin: 1.5 mg/dL — ABNORMAL HIGH (ref 0.3–1.2)
Total Protein: 7.9 g/dL (ref 6.5–8.1)

## 2019-10-06 LAB — GROUP A STREP BY PCR: Group A Strep by PCR: NOT DETECTED

## 2019-10-06 LAB — I-STAT BETA HCG BLOOD, ED (MC, WL, AP ONLY): I-stat hCG, quantitative: <5 m[IU]/mL (ref <5)

## 2019-10-06 LAB — RETICULOCYTES
Immature Retic Fract: 10.7 % (ref 2.3–15.9)
RBC.: 4.23 MIL/uL (ref 3.87–5.11)
Retic Count, Absolute: 94.8 10*3/uL (ref 19.0–186.0)
Retic Ct Pct: 2.2 % (ref 0.4–3.1)

## 2019-10-06 LAB — LACTIC ACID, PLASMA: Lactic Acid, Venous: 0.9 mmol/L (ref 0.5–1.9)

## 2019-10-06 LAB — SARS CORONAVIRUS 2 (TAT 6-24 HRS): SARS Coronavirus 2: NEGATIVE

## 2019-10-06 MED ORDER — HYDROMORPHONE HCL 1 MG/ML IJ SOLN
1.0000 mg | INTRAMUSCULAR | Status: AC
  Administered 2019-10-06: 09:00:00 1 mg via INTRAVENOUS
  Filled 2019-10-06: qty 1

## 2019-10-06 MED ORDER — PROMETHAZINE HCL 25 MG/ML IJ SOLN: 12.5000 mg | Freq: Four times a day (QID) | INTRAMUSCULAR | Status: DC | PRN

## 2019-10-06 MED ORDER — HYDROMORPHONE HCL 1 MG/ML IJ SOLN: 0.5000 mg | INTRAMUSCULAR | Status: AC

## 2019-10-06 MED ORDER — SODIUM CHLORIDE 0.45 % IV SOLN
INTRAVENOUS | Status: DC
  Administered 2019-10-06: 08:00:00 via INTRAVENOUS

## 2019-10-06 MED ORDER — FOLIC ACID 1 MG PO TABS
1.0000 mg | ORAL_TABLET | Freq: Every day | ORAL | Status: DC
  Administered 2019-10-06 – 2019-10-08 (×3): 1 mg via ORAL
  Filled 2019-10-06 (×3): qty 1

## 2019-10-06 MED ORDER — HYDROMORPHONE HCL 1 MG/ML IJ SOLN
1.0000 mg | Freq: Once | INTRAMUSCULAR | Status: AC
  Administered 2019-10-06: 1 mg via INTRAVENOUS
  Filled 2019-10-06: qty 1

## 2019-10-06 MED ORDER — HYDROMORPHONE HCL 1 MG/ML IJ SOLN: 1.0000 mg | INTRAMUSCULAR | Status: AC

## 2019-10-06 MED ORDER — NALOXONE HCL 0.4 MG/ML IJ SOLN: 0.4000 mg | INTRAMUSCULAR | Status: DC | PRN

## 2019-10-06 MED ORDER — SENNOSIDES-DOCUSATE SODIUM 8.6-50 MG PO TABS
1.0000 | ORAL_TABLET | Freq: Two times a day (BID) | ORAL | Status: DC
  Administered 2019-10-07 – 2019-10-08 (×3): 1 via ORAL
  Filled 2019-10-06 (×3): qty 1

## 2019-10-06 MED ORDER — DIPHENHYDRAMINE HCL 50 MG/ML IJ SOLN: 12.5000 mg | Freq: Four times a day (QID) | INTRAMUSCULAR | Status: DC | PRN

## 2019-10-06 MED ORDER — POLYETHYLENE GLYCOL 3350 17 G PO PACK: 17.0000 g | PACK | Freq: Every day | ORAL | Status: DC | PRN

## 2019-10-06 MED ORDER — SODIUM CHLORIDE 0.9 % IV SOLN
INTRAVENOUS | Status: DC
  Administered 2019-10-06: 21:00:00 via INTRAVENOUS

## 2019-10-06 MED ORDER — HYDROMORPHONE HCL 1 MG/ML IJ SOLN
0.5000 mg | INTRAMUSCULAR | Status: AC
  Administered 2019-10-06: 0.5 mg via INTRAVENOUS
  Filled 2019-10-06: qty 1

## 2019-10-06 MED ORDER — MENTHOL 3 MG MT LOZG
1.0000 | LOZENGE | OROMUCOSAL | Status: DC | PRN
  Administered 2019-10-07: 3 mg via ORAL
  Filled 2019-10-06 (×2): qty 9

## 2019-10-06 MED ORDER — SODIUM CHLORIDE 0.45 % IV SOLN: INTRAVENOUS | Status: AC

## 2019-10-06 MED ORDER — SODIUM CHLORIDE 0.9% FLUSH: 3.0000 mL | Freq: Once | INTRAVENOUS | Status: DC

## 2019-10-06 MED ORDER — ENOXAPARIN SODIUM 40 MG/0.4ML ~~LOC~~ SOLN
40.0000 mg | SUBCUTANEOUS | Status: DC
  Administered 2019-10-06 – 2019-10-07 (×2): 40 mg via SUBCUTANEOUS
  Filled 2019-10-06 (×2): qty 0.4

## 2019-10-06 MED ORDER — SODIUM CHLORIDE 0.9% FLUSH: 9.0000 mL | INTRAVENOUS | Status: DC | PRN

## 2019-10-06 MED ORDER — DEXTROSE 5 % AND 0.45 % NACL IV BOLUS
1000.0000 mL | Freq: Once | INTRAVENOUS | Status: AC
  Administered 2019-10-06: 11:00:00 1000 mL via INTRAVENOUS

## 2019-10-06 MED ORDER — KETOROLAC TROMETHAMINE 15 MG/ML IJ SOLN
15.0000 mg | Freq: Four times a day (QID) | INTRAMUSCULAR | Status: DC
  Administered 2019-10-06 – 2019-10-08 (×8): 15 mg via INTRAVENOUS
  Filled 2019-10-06 (×10): qty 1

## 2019-10-06 MED ORDER — ONDANSETRON HCL 4 MG/2ML IJ SOLN
4.0000 mg | Freq: Once | INTRAMUSCULAR | Status: AC
  Administered 2019-10-06: 4 mg via INTRAVENOUS
  Filled 2019-10-06: qty 2

## 2019-10-06 MED ORDER — ONDANSETRON HCL 4 MG/2ML IJ SOLN
4.0000 mg | Freq: Four times a day (QID) | INTRAMUSCULAR | Status: DC | PRN
  Administered 2019-10-06 – 2019-10-08 (×2): 4 mg via INTRAVENOUS
  Filled 2019-10-06 (×3): qty 2

## 2019-10-06 MED ORDER — DIPHENHYDRAMINE HCL 12.5 MG/5ML PO ELIX: 12.5000 mg | ORAL_SOLUTION | Freq: Four times a day (QID) | ORAL | Status: DC | PRN

## 2019-10-06 MED ORDER — HYDROMORPHONE 1 MG/ML IV SOLN
INTRAVENOUS | Status: DC
  Administered 2019-10-06: 30 mg via INTRAVENOUS
  Administered 2019-10-07: 0.3 mg via INTRAVENOUS
  Administered 2019-10-07: 0 mg via INTRAVENOUS
  Administered 2019-10-07: 0.6 mg via INTRAVENOUS
  Administered 2019-10-08 (×2): 0 mg via INTRAVENOUS
  Filled 2019-10-06: qty 30

## 2019-10-06 MED ORDER — SODIUM CHLORIDE 0.9 % IV SOLN
1.0000 g | INTRAVENOUS | Status: DC
  Administered 2019-10-06 – 2019-10-07 (×2): 1 g via INTRAVENOUS
  Filled 2019-10-06: qty 10
  Filled 2019-10-06 (×2): qty 1

## 2019-10-06 NOTE — ED Notes (Signed)
Vomiting EDP aware orders received.

## 2019-10-06 NOTE — Progress Notes (Addendum)
Patient MEWS score is 2 triggering yellow MEWS due to HR in the 120's/ On call MD paged. Dr. Andria Frames  made aware.

## 2019-10-06 NOTE — ED Provider Notes (Signed)
[facility] EMERGENCY DEPARTMENT Provider Note   CSN: [id number] Arrival date & time: 10/06/19  0709     History   Chief Complaint Chief Complaint  Patient presents with  . Sickle Cell Pain Crisis  . Fever    HPI Nicole Case is a 18 y.o. female.      Sickle Cell Pain Crisis Location:  Lower extremity Severity:  Moderate Onset quality:  Gradual Duration:  8 hours Similar to previous crisis episodes: yes   Timing:  Constant Progression:  Worsening Chronicity:  Recurrent Usual hemoglobin level:  10 Frequency of attacks:  30 per year Context: cold exposure   Context: not non-compliance and not pregnancy   Relieved by:  Nothing Worsened by:  Nothing Ineffective treatments:  OTC medications and prescription drugs Associated symptoms: fatigue, fever, headaches, nausea, sore throat and vomiting (1 episode NB/NB)   Associated symptoms: no chest pain, no congestion, no cough, no shortness of breath, no swelling of legs, no vision change and no wheezing   Fever Associated symptoms: headaches, nausea, sore throat and vomiting (1 episode NB/NB)   Associated symptoms: no chest pain, no chills, no congestion, no cough, no dysuria, no ear pain and no rash     Past Medical History:  Diagnosis Date  . Sickle cell anemia (HCC)   . Sickle cell anemia [facility])     Patient Active Problem List   Diagnosis Date Noted  . Sore throat 10/06/2019  . Fever 10/06/2019  . Urinary tract infection symptoms 10/06/2019  . Sickle-cell disease with pain ([place]) 08/01/2017  . Sickle cell disease, type , with unspecified crisis ([place]) 07/31/2017  . Sickle cell crisis ([place]) 05/28/2016  . 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 Sickle cell pain crisis ([place]) 01/29/2014  . Sickle cell pain crisis 12/12/2011    Past Surgical History:  Procedure Laterality Date  . NO PAST SURGERIES       OB History   No obstetric history on file.      Home Medications    Prior to  Admission medications   Not on File    Family History Family History  Problem Relation Age of Onset  . Sickle cell anemia Brother     Social History Social History   Tobacco Use  . Smoking status: Never Smoker  . Smokeless tobacco: Never Used  Substance Use Topics  . Alcohol use: No  . Drug use: No     Allergies   Patient has no known allergies.   Review of Systems Review of Systems  Constitutional: Positive for fatigue and fever. Negative for chills.  HENT: Positive for sore throat. Negative for congestion and ear pain.   Eyes: Negative for pain and visual disturbance.  Respiratory: Negative for cough, shortness of breath and wheezing.   Cardiovascular: Negative for chest pain and palpitations.  Gastrointestinal: Positive for nausea and vomiting (1 episode NB/NB). Negative for abdominal pain.  Genitourinary: Negative for dysuria and hematuria.  Musculoskeletal: Negative for arthralgias and back pain.  Skin: Negative for color change and rash.  Neurological: Positive for headaches. Negative for seizures and syncope.  All other systems reviewed and are negative.    Physical Exam Updated Vital Signs BP 105/89 (BP Location: Left Arm)   Pulse (!) 124   Temp 100.2 F (37.9 C) (Oral)   Resp 16   SpO2 97%   Physical Exam Vitals signs and nursing note reviewed.  Constitutional:      General: She is not in acute distress.  Appearance: She is well-developed. She is obese.  HENT:     Head: Normocephalic and atraumatic.     Mouth/Throat:     Comments: No palpable anterior cervical lymphadenopathy.  Bilateral tonsillar exudates present without any unilateral enlargement or PTA noted. Eyes:     Conjunctiva/sclera: Conjunctivae normal.  Neck:     Musculoskeletal: Neck supple.  Cardiovascular:     Rate and Rhythm: Regular rhythm. Tachycardia present.     Heart sounds: Normal heart sounds. No murmur.  Pulmonary:     Effort: Pulmonary effort is normal. No  respiratory distress.     Breath sounds: Normal breath sounds. No wheezing, rhonchi or rales.  Chest:     Chest wall: No tenderness.  Abdominal:     Palpations: Abdomen is soft.     Tenderness: There is no abdominal tenderness.  Skin:    General: Skin is warm and dry.  Neurological:     Mental Status: She is alert.      ED Treatments / Results  Labs (all labs ordered are listed, but only abnormal results are displayed) Labs Reviewed  COMPREHENSIVE METABOLIC PANEL - Abnormal; Notable for the following components:      Result Value   Sodium 131 (*)    Glucose, Bld 123 (*)    Calcium 8.5 (*)    AST 46 (*)    ALT 54 (*)    Total Bilirubin 1.5 (*)    All other components within normal limits  CBC WITH DIFFERENTIAL/PLATELET - Abnormal; Notable for the following components:   Hemoglobin 10.2 (*)    HCT 28.8 (*)    MCV 68.1 (*)    MCH 24.1 (*)    RDW 19.7 (*)    Platelets 119 (*)    All other components within normal limits  URINALYSIS, ROUTINE W REFLEX MICROSCOPIC - Abnormal; Notable for the following components:   APPearance HAZY (*)    Glucose, UA 50 (*)    Hgb urine dipstick SMALL (*)    Leukocytes,Ua LARGE (*)    WBC, UA >50 (*)    Bacteria, UA MANY (*)    All other components within normal limits  GROUP A STREP BY PCR  SARS CORONAVIRUS 2 (TAT 6-24 HRS)  URINE CULTURE  RETICULOCYTES  LACTIC ACID, PLASMA  LACTIC ACID, PLASMA  HIV ANTIBODY (ROUTINE TESTING W REFLEX)  BASIC METABOLIC PANEL  CBC  I-STAT BETA HCG BLOOD, ED ([facility], [facility], [facility] ONLY)    EKG None  Radiology Dg Chest Port 1 View  Result Date: 10/06/2019 CLINICAL DATA:  Fever.  Sickle cell anemia. EXAM: PORTABLE CHEST 1 VIEW COMPARISON:  06/25/2018 FINDINGS: The heart size and mediastinal contours are within normal limits. Both lungs are clear. The visualized skeletal structures are unremarkable. IMPRESSION: No active disease. Electronically Signed   By: Danae Orleans M.D.   On: 10/06/2019 08:22     Procedures Procedures (including critical care time)  Medications Ordered in ED Medications  sodium chloride flush (NS) 0.9 % injection 3 mL (3 mLs Intravenous Not Given 10/06/19 0833)  0.45 % sodium chloride infusion ( Intravenous Not Given 10/06/19 1544)  polyethylene glycol (MIRALAX / GLYCOLAX) packet 17 g (has no administration in time range)  enoxaparin (LOVENOX) injection 40 mg (40 mg Subcutaneous Given 10/06/19 1818)  ketorolac (TORADOL) 15 MG/ML injection 15 mg (15 mg Intravenous Given 10/06/19 1756)  senna-docusate (Senokot-S) tablet 1 tablet (0 tablets Oral Hold 10/06/19 1310)  naloxone (NARCAN) injection 0.4 mg (has no administration in time  range)    And  sodium chloride flush (NS) 0.9 % injection 9 mL (has no administration in time range)  ondansetron (ZOFRAN) injection 4 mg (4 mg Intravenous Given 10/06/19 1328)  diphenhydrAMINE (BENADRYL) injection 12.5 mg (has no administration in time range)    Or  diphenhydrAMINE (BENADRYL) 12.5 MG/5ML elixir 12.5 mg (has no administration in time range)  HYDROmorphone (DILAUDID) 1 mg/mL PCA injection (30 mg Intravenous Set-up / Initial Syringe 10/06/19 1759)  cefTRIAXone (ROCEPHIN) 1 g in sodium chloride 0.9 % 100 mL IVPB (1 g Intravenous New Bag/Given 10/06/19 1822)  menthol-cetylpyridinium (CEPACOL) lozenge 3 mg (has no administration in time range)  promethazine (PHENERGAN) injection 12.5 mg (has no administration in time range)  0.45 % sodium chloride infusion (has no administration in time range)  folic acid (FOLVITE) tablet 1 mg (1 mg Oral Given 10/06/19 1818)  HYDROmorphone (DILAUDID) injection 0.5 mg (0.5 mg Intravenous Given 10/06/19 0829)    Or  HYDROmorphone (DILAUDID) injection 0.5 mg ( Subcutaneous See Alternative 10/06/19 0829)  HYDROmorphone (DILAUDID) injection 1 mg (1 mg Intravenous Given 10/06/19 [id number])    Or  HYDROmorphone (DILAUDID) injection 1 mg ( Subcutaneous See Alternative 10/06/19 0927)  HYDROmorphone (DILAUDID) injection  1 mg (1 mg Intravenous Given 10/06/19 1048)  dextrose 5 % and 0.45% NaCl 5-0.45 % bolus 1,000 mL (0 mLs Intravenous Stopped 10/06/19 1146)  ondansetron (ZOFRAN) injection 4 mg (4 mg Intravenous Given 10/06/19 1058)     Initial Impression / Assessment and Plan / ED Course  I have reviewed the triage vital signs and the nursing notes.  Pertinent labs & imaging results that were available during my care of the patient were reviewed by me and considered in my medical decision making (see chart for details).        Patient is a 18 year old female with history and physical exam as above presents emergency department for evaluation of fever, as well as bilateral leg pain and sore throat as well as headache.  Bilateral leg pain and headache are consistent with previous sickle cell pain crises.  Sore throat is new and not typically associated with a sickle cell pain crisis for her.  On arrival she is tachycardic to 125 bpm therefore she was given IV fluids.  She was also given IV narcotic pain medications for her sickle cell pain crisis with minimal improvement.  She was given 3 doses of IV pain medication without resolution of her pain.  She was also persistently tachycardic despite receiving these medications and IV fluids therefore with her fever decision was made to proceed with admission to inpatient service for further work-up and management.  Chest x-ray demonstrated no findings concerning for acute chest syndrome.  Lungs are clear to auscultation bilaterally and no complaints of shortness of breath or chest pain.  No COVID-19 no other suspected contacts.  Strep screen obtained as patient is modified Centor criteria score was 3 and strep screen was negative.  She does have exudative pharyngitis without evidence of PTA or other findings concerning for intraoral or neck abscess or severe infectious process.  No neurologic symptoms worrisome for CVA and no other findings concerning for other emergent process  at this time.  Patient to be admitted to inpatient service for further workup and management.  Patient was seen in conjunction with my attending physician Dr. Jeraldine LootsLockwood  Final Clinical Impressions(s) / ED Diagnoses   Final diagnoses:  Sickle cell anemia with pain [facility](HCC)    ED Discharge Orders  None       Jonna Clark, MD 10/06/19 [facility] [place]    Gerhard Munch, MD 10/09/19 [phone]

## 2019-10-06 NOTE — ED Triage Notes (Signed)
To ED via [facility] from work--c/o sickle cell crisis -- fever started last night -- legs ache, denies shortness of breath,

## 2019-10-06 NOTE — H&P (Addendum)
H&P  Patient Demographics:  Nicole Case, is a 18 y.o. female  MRN: [id number]   DOB - May 07, 2001  Admit Date - 10/06/2019  Outpatient Primary MD for the patient is P[facility], [facility] (Inactive)  Chief Complaint  Patient presents with  . Sickle Cell Pain Crisis  . Fever      HPI:   Nicole Case  is a 18 y.o. female with a medical history significant for sickle cell disease, type [place] presented to ER complaining of lower extremity pain.  Pain is consistent with patient's typical sickle cell crisis.  She says that she was in her usual state of health until developing sudden pain 1 day prior.  Patient also states that she had several episodes of vomiting.  She is also complaining of sore throat.  Pain is primarily to lower extremities characterized as constant and throbbing.  Her pain intensity is 8/10.  She has not identified any palliative or provocative factors concerning current crisis.  Patient states that she is not followed by PCP or hematology.  She mostly manages pain with ibuprofen, Tylenol, or Goody's headache powder.  She states that it has been several years since she was prescribed oxycodone.  ER course: Sodium 131, glucose 123, AST 46, ALT 54, and total bilirubin 1.5.  Hemoglobin 10.2, which appears to be consistent with patient's baseline.  Platelets 119.  BP 109/75, P1 11, RR 18, oxygen saturation 99% on RA.  Patient was febrile, temperature was 100.6.  Rapid strep screen negative, COVID-19 test negative, and lactic acid unremarkable.  Urinalysis shows WBCs greater than 50 and many bacteria, urine culture pending.  Chest x-ray shows no acute cardiopulmonary process. Patient admitted to [facility] for management of sickle cell pain crisis.   Review of systems:  Review of Systems  Constitutional: Positive for fever. Negative for malaise/fatigue and weight loss.  HENT: Negative.   Eyes: Negative.   Respiratory: Negative.   Cardiovascular: Negative.    Gastrointestinal: Negative.   Genitourinary: Positive for urgency. Negative for dysuria, flank pain and hematuria.       Urine odor, dark  Musculoskeletal: Positive for joint pain (Bilateral lower extremities).  Skin: Negative.   Neurological: Negative.   Endo/Heme/Allergies: Negative.   Psychiatric/Behavioral: Negative.     A full 10 point Review of Systems was done, except as stated above, all other Review of Systems were negative.  With Past History of the following :   Past Medical History:  Diagnosis Date  . Sickle cell anemia (HCC)   . Sickle cell anemia (HCC)       Past Surgical History:  Procedure Laterality Date  . NO PAST SURGERIES       Social History:   Social History   Tobacco Use  . Smoking status: Never Smoker  . Smokeless tobacco: Never Used  Substance Use Topics  . Alcohol use: No     Lives - At home   Family History :   Family History  Problem Relation Age of Onset  . Sickle cell anemia Brother      Home Medications:   Prior to Admission medications   Not on File     Allergies:   No Known Allergies   Physical Exam:   Vitals:   Vitals:   10/06/19 1100 10/06/19 1130  BP: 109/75   Pulse: (!) 111 (!) 114  Resp: 18 (!) 23  Temp:    SpO2: 99% 96%   Physical Exam Constitutional:  Appearance: She is obese.  HENT:     Mouth/Throat:     Pharynx: Posterior oropharyngeal erythema present. No oropharyngeal exudate.  Eyes:     General: No scleral icterus.    Pupils: Pupils are equal, round, and reactive to light.  Neck:     Musculoskeletal: Normal range of motion.  Cardiovascular:     Rate and Rhythm: Normal rate and regular rhythm.     Pulses: Normal pulses.     Heart sounds: Normal heart sounds.  Pulmonary:     Effort: Pulmonary effort is normal.     Breath sounds: Normal breath sounds.  Abdominal:     General: Bowel sounds are normal.  Musculoskeletal: Normal range of motion.  Skin:    General: Skin is warm.   Neurological:     General: No focal deficit present.     Mental Status: She is alert and oriented to person, place, and time. Mental status is at baseline.  Psychiatric:        Mood and Affect: Mood normal.        Behavior: Behavior normal.        Thought Content: Thought content normal.        Judgment: Judgment normal.      Data Review:   CBC Recent Labs  Lab 10/06/19 0809  WBC 7.4  HGB 10.2*  HCT 28.8*  PLT 119*  MCV 68.1*  MCH 24.1*  MCHC 35.4  RDW 19.7*  LYMPHSABS 1.3  MONOABS 0.8  EOSABS 0.0  BASOSABS 0.0   ------------------------------------------------------------------------------------------------------------------  Chemistries  Recent Labs  Lab 10/06/19 0809  NA 131*  K 3.5  CL 100  CO2 22  GLUCOSE 123*  BUN 8  CREATININE 0.66  CALCIUM 8.5*  AST 46*  ALT 54*  ALKPHOS 65  BILITOT 1.5*   ------------------------------------------------------------------------------------------------------------------ CrCl cannot be calculated (Unknown ideal weight.). ------------------------------------------------------------------------------------------------------------------ No results for input(s): TSH, T4TOTAL, T3FREE, THYROIDAB in the last 72 hours.  Invalid input(s): FREET3  Coagulation profile No results for input(s): INR, PROTIME in the last 168 hours. ------------------------------------------------------------------------------------------------------------------- No results for input(s): DDIMER in the last 72 hours. -------------------------------------------------------------------------------------------------------------------  Cardiac Enzymes No results for input(s): CKMB, TROPONINI, MYOGLOBIN in the last 168 hours.  Invalid input(s): CK ------------------------------------------------------------------------------------------------------------------ No results found for:  BNP  ---------------------------------------------------------------------------------------------------------------  Urinalysis    Component Value Date/Time   COLORURINE YELLOW 11/11/2018 1918   APPEARANCEUR CLEAR 11/11/2018 1918   LABSPEC 1.013 11/11/2018 1918   PHURINE 5.0 11/11/2018 1918   GLUCOSEU NEGATIVE 11/11/2018 1918   HGBUR NEGATIVE 11/11/2018 1918   BILIRUBINUR NEGATIVE 11/11/2018 1918   KETONESUR NEGATIVE 11/11/2018 1918   PROTEINUR NEGATIVE 11/11/2018 1918   UROBILINOGEN 0.2 07/14/2014 1858   NITRITE NEGATIVE 11/11/2018 1918   LEUKOCYTESUR NEGATIVE 11/11/2018 1918    ----------------------------------------------------------------------------------------------------------------   Imaging Results:    Dg Chest Port 1 View  Result Date: 10/06/2019 CLINICAL DATA:  Fever.  Sickle cell anemia. EXAM: PORTABLE CHEST 1 VIEW COMPARISON:  06/25/2018 FINDINGS: The heart size and mediastinal contours are within normal limits. Both lungs are clear. The visualized skeletal structures are unremarkable. IMPRESSION: No active disease. Electronically Signed   By: Danae Orleans M.D.   On: 10/06/2019 08:22      Assessment & Plan:  Principal Problem:   Sickle cell pain crisis (HCC) Active Problems:   Sore throat   Fever   Urinary tract infection symptoms  Sickle cell pain crisis:  Admit, start IVF D5 .45% Saline @ 100 mls/hour, Weight based Dilaudid PCA  started within 30 minutes of admission, IV Toradol 15 mg Q 6 H, Monitor vitals very closely, Re-evaluate pain scale regularly, 2 L of Oxygen by [place], Patient will be re-evaluated for pain in the context of function and relationship to baseline as care progresses.  Sickle cell anemia:  Hemoglobin 10.2, consistent with patient's baseline.  No clinical indication for blood transfusion. Folic acid 1 mg daily  Urinary Tract Infection: Abnormal urinalysis, shows many bacteria and large leukocytes. Urine culture pending. Rocephin IV 1 g  daily  Sore throat: Rapid strep screen negative. Continue supportive care  Morbid obesity: Patient counseled extensively. Expressed understanding  DVT Prophylaxis: Subcut Lovenox and SCDs  AM Labs Ordered, also please review Full Orders  Family Communication: Admission, patient's condition and plan of care including tests being ordered have been discussed with the patient who indicate understanding and agree with the plan and Code Status.  Code Status: Full Code  Consults called: None    Admission status: Inpatient    Time spent in minutes : 50 minutes  [place], MSN, FNP-C Patient [facility] [address] [place], [place] [address] [phone]  10/06/2019 at 12:07 PM

## 2019-10-07 ENCOUNTER — Encounter (HOSPITAL_COMMUNITY): Payer: Self-pay | Admitting: *Deleted

## 2019-10-07 ENCOUNTER — Inpatient Hospital Stay (HOSPITAL_COMMUNITY): Payer: Medicaid Other

## 2019-10-07 LAB — CBC
HCT: 26 % — ABNORMAL LOW (ref 36.0–46.0)
Hemoglobin: 8.8 g/dL — ABNORMAL LOW (ref 12.0–15.0)
MCH: 23.5 pg — ABNORMAL LOW (ref 26.0–34.0)
MCHC: 33.8 g/dL (ref 30.0–36.0)
MCV: 69.5 fL — ABNORMAL LOW (ref 80.0–100.0)
Platelets: 95 10*3/uL — ABNORMAL LOW (ref 150–400)
RBC: 3.74 MIL/uL — ABNORMAL LOW (ref 3.87–5.11)
RDW: 19.5 % — ABNORMAL HIGH (ref 11.5–15.5)
WBC: 5.6 10*3/uL (ref 4.0–10.5)
nRBC: 0 % (ref 0.0–0.2)

## 2019-10-07 LAB — URINE CULTURE

## 2019-10-07 LAB — BASIC METABOLIC PANEL
Anion gap: 6 (ref 5–15)
BUN: 7 mg/dL (ref 6–20)
CO2: 25 mmol/L (ref 22–32)
Calcium: 8 mg/dL — ABNORMAL LOW (ref 8.9–10.3)
Chloride: 103 mmol/L (ref 98–111)
Creatinine, Ser: 0.63 mg/dL (ref 0.44–1.00)
GFR calc Af Amer: >60 mL/min (ref >60)
GFR calc non Af Amer: >60 mL/min (ref >60)
Glucose, Bld: 97 mg/dL (ref 70–99)
Potassium: 4.1 mmol/L (ref 3.5–5.1)
Sodium: 134 mmol/L — ABNORMAL LOW (ref 135–145)

## 2019-10-07 MED ORDER — ACETAMINOPHEN 325 MG PO TABS: 650.0000 mg | ORAL_TABLET | Freq: Four times a day (QID) | ORAL | Status: DC | PRN

## 2019-10-07 MED ORDER — SODIUM CHLORIDE 0.45 % IV SOLN
INTRAVENOUS | Status: DC
  Administered 2019-10-07: 11:00:00 via INTRAVENOUS

## 2019-10-07 MED ORDER — OXYCODONE HCL 5 MG PO TABS
5.0000 mg | ORAL_TABLET | ORAL | Status: DC | PRN
  Administered 2019-10-07 – 2019-10-08 (×3): 5 mg via ORAL
  Filled 2019-10-07 (×3): qty 1

## 2019-10-07 NOTE — Progress Notes (Signed)
Patient declined to have influenza vaccine

## 2019-10-07 NOTE — Progress Notes (Signed)
Subjective: Nicole Case, a 18 year old female with a medical history significant for sickle cell disease was admitted in sickle cell pain crisis.   Patient says that pain intensity has not improved despite PCA dilaudid. Pain intensity is 9/10.  Patient has been febrile overnight. IV antibiotics were initiated. Patient's chest xray showed no acute cardiopulmonary process.  COVID-19 test negative.  Patient denies headache, chest pains, dizziness, paresthesias, shortness of breath, heart palpitations, dysuria, urinary frequency, nausea, vomiting, or diarrhea.   Objective:  Vital signs in last 24 hours:  Vitals:   10/07/19 0500 10/07/19 0856 10/07/19 1033 10/07/19 1335  BP: 113/69 100/63  98/63  Pulse: (!) 102 (!) 109  (!) 102  Resp: 20 18 13 18   Temp: (!) 100.6 F (38.1 C) 99.7 F (37.6 C)  99.1 F (37.3 C)  TempSrc: Oral Oral  Oral  SpO2: 100% 100% 100% 100%  Weight:      Height:        Intake/Output from previous day:   Intake/Output Summary (Last 24 hours) at 10/07/2019 1700 Last data filed at 10/07/2019 0300 Gross per 24 hour  Intake 1242 ml  Output -  Net 1242 ml   Physical Exam Constitutional:      Appearance: She is obese.  HENT:     Head: Normocephalic.     Mouth/Throat:     Mouth: Mucous membranes are moist.     Pharynx: Oropharynx is clear.  Eyes:     Pupils: Pupils are equal, round, and reactive to light.  Cardiovascular:     Rate and Rhythm: Regular rhythm. Tachycardia present.     Pulses: Normal pulses.  Pulmonary:     Effort: Pulmonary effort is normal.     Breath sounds: Normal breath sounds.  Abdominal:     General: Abdomen is flat. Bowel sounds are normal.  Skin:    General: Skin is warm.  Neurological:     General: No focal deficit present.     Mental Status: Mental status is at baseline.  Psychiatric:        Mood and Affect: Mood normal.        Behavior: Behavior normal.        Thought Content: Thought content normal.        Judgment:  Judgment normal.    Lab Results:  Basic Metabolic Panel:    Component Value Date/Time   NA 134 (L) 10/07/2019 0504   K 4.1 10/07/2019 0504   CL 103 10/07/2019 0504   CO2 25 10/07/2019 0504   BUN 7 10/07/2019 0504   CREATININE 0.63 10/07/2019 0504   GLUCOSE 97 10/07/2019 0504   CALCIUM 8.0 (L) 10/07/2019 0504   CBC:    Component Value Date/Time   WBC 5.6 10/07/2019 0504   HGB 8.8 (L) 10/07/2019 0504   HCT 26.0 (L) 10/07/2019 0504   PLT 95 (L) 10/07/2019 0504   MCV 69.5 (L) 10/07/2019 0504   NEUTROABS 5.4 10/06/2019 0809   LYMPHSABS 1.3 10/06/2019 0809   MONOABS 0.8 10/06/2019 0809   EOSABS 0.0 10/06/2019 0809   BASOSABS 0.0 10/06/2019 0809    Recent Results (from the past 240 hour(s))  Group A Strep by PCR     Status: None   Collection Time: 10/06/19  8:35 AM   Specimen: Throat; Sterile Swab  Result Value Ref Range Status   Group A Strep by PCR NOT DETECTED NOT DETECTED Final    Comment: Performed at [facility], [address] [place], [place] [address]  SARS CORONAVIRUS 2 (TAT 6-24 HRS) Nasopharyngeal Nasopharyngeal Swab     Status: None   Collection Time: 10/06/19 11:02 AM   Specimen: Nasopharyngeal Swab  Result Value Ref Range Status   SARS Coronavirus 2 NEGATIVE NEGATIVE Final    Comment: (NOTE) SARS-CoV-2 target nucleic acids are NOT DETECTED. The SARS-CoV-2 RNA is generally detectable in upper and lower respiratory specimens during the acute phase of infection. Negative results do not preclude SARS-CoV-2 infection, do not rule out co-infections with other pathogens, and should not be used as the sole basis for treatment or other patient management decisions. Negative results must be combined with clinical observations, patient history, and epidemiological information. The expected result is Negative. Fact Sheet for Patients: HairSlick.nohttps://www.fda.gov/media/138098/download Fact Sheet for Healthcare  Providers: quierodirigir.comhttps://www.fda.gov/media/138095/download This test is not yet approved or cleared by the [place] FDA and  has been authorized for detection and/or diagnosis of SARS-CoV-2 by FDA under an Emergency Use Authorization (EUA). This EUA will remain  in effect (meaning this test can be used) for the duration of the COVID-19 declaration under Section 56 4(b)(1) of the Act, 21 U.S.C. section 360bbb-3(b)(1), unless the authorization is terminated or revoked sooner. Performed at [facility], [address] [place], [place] [address]   Urine culture     Status: Abnormal   Collection Time: 10/06/19  2:43 PM   Specimen: Urine, Random  Result Value Ref Range Status   Specimen Description URINE, RANDOM  Final   Special Requests   Final    NONE Performed at [facility], [address] [place], [place] [address]    Culture MULTIPLE SPECIES PRESENT, SUGGEST RECOLLECTION (A)  Final   Report Status 10/07/2019 FINAL  Final    Studies/Results: Dg Chest 2 View  Result Date: 10/07/2019 CLINICAL DATA:  Fever, weakness, shortness of breath, history of sickle cell anemia. EXAM: CHEST - 2 VIEW COMPARISON:  Chest radiograph 10/06/2019 FINDINGS: Heart size within normal limits. New from prior exam 10/06/2019, there is patchy airspace disease within the mid right lung. No evidence of pleural effusion or pneumothorax. No acute bony abnormality. IMPRESSION: Patchy airspace disease within mid right lung, new as compared to chest radiograph 10/06/2019. Electronically Signed   By: Jackey LogeKyle  Golden DO   On: 10/07/2019 09:27   Dg Chest Port 1 View  Result Date: 10/06/2019 CLINICAL DATA:  Fever.  Sickle cell anemia. EXAM: PORTABLE CHEST 1 VIEW COMPARISON:  06/25/2018 FINDINGS: The heart size and mediastinal contours are within normal limits. Both lungs are clear. The visualized skeletal structures are unremarkable. IMPRESSION: No active disease. Electronically Signed   By: Danae OrleansJohn A Stahl M.D.    On: 10/06/2019 08:22    Medications: Scheduled Meds: . enoxaparin (LOVENOX) injection  40 mg Subcutaneous Q24H  . folic acid  1 mg Oral Daily  . HYDROmorphone   Intravenous Q4H  . ketorolac  15 mg Intravenous Q6H  . senna-docusate  1 tablet Oral BID  . sodium chloride flush  3 mL Intravenous Once   Continuous Infusions: . sodium chloride 50 mL/hr at 10/07/19 1030  . cefTRIAXone (ROCEPHIN)  IV Stopped (10/06/19 1852)   PRN Meds:.acetaminophen, diphenhydrAMINE **OR** diphenhydrAMINE, menthol-cetylpyridinium, naloxone **AND** sodium chloride flush, ondansetron (ZOFRAN) IV, oxyCODONE, polyethylene glycol, promethazine  Consultants: None  Procedures: None  Antibiotics: IV Rocephin Assessment/Plan: Principal Problem:   Sickle cell pain crisis (HCC) Active Problems:   Sore throat   Fever   Urinary tract infection symptoms  Sickle cell pain crisis: Continue IV fluids, 0.45% saline at 50 mL/h Continue full dose PCA, no change in settings Oxycodone 5 mg every 4 hours as needed for moderate to severe breakthrough pain Continue to monitor vital signs closely Supplemental oxygen as needed  Sickle cell anemia: Hemoglobin 8.8, appears to be below baseline.  No clinical indication for blood transfusion at this time.  Repeat CBC in a.m.  Probable urinary tract infection: Abnormal urinalysis.  Continue IV Rocephin.  Urine culture shows multiple species present, needs recollection.    Fever: Patient febrile overnight.  Max temperature 100.6 F.  Chest x-ray shows no acute cardiopulmonary process, lactic acid negative, COVID-19 negative.  Patient complained of sore throat pain on yesterday, rapid strep screen negative.  Sore throat: Patient says that throat pain is improved.  Rapid strep test negative.  Continue supportive care.  Morbid obesity: Patient counseled extensively on admission. Code Status: Full Code Family Communication: N/A Disposition Plan: Not yet ready for  discharge   [place], MSN, FNP-C Patient [facility] [address] [place],  [address] [phone]  If 5PM-7AM, please contact night-coverage.  10/07/2019, 5:00 PM  LOS: 1 day

## 2019-10-08 ENCOUNTER — Encounter: Payer: Self-pay | Admitting: Family Medicine

## 2019-10-08 LAB — CBC
HCT: 27.7 % — ABNORMAL LOW (ref 36.0–46.0)
Hemoglobin: 9.5 g/dL — ABNORMAL LOW (ref 12.0–15.0)
MCH: 23.7 pg — ABNORMAL LOW (ref 26.0–34.0)
MCHC: 34.3 g/dL (ref 30.0–36.0)
MCV: 69.1 fL — ABNORMAL LOW (ref 80.0–100.0)
Platelets: 108 10*3/uL — ABNORMAL LOW (ref 150–400)
RBC: 4.01 MIL/uL (ref 3.87–5.11)
RDW: 19.8 % — ABNORMAL HIGH (ref 11.5–15.5)
WBC: 4.9 10*3/uL (ref 4.0–10.5)
nRBC: 0 % (ref 0.0–0.2)

## 2019-10-08 MED ORDER — ACETAMINOPHEN 325 MG PO TABS: 650.0000 mg | ORAL_TABLET | Freq: Four times a day (QID) | ORAL | 0 refills | Status: DC | PRN

## 2019-10-08 MED ORDER — IBUPROFEN 800 MG PO TABS: 800.0000 mg | ORAL_TABLET | Freq: Three times a day (TID) | ORAL | 0 refills | Status: DC | PRN

## 2019-10-08 MED ORDER — OXYCODONE HCL 5 MG PO TABS: 5.0000 mg | ORAL_TABLET | ORAL | 0 refills | Status: DC | PRN

## 2019-10-08 MED ORDER — FOLIC ACID 1 MG PO TABS: 1.0000 mg | ORAL_TABLET | Freq: Every day | ORAL | 3 refills | Status: AC

## 2019-10-08 MED ORDER — AMOXICILLIN-POT CLAVULANATE 875-125 MG PO TABS: 1.0000 | ORAL_TABLET | Freq: Two times a day (BID) | ORAL | 0 refills | Status: AC

## 2019-10-08 NOTE — Discharge Summary (Signed)
Physician Discharge Summary  Nicole Case YQI:[id number] DOB: [birth year] DOA: 10/06/2019  PCP: [facility])  Admit date: 10/06/2019  Discharge date: 10/08/2019  Discharge Diagnoses:  Principal Problem:   Case cell pain crisis ([place]) Active Problems:   Sore throat   Fever   Urinary tract infection symptoms   Discharge Condition: Stable  Disposition:  Follow-up Information    [name], FNP Follow up in 2 week(s).   Specialty: Family Medicine Contact information: [address]. [place] [address] [phone]          Pt is discharged home in good condition and is to follow up with Nicole Sickle, FNP this week to have labs evaluated. Nicole Case is instructed to increase activity slowly and balance with rest for the next few days, and use prescribed medication to complete treatment of pain  Diet: Regular Wt Readings from Last 3 Encounters:  10/08/19 109.6 kg  04/25/19 88.5 kg (97 %, Z= 1.86)*  12/31/18 88.5 kg (97 %, Z= 1.87)*   * Growth percentiles are based on CDC (Girls, 2-20 Years) data.    History of present illness:  Nicole Case, a 18 year old female with medical history significant for Case cell disease, type O'Brien presented to ER complaining of lower extremity pain.  Pain is consistent with patient's typical Case cell pain crisis.  Patient states that she was in her usual state of health until developing sudden pain 1 day prior.  Patient also states that she had several episodes of vomiting.  Also, patient endorses sore throat.  Pain is primarily to lower extremities characterized as constant throbbing.  Pain intensity is 8/10.  She has not identified any palliative or provocative factors concerning current crisis.  Patient states that she is not followed by PCP or hematology.  She mostly manages pain with ibuprofen, Tylenol, or Goody's headache powder.  She states it has been several years since she was  prescribed opiate medications.  ER course: Sodium 131, glucose 123, AST 46, ALT 54, and total bilirubin 1.5.  Hemoglobin 10.2, which appears to be consistent with patient's baseline.  Platelets 119.  BP 109/75, pulse 111, respiratory rate 18, oxygen saturation 99% on RA.  Patient was febrile on admission, temperature was 100.6 at max.  Rapid strep screen negative, COVID-19 test negative, lactic acid unremarkable.  Urinalysis shows WBCs greater than 50 and many bacteria, urine culture pending.  Chest x-ray shows no acute cardiopulmonary process.  Patient admitted to [facility] for management of Case cell pain crisis.  Hospital Course:  Case cell disease with pain crisis: Patient was admitted for Case cell pain crisis and managed appropriately with IVF, IV Dilaudid via PCA and IV Toradol, as well as other adjunct therapies per Case cell pain management protocols.  Patient was on full dose Dilaudid PCA which was weaned appropriately.  Oxycodone 5 mg every 4 hours was introduced, pain controlled on this medication.  Current pain intensity 4/10.  Patient is requiring discharge home.  Patient will return home with oxycodone 5 mg every 4 hours as needed #20.  Also, ibuprofen 800 mg every 8 hours as needed with food for mild to moderate pain.  Urinary tract infection: Urinalysis showed WBCs greater than 50 and many bacteria.  Urine culture showed multiple species, recollection recommended.  Patient will discharge home with Augmentin 875-125 for an additional 5 days.  Patient is alert, oriented, and ambulating without assistance.  She is afebrile saturation at 100%  on RA.  Patient will follow-up with this provider in 2 weeks.   Patient was discharged home today in a hemodynamically stable condition.   Discharge Exam: Vitals:   10/08/19 0816 10/08/19 0938  BP:  112/72  Pulse:  96  Resp: 12 20  Temp:  99.1 F (37.3 C)  SpO2: 100% 100%   Vitals:   10/08/19 0416 10/08/19 0532 10/08/19  0816 10/08/19 0938  BP:  111/73  112/72  Pulse:  93  96  Resp: 15 17 12 20   Temp:  98.3 F (36.8 C)  99.1 F (37.3 C)  TempSrc:  Oral  Oral  SpO2: 100% 100% 100% 100%  Weight:  109.6 kg    Height:        General appearance : Awake, alert, not in any distress. Speech Clear. Not toxic looking HEENT: Atraumatic and Normocephalic, pupils equally reactive to light and accomodation Neck: Supple, no JVD. No cervical lymphadenopathy.  Chest: Good air entry bilaterally, no added sounds  CVS: S1 S2 regular, no murmurs.  Abdomen: Bowel sounds present, Non tender and not distended with no gaurding, rigidity or rebound. Extremities: B/L Lower Ext shows no edema, both legs are warm to touch Neurology: Awake alert, and oriented X 3, CN II-XII intact, Non focal Skin: No Rash  Discharge Instructions  Discharge Instructions    Discharge patient   Complete by: As directed    Discharge disposition: 01-Home or Self Care   Discharge patient date: 10/08/2019     Allergies as of 10/08/2019   No Known Allergies     Medication List    TAKE these medications   acetaminophen 325 MG tablet Commonly known as: TYLENOL Take 2 tablets (650 mg total) by mouth every 6 (six) hours as needed for mild pain, moderate pain, fever or headache.   amoxicillin-clavulanate 875-125 MG tablet Commonly known as: Augmentin Take 1 tablet by mouth 2 (two) times daily for 5 days.   folic acid 1 MG tablet Commonly known as: FOLVITE Take 1 tablet (1 mg total) by mouth daily.   ibuprofen 800 MG tablet Commonly known as: ADVIL Take 1 tablet (800 mg total) by mouth every 8 (eight) hours as needed.   oxyCODONE 5 MG immediate release tablet Commonly known as: Roxicodone Take 1 tablet (5 mg total) by mouth every 4 (four) hours as needed for severe pain.       The results of significant diagnostics from this hospitalization (including imaging, microbiology, ancillary and laboratory) are listed below for reference.     Significant Diagnostic Studies: Dg Chest 2 View  Result Date: 10/07/2019 CLINICAL DATA:  Fever, weakness, shortness of breath, history of Case cell anemia. EXAM: CHEST - 2 VIEW COMPARISON:  Chest radiograph 10/06/2019 FINDINGS: Heart size within normal limits. New from prior exam 10/06/2019, there is patchy airspace disease within the mid right lung. No evidence of pleural effusion or pneumothorax. No acute bony abnormality. IMPRESSION: Patchy airspace disease within mid right lung, new as compared to chest radiograph 10/06/2019. Electronically Signed   By: 13/01/2019 DO   On: 10/07/2019 09:27   Dg Chest Port 1 View  Result Date: 10/06/2019 CLINICAL DATA:  Fever.  Case cell anemia. EXAM: PORTABLE CHEST 1 VIEW COMPARISON:  06/25/2018 FINDINGS: The heart size and mediastinal contours are within normal limits. Both lungs are clear. The visualized skeletal structures are unremarkable. IMPRESSION: No active disease. Electronically Signed   By: 06/27/2018 M.D.   On: 10/06/2019 08:22    Microbiology:  Recent Results (from the past 240 hour(s))  Group A Strep by PCR     Status: None   Collection Time: 10/06/19  8:35 AM   Specimen: Throat; Sterile Swab  Result Value Ref Range Status   Group A Strep by PCR NOT DETECTED NOT DETECTED Final    Comment: Performed at [facility], [address] [place], [place] [address]  SARS CORONAVIRUS 2 (TAT 6-24 HRS) Nasopharyngeal Nasopharyngeal Swab     Status: None   Collection Time: 10/06/19 11:02 AM   Specimen: Nasopharyngeal Swab  Result Value Ref Range Status   SARS Coronavirus 2 NEGATIVE NEGATIVE Final    Comment: (NOTE) SARS-CoV-2 target nucleic acids are NOT DETECTED. The SARS-CoV-2 RNA is generally detectable in upper and lower respiratory specimens during the acute phase of infection. Negative results do not preclude SARS-CoV-2 infection, do not rule out co-infections with other pathogens, and should not be used as the sole basis for  treatment or other patient management decisions. Negative results must be combined with clinical observations, patient history, and epidemiological information. The expected result is Negative. Fact Sheet for Patients: HairSlick.nohttps://www.fda.gov/media/138098/download Fact Sheet for Healthcare Providers: quierodirigir.comhttps://www.fda.gov/media/138095/download This test is not yet approved or cleared by the [place] FDA and  has been authorized for detection and/or diagnosis of SARS-CoV-2 by FDA under an Emergency Use Authorization (EUA). This EUA will remain  in effect (meaning this test can be used) for the duration of the COVID-19 declaration under Section 56 4(b)(1) of the Act, 21 U.S.C. section 360bbb-3(b)(1), unless the authorization is terminated or revoked sooner. Performed at [facility], [address] [place], [place] [address]   Urine culture     Status: Abnormal   Collection Time: 10/06/19  2:43 PM   Specimen: Urine, Random  Result Value Ref Range Status   Specimen Description URINE, RANDOM  Final   Special Requests   Final    NONE Performed at [facility], [address] [place], [place] [address]    Culture MULTIPLE SPECIES PRESENT, SUGGEST RECOLLECTION (A)  Final   Report Status 10/07/2019 FINAL  Final     Labs: Basic Metabolic Panel: Recent Labs  Lab 10/06/19 0809 10/07/19 0504  NA 131* 134*  K 3.5 4.1  CL 100 103  CO2 22 25  GLUCOSE 123* 97  BUN 8 7  CREATININE 0.66 0.63  CALCIUM 8.5* 8.0*   Liver Function Tests: Recent Labs  Lab 10/06/19 0809  AST 46*  ALT 54*  ALKPHOS 65  BILITOT 1.5*  PROT 7.9  ALBUMIN 3.9   No results for input(s): LIPASE, AMYLASE in the last 168 hours. No results for input(s): AMMONIA in the last 168 hours. CBC: Recent Labs  Lab 10/06/19 0809 10/07/19 0504 10/08/19 0838  WBC 7.4 5.6 4.9  NEUTROABS 5.4  --   --   HGB 10.2* 8.8* 9.5*  HCT 28.8* 26.0* 27.7*  MCV 68.1* 69.5* 69.1*  PLT 119* 95* 108*   Cardiac  Enzymes: No results for input(s): CKTOTAL, CKMB, CKMBINDEX, TROPONINI in the last 168 hours. BNP: Invalid input(s): POCBNP CBG: No results for input(s): GLUCAP in the last 168 hours.  Time coordinating discharge: 50 minutes  Signed:  Nolon NationsLachina Moore Kelissa Merlin  APRN, MSN, FNP-C [facility] [address]  [place], [place] [phone]  [facility] 10/08/2019, 11:46 AM

## 2019-10-08 NOTE — Progress Notes (Addendum)
Pt discharged home with all belongings. Pt discharge education completed, all discharge medications reviewed. No questions or concerns at this time. Work excuse note written by physician, sent with pt at time of discharge.

## 2019-10-08 NOTE — Discharge Instructions (Signed)
Sickle Cell Anemia, Adult ° °Sickle cell anemia is a condition in which red blood cells have an abnormal “sickle” shape. Red blood cells carry oxygen through the body. Sickle-shaped red blood cells do not live as long as normal red blood cells. They also clump together and block blood from flowing through the blood vessels. This condition prevents the body from getting enough oxygen. Sickle cell anemia causes organ damage and pain. It also increases the risk of infection. °What are the causes? °This condition is caused by a gene that is passed from parent to child (inherited). Receiving two copies of the gene causes the disease. Receiving one copy causes the "trait," which means that symptoms are milder or not present. °What increases the risk? °This condition is more likely to develop if your ancestors were from [place], the Mediterranean, South or [place], the [place], [place], or the [place]. °What are the signs or symptoms? °Symptoms of this condition include: °· Episodes of pain (crises), especially in the hands and feet, joints, back, chest, or abdomen. The pain can be triggered by: °? An illness, especially if there is dehydration. °? Doing an activity with great effort (overexertion). °? Exposure to extreme temperature changes. °? High altitude. °· Fatigue. °· Shortness of breath or difficulty breathing. °· Dizziness. °· Pale skin or yellowed skin (jaundice). °· Frequent bacterial infections. °· Pain and swelling in the hands and feet (hand-food syndrome). °· Prolonged, painful erection of the penis (priapism). °· Acute chest syndrome. Symptoms of this include: °? Chest pain. °? Fever. °? Cough. °? Fast breathing. °· Stroke. °· Decreased activity. °· Loss of appetite. °· Change in behavior. °· Headaches. °· Seizures. °· Vision changes. °· Skin ulcers. °· Heart disease. °· High blood pressure. °· Gallstones. °· Liver and kidney problems. °How is this diagnosed? °This condition is diagnosed with  blood tests that check for the gene that causes this condition. °How is this treated? °There is no cure for most cases of this condition. Treatment focuses on managing your symptoms and preventing complications of the disease. Your health care provider will work with you to identify the best treatment options for you based on an assessment of your condition. Treatment may include: °· Medicines, including: °? Pain medicines. °? Antibiotic medicines for infection. °? Medicines to increase the production of a protein in red blood cells that helps carry oxygen in the body (hemoglobin). °· Fluids to treat pain and swelling. °· Oxygen to treat acute chest syndrome. °· Blood transfusions to treat symptoms such as fatigue, stroke, and acute chest syndrome. °· Massage and physical therapy for pain. °· Regular tests to monitor your condition, such as blood tests, X-rays, CT scans, MRI scans, ultrasounds, and lung function tests. These should be done every 3-12 months, depending on your age. °· Hematopoietic stem cell transplant. This is a procedure to replace abnormal stem cells with healthy stem cells from a donor's bone marrow. Stem cells are cells that can develop into blood cells, and bone marrow is the spongy tissue inside the bones. °Follow these instructions at home: °Medicines °· Take over-the-counter and prescription medicines only as told by your health care provider. °· If you were prescribed an antibiotic medicine, take it as told by your health care provider. Do not stop taking the antibiotic even if you start to feel better. °· If you develop a fever, do not take medicines to reduce the fever right away. This could cover up another problem. Notify your health care provider. °Managing   pain, stiffness, and swelling °· Try these methods to help ease your pain: °? Using a heating pad. °? Taking a warm bath. °? Distracting yourself, such as by watching TV. °Eating and drinking °· Drink enough fluid to keep your urine  clear or pale yellow. Drink more in hot weather and during exercise. °· Limit or avoid drinking alcohol. °· Eat a balanced and nutritious diet. Eat plenty of fruits, vegetables, whole grains, and lean protein. °· Take vitamins and supplements as directed by your health care provider. °Traveling °· When traveling, keep these with you: °? Your medical information. °? The names of your health care providers. °? Your medicines. °· If you have to travel by air, ask about precautions you should take. °Activity °· Get plenty of rest. °· Avoid activities that will lower your oxygen levels, such as exercising vigorously. °General instructions °· Do not use any products that contain nicotine or tobacco, such as cigarettes and e-cigarettes. They lower blood oxygen levels. If you need help quitting, ask your health care provider. °· Consider wearing a medical alert bracelet. °· Avoid high altitudes. °· Avoid extreme temperatures and extreme temperature changes. °· Keep all follow-up visits as told by your health care provider. This is important. °Contact a health care provider if: °· You develop joint pain. °· Your feet or hands swell or have pain. °· You have fatigue. °Get help right away if: °· You have symptoms of infection. These include: °? Fever. °? Chills. °? Extreme tiredness. °? Irritability. °? Poor eating. °? Vomiting. °· You feel dizzy or faint. °· You have new abdominal pain, especially on the left side near the stomach area. °· You develop priapism. °· You have numbness in your arms or legs or have trouble moving them. °· You have trouble talking. °· You develop pain that cannot be controlled with medicine. °· You become short of breath. °· You have rapid breathing. °· You have a persistent cough. °· You have pain in your chest. °· You develop a severe headache or stiff neck. °· You feel bloated without eating or after eating a small amount of food. °· Your skin is pale. °· You suddenly lose  vision. °Summary °· Sickle cell anemia is a condition in which red blood cells have an abnormal “sickle” shape. This disease can cause organ damage and chronic pain, and it can raise your risk of infection. °· Sickle cell anemia is a genetic disorder. °· Treatment focuses on managing your symptoms and preventing complications of the disease. °· Get medical help right away if you have any signs of infection, such as a fever. °This information is not intended to replace advice given to you by your health care provider. Make sure you discuss any questions you have with your health care provider. °Document Released: 02/28/2006 Document Revised: 03/14/2019 Document Reviewed: 12/26/2016 °Elsevier Patient Education © 2020 [employer] ° °

## 2019-10-16 ENCOUNTER — Other Ambulatory Visit: Payer: Self-pay | Admitting: Family Medicine

## 2019-10-16 ENCOUNTER — Telehealth: Payer: Self-pay

## 2019-10-16 DIAGNOSIS — D5700 Hb-SS disease with crisis, unspecified: Secondary | ICD-10-CM

## 2019-10-16 MED ORDER — OXYCODONE HCL 5 MG PO TABS: 5.0000 mg | ORAL_TABLET | ORAL | 0 refills | Status: DC | PRN

## 2019-10-16 NOTE — Progress Notes (Signed)
Meds ordered this encounter  Medications  . oxyCODONE (ROXICODONE) 5 MG immediate release tablet    Sig: Take 1 tablet (5 mg total) by mouth every 4 (four) hours as needed for severe pain.    Dispense:  20 tablet    Refill:  0    Order Specific Question:   Supervising Provider    Answer:   Tresa Garter [[id number]]    Donia Pounds  APRN, MSN, FNP-C [facility] [address] [place], [place] [address] [phone]

## 2019-10-16 NOTE — Telephone Encounter (Signed)
Patient called today and states when she was in the hospital the rx that was sent in when to the incorrect pharmacy and she is asking that it be re-sent to [place]. Please advise.

## 2019-10-24 ENCOUNTER — Ambulatory Visit: Payer: Medicaid Other | Admitting: Family Medicine

## 2020-05-05 ENCOUNTER — Ambulatory Visit (HOSPITAL_COMMUNITY)
Admission: EM | Admit: 2020-05-05 | Discharge: 2020-05-05 | Disposition: A | Discharge status: Home / Self Care | Payer: Medicaid Other | Attending: Family Medicine | Admitting: Family Medicine

## 2020-05-05 ENCOUNTER — Encounter (HOSPITAL_COMMUNITY): Payer: Self-pay

## 2020-05-05 DIAGNOSIS — Z3201 Encounter for pregnancy test, result positive: Secondary | ICD-10-CM

## 2020-05-05 LAB — POCT URINALYSIS DIP (DEVICE)
Bilirubin Urine: NEGATIVE
Glucose, UA: NEGATIVE mg/dL
Hgb urine dipstick: NEGATIVE
Ketones, ur: NEGATIVE mg/dL
Leukocytes,Ua: NEGATIVE
Nitrite: NEGATIVE
Protein, ur: NEGATIVE mg/dL
Specific Gravity, Urine: 1.025 (ref 1.005–1.030)
Urobilinogen, UA: 0.2 mg/dL (ref 0.0–1.0)
pH: 5.5 (ref 5.0–8.0)

## 2020-05-05 LAB — POC URINE PREG, ED: Preg Test, Ur: POSITIVE — AB

## 2020-05-05 NOTE — ED Provider Notes (Signed)
[place]    CSN: [id number] Arrival date & time: 05/05/20  1219      History   Chief Complaint Chief Complaint  Patient presents with  . Abdominal Pain    HPI Nicole Case is a 19 y.o. female.   Patient is a 19 year old female presents today with proximally 4 days of generalized lower abdominal discomfort, diarrhea.  Reporting the stools are soft and not watery.  No blood in stool.  Mild nausea but no vomiting.  No fever, chills, body aches, night sweats, dysuria, hematuria, urinary frequency.  No vaginal symptoms.  Describes the pain as stabbing.  She has not take anything for the symptoms.  Reporting she is approximately 1 week late on her menstrual cycle.  Is not currently on any birth control and is currently sexually active.  ROS per HPI      Past Medical History:  Diagnosis Date  . Sickle cell anemia (HCC)   . Sickle cell anemia [facility])     Patient Active Problem List   Diagnosis Date Noted  . Sore throat 10/06/2019  . Fever 10/06/2019  . Urinary tract infection symptoms 10/06/2019  . Sickle-cell disease with pain ([place]) 08/01/2017  . Sickle cell disease, type [place], with unspecified crisis ([place]) 07/31/2017  . Sickle cell anemia with pain ([place]) 05/28/2016  . 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 Sickle cell pain crisis ([place]) 01/29/2014  . Sickle cell pain crisis 12/12/2011    Past Surgical History:  Procedure Laterality Date  . NO PAST SURGERIES      OB History   No obstetric history on file.      Home Medications    Prior to Admission medications   Medication Sig Start Date End Date Taking? Authorizing Provider  acetaminophen (TYLENOL) 325 MG tablet Take 2 tablets (650 mg total) by mouth every 6 (six) hours as needed for mild pain, moderate pain, fever or headache. 10/08/19   [name], FNP  folic acid (FOLVITE) 1 MG tablet Take 1 tablet (1 mg total) by mouth daily. 10/08/19 10/07/20  [name], FNP  ibuprofen (ADVIL) 800 MG  tablet Take 1 tablet (800 mg total) by mouth every 8 (eight) hours as needed. 10/08/19   [name], FNP  oxyCODONE (ROXICODONE) 5 MG immediate release tablet Take 1 tablet (5 mg total) by mouth every 4 (four) hours as needed for severe pain. 10/16/19   [name], FNP    Family History Family History  Problem Relation Age of Onset  . Sickle cell anemia Brother     Social History Social History   Tobacco Use  . Smoking status: Never Smoker  . Smokeless tobacco: Never Used  Substance Use Topics  . Alcohol use: No  . Drug use: No     Allergies   Patient has no known allergies.   Review of Systems Review of Systems   Physical Exam Triage Vital Signs ED Triage Vitals  Enc Vitals Group     BP 05/05/20 1240 107/73     Pulse Rate 05/05/20 1240 96     Resp 05/05/20 1240 16     Temp 05/05/20 1240 98.4 F (36.9 C)     Temp Source 05/05/20 1240 Oral     SpO2 05/05/20 1240 99 %     Weight 05/05/20 1241 215 lb (97.5 kg)     Height 05/05/20 1241 5\' 7"  (1.702 m)     Head Circumference --      Peak  Flow --      Pain Score 05/05/20 1241 6     Pain Loc --      Pain Edu? --      Excl. in GC? --    No data found.  Updated Vital Signs BP 107/73   Pulse 96   Temp 98.4 F (36.9 C) (Oral)   Resp 16   Ht 5\' 7"  (1.702 m)   Wt 215 lb (97.5 kg)   SpO2 99%   BMI 33.67 kg/m   Visual Acuity Right Eye Distance:   Left Eye Distance:   Bilateral Distance:    Right Eye Near:   Left Eye Near:    Bilateral Near:     Physical Exam Vitals and nursing note reviewed.  Constitutional:      General: She is not in acute distress.    Appearance: Normal appearance. She is well-developed. She is not ill-appearing, toxic-appearing or diaphoretic.  HENT:     Head: Normocephalic.     Nose: Nose normal.     Mouth/Throat:     Pharynx: Oropharynx is clear.  Eyes:     Conjunctiva/sclera: Conjunctivae normal.  Pulmonary:     Effort: Pulmonary effort is normal.  Abdominal:      Palpations: Abdomen is soft.     Tenderness: There is abdominal tenderness in the right lower quadrant and left lower quadrant. There is no right CVA tenderness, left CVA tenderness, guarding or rebound. Negative signs include Rovsing's sign.  Musculoskeletal:        General: Normal range of motion.     Cervical back: Normal range of motion.  Skin:    General: Skin is warm and dry.     Findings: No rash.  Neurological:     Mental Status: She is alert.  Psychiatric:        Mood and Affect: Mood normal.      UC Treatments / Results  Labs (all labs ordered are listed, but only abnormal results are displayed) Labs Reviewed  POC URINE PREG, ED - Abnormal; Notable for the following components:      Result Value   Preg Test, Ur POSITIVE (*)    All other components within normal limits  POCT URINALYSIS DIP (DEVICE)    EKG   Radiology No results found.  Procedures Procedures (including critical care time)  Medications Ordered in UC Medications - No data to display  Initial Impression / Assessment and Plan / UC Course  I have reviewed the triage vital signs and the nursing notes.  Pertinent labs & imaging results that were available during my care of the patient were reviewed by me and considered in my medical decision making (see chart for details).     Lower abdominal pain and diarrhea No acute abdomen on exam.  Pregnancy test positive.  This could be the cause of some of her symptoms to include the nausea and abdominal cramping. Recommended if pain worsens she will need to go to the ER.  No concern for ectopic pregnancy at this time. Contact given for OB/GYN for follow-up for prenatal care Final Clinical Impressions(s) / UC Diagnoses   Final diagnoses:  Positive pregnancy test     Discharge Instructions     Your pregnancy test is positive.  Would like for you to follow-up with OB/GYN Your urine did not show any infection Not terribly concerned right now for  the abdominal pain you are having.  Do not believe it is any of the concerning things  we talked about Make sure you are drinking plenty of fluids and staying hydrated If your pain worsens you will need to go to the hospital for ultrasound Otherwise I will give you contact for OB/GYN    ED Prescriptions    None     PDMP not reviewed this encounter.   Dahlia Byes A, NP 05/05/20 1408

## 2020-05-05 NOTE — Discharge Instructions (Addendum)
Your pregnancy test is positive.  Would like for you to follow-up with OB/GYN Your urine did not show any infection Not terribly concerned right now for the abdominal pain you are having.  Do not believe it is any of the concerning things we talked about Make sure you are drinking plenty of fluids and staying hydrated If your pain worsens you will need to go to the hospital for ultrasound Otherwise I will give you contact for OB/GYN

## 2020-05-05 NOTE — ED Triage Notes (Signed)
Pt c/o 6/10 stabbing pain in RLQ, LLQ of abdomenx4 days. Pt states had 10 bouts of diarrhea in the past 4 days. Pt denies N/V.

## 2020-05-07 ENCOUNTER — Encounter (HOSPITAL_COMMUNITY): Payer: Self-pay | Admitting: Obstetrics and Gynecology

## 2020-05-07 ENCOUNTER — Inpatient Hospital Stay (HOSPITAL_COMMUNITY)
Admission: AD | Admit: 2020-05-07 | Discharge: 2020-05-07 | Disposition: A | Discharge status: Home / Self Care | Payer: Medicaid Other | Attending: Obstetrics and Gynecology | Admitting: Obstetrics and Gynecology

## 2020-05-07 ENCOUNTER — Inpatient Hospital Stay (HOSPITAL_COMMUNITY): Payer: Medicaid Other

## 2020-05-07 ENCOUNTER — Other Ambulatory Visit: Payer: Self-pay

## 2020-05-07 DIAGNOSIS — R1033 Periumbilical pain: Secondary | ICD-10-CM | POA: Diagnosis not present

## 2020-05-07 DIAGNOSIS — O99011 Anemia complicating pregnancy, first trimester: Secondary | ICD-10-CM | POA: Diagnosis not present

## 2020-05-07 DIAGNOSIS — R109 Unspecified abdominal pain: Secondary | ICD-10-CM | POA: Diagnosis not present

## 2020-05-07 DIAGNOSIS — Z3A01 Less than 8 weeks gestation of pregnancy: Secondary | ICD-10-CM | POA: Insufficient documentation

## 2020-05-07 DIAGNOSIS — O26891 Other specified pregnancy related conditions, first trimester: Secondary | ICD-10-CM | POA: Diagnosis not present

## 2020-05-07 DIAGNOSIS — O3680X0 Pregnancy with inconclusive fetal viability, not applicable or unspecified: Secondary | ICD-10-CM | POA: Diagnosis not present

## 2020-05-07 DIAGNOSIS — Z3A00 Weeks of gestation of pregnancy not specified: Secondary | ICD-10-CM | POA: Diagnosis not present

## 2020-05-07 DIAGNOSIS — D571 Sickle-cell disease without crisis: Secondary | ICD-10-CM | POA: Diagnosis not present

## 2020-05-07 DIAGNOSIS — Z832 Family history of diseases of the blood and blood-forming organs and certain disorders involving the immune mechanism: Secondary | ICD-10-CM | POA: Insufficient documentation

## 2020-05-07 LAB — CBC
HCT: 29.1 % — ABNORMAL LOW (ref 36.0–46.0)
Hemoglobin: 10.2 g/dL — ABNORMAL LOW (ref 12.0–15.0)
MCH: 24.2 pg — ABNORMAL LOW (ref 26.0–34.0)
MCHC: 35.1 g/dL (ref 30.0–36.0)
MCV: 69 fL — ABNORMAL LOW (ref 80.0–100.0)
Platelets: 163 10*3/uL (ref 150–400)
RBC: 4.22 MIL/uL (ref 3.87–5.11)
RDW: 20.6 % — ABNORMAL HIGH (ref 11.5–15.5)
WBC: 8.3 10*3/uL (ref 4.0–10.5)
nRBC: 0 % (ref 0.0–0.2)

## 2020-05-07 LAB — URINALYSIS, ROUTINE W REFLEX MICROSCOPIC
Bilirubin Urine: NEGATIVE
Glucose, UA: NEGATIVE mg/dL
Hgb urine dipstick: NEGATIVE
Ketones, ur: 5 mg/dL — AB
Nitrite: NEGATIVE
Protein, ur: NEGATIVE mg/dL
Specific Gravity, Urine: 1.012 (ref 1.005–1.030)
pH: 5 (ref 5.0–8.0)

## 2020-05-07 LAB — WET PREP, GENITAL
Clue Cells Wet Prep HPF POC: NONE SEEN
Sperm: NONE SEEN
Trich, Wet Prep: NONE SEEN
Yeast Wet Prep HPF POC: NONE SEEN

## 2020-05-07 LAB — COMPREHENSIVE METABOLIC PANEL
ALT: 16 U/L (ref 0–44)
AST: 17 U/L (ref 15–41)
Albumin: 3.7 g/dL (ref 3.5–5.0)
Alkaline Phosphatase: 53 U/L (ref 38–126)
Anion gap: 13 (ref 5–15)
BUN: 8 mg/dL (ref 6–20)
CO2: 22 mmol/L (ref 22–32)
Calcium: 8.6 mg/dL — ABNORMAL LOW (ref 8.9–10.3)
Chloride: 102 mmol/L (ref 98–111)
Creatinine, Ser: 0.54 mg/dL (ref 0.44–1.00)
GFR calc Af Amer: >60 mL/min (ref >60)
GFR calc non Af Amer: >60 mL/min (ref >60)
Glucose, Bld: 90 mg/dL (ref 70–99)
Potassium: 3.6 mmol/L (ref 3.5–5.1)
Sodium: 137 mmol/L (ref 135–145)
Total Bilirubin: 1.4 mg/dL — ABNORMAL HIGH (ref 0.3–1.2)
Total Protein: 7.2 g/dL (ref 6.5–8.1)

## 2020-05-07 LAB — HCG, QUANTITATIVE, PREGNANCY: hCG, Beta Chain, Quant, S: 836 m[IU]/mL — ABNORMAL HIGH (ref <5)

## 2020-05-07 LAB — ABO/RH: ABO/RH(D): A POS

## 2020-05-07 MED ORDER — ACETAMINOPHEN 500 MG PO TABS
1000.0000 mg | ORAL_TABLET | Freq: Once | ORAL | Status: AC
  Administered 2020-05-07: 1000 mg via ORAL
  Filled 2020-05-07: qty 2

## 2020-05-07 NOTE — MAU Provider Note (Signed)
History     CSN: [id number]  Arrival date and time: 05/07/20 1738   First Provider Initiated Contact with Patient 05/07/20 2118      Chief Complaint  Patient presents with  . Abdominal Pain   HPI Nicole Case is a 19 y.o. G1P0 at [redacted]w[redacted]d by uncertain LMP who presents to MAU with chief complaint of abdominal pain in early pregnancy. This is a new problem, onset 3-4 days ago. Patient states pain is periumbilical and "stabbing and shooting" into her vagina. She also endorses 2 week history of diarrhea. She denies vaginal bleeding, abdominal tenderness, abnormal vaginal discharge, dysuria, fever or recent illness.  She has previously received care with [facility] and has scheduled a pregnancy confirmation visit there.   OB History    Gravida  1   Para      Term      Preterm      AB      Living        SAB      TAB      Ectopic      Multiple      Live Births              Past Medical History:  Diagnosis Date  . Sickle cell anemia (HCC)   . Sickle cell anemia (HCC)     Past Surgical History:  Procedure Laterality Date  . NO PAST SURGERIES      Family History  Problem Relation Age of Onset  . Sickle cell anemia Brother     Social History   Tobacco Use  . Smoking status: Never Smoker  . Smokeless tobacco: Never Used  Substance Use Topics  . Alcohol use: No  . Drug use: Not Currently    Allergies: No Known Allergies  No medications prior to admission.    Review of Systems  Gastrointestinal: Positive for abdominal pain.  Genitourinary: Negative for dysuria and vaginal bleeding.  Musculoskeletal: Negative for back pain.  All other systems reviewed and are negative.  Physical Exam   Blood pressure 123/71, pulse 90, temperature 98.9 F (37.2 C), temperature source Oral, resp. rate 16, height 5\' 7"  (1.702 m), weight 117.6 kg, last menstrual period 03/28/2020, SpO2 100 %.  Physical Exam  Nursing note and vitals reviewed. Constitutional: She  is oriented to person, place, and time. She appears well-developed and well-nourished.  Cardiovascular: Normal rate and normal heart sounds.  Respiratory: Effort normal and breath sounds normal.  GI: Soft. She exhibits no distension. There is no abdominal tenderness. There is no rebound and no guarding.  Genitourinary:    No vaginal discharge.   Neurological: She is alert and oriented to person, place, and time.  Skin: Skin is warm and dry.  Psychiatric: She has a normal mood and affect. Her behavior is normal. Judgment and thought content normal.    MAU Course/MDM  Procedures  Patient Vitals for the past 24 hrs:  BP Temp Temp src Pulse Resp SpO2 Height Weight  05/07/20 2127 123/71 -- -- 90 16 100 % -- --  05/07/20 2018 118/67 98.9 F (37.2 C) Oral 94 20 -- 5\' 7"  (1.702 m) 117.6 kg  05/07/20 1816 120/64 98.3 F (36.8 C) Oral 93 20 100 % 5\' 7"  (1.702 m) 97.5 kg   Results for orders placed or performed during the hospital encounter of 05/07/20 (from the past 24 hour(s))  CBC     Status: Abnormal   Collection Time: 05/07/20  8:10 PM  Result Value Ref Range   WBC 8.3 4.0 - 10.5 K/uL   RBC 4.22 3.87 - 5.11 MIL/uL   Hemoglobin 10.2 (L) 12.0 - 15.0 g/dL   HCT 29.1 (L) 36.0 - 46.0 %   MCV 69.0 (L) 80.0 - 100.0 fL   MCH 24.2 (L) 26.0 - 34.0 pg   MCHC 35.1 30.0 - 36.0 g/dL   RDW 20.6 (H) 11.5 - 15.5 %   Platelets 163 150 - 400 K/uL   nRBC 0.0 0.0 - 0.2 %  hCG, quantitative, pregnancy     Status: Abnormal   Collection Time: 05/07/20  8:10 PM  Result Value Ref Range   hCG, Beta Chain, Quant, S 836 (H) <5 mIU/mL  ABO/Rh     Status: None   Collection Time: 05/07/20  8:10 PM  Result Value Ref Range   ABO/RH(D) A POS    No rh immune globuloin      NOT A RH IMMUNE GLOBULIN CANDIDATE, PT RH POSITIVE Performed at [facility], [place] [address], [place], [place] [address]   Comprehensive metabolic panel     Status: Abnormal   Collection Time: 05/07/20  8:10 PM  Result Value Ref  Range   Sodium 137 135 - 145 mmol/L   Potassium 3.6 3.5 - 5.1 mmol/L   Chloride 102 98 - 111 mmol/L   CO2 22 22 - 32 mmol/L   Glucose, Bld 90 70 - 99 mg/dL   BUN 8 6 - 20 mg/dL   Creatinine, Ser 0.54 0.44 - 1.00 mg/dL   Calcium 8.6 (L) 8.9 - 10.3 mg/dL   Total Protein 7.2 6.5 - 8.1 g/dL   Albumin 3.7 3.5 - 5.0 g/dL   AST 17 15 - 41 U/L   ALT 16 0 - 44 U/L   Alkaline Phosphatase 53 38 - 126 U/L   Total Bilirubin 1.4 (H) 0.3 - 1.2 mg/dL   GFR calc non Af Amer >60 >60 mL/min   GFR calc Af Amer >60 >60 mL/min   Anion gap 13 5 - 15  Wet prep, genital     Status: Abnormal   Collection Time: 05/07/20  8:24 PM   Specimen: PATH Cytology Cervicovaginal Ancillary Only  Result Value Ref Range   Yeast Wet Prep HPF POC NONE SEEN NONE SEEN   Trich, Wet Prep NONE SEEN NONE SEEN   Clue Cells Wet Prep HPF POC NONE SEEN NONE SEEN   WBC, Wet Prep HPF POC FEW (A) NONE SEEN   Sperm NONE SEEN   Urinalysis, Routine w reflex microscopic     Status: Abnormal   Collection Time: 05/07/20  8:27 PM  Result Value Ref Range   Color, Urine YELLOW YELLOW   APPearance HAZY (A) CLEAR   Specific Gravity, Urine 1.012 1.005 - 1.030   pH 5.0 5.0 - 8.0   Glucose, UA NEGATIVE NEGATIVE mg/dL   Hgb urine dipstick NEGATIVE NEGATIVE   Bilirubin Urine NEGATIVE NEGATIVE   Ketones, ur 5 (A) NEGATIVE mg/dL   Protein, ur NEGATIVE NEGATIVE mg/dL   Nitrite NEGATIVE NEGATIVE   Leukocytes,Ua SMALL (A) NEGATIVE   RBC / HPF 0-5 0 - 5 RBC/hpf   WBC, UA 11-20 0 - 5 WBC/hpf   Bacteria, UA FEW (A) NONE SEEN   Squamous Epithelial / LPF 6-10 0 - 5   Mucus PRESENT    Hyaline Casts, UA PRESENT    Assessment and Plan  --19 y.o. G1P0 with pregnancy of unknown location --Quant hCG 836 --Discharge  home in stable condition with bleeding, ectopic precautions  F/U: --Appt made for stat Quant hCG Monday morning at [facility]  Calvert Cantor, CNM 05/07/2020, 9:57 PM

## 2020-05-07 NOTE — Discharge Instructions (Signed)
Abdominal Pain During Pregnancy  Belly (abdominal) pain is common during pregnancy. There are many possible causes. Most of the time, it is not a serious problem. Other times, it can be a sign that something is wrong with the pregnancy. Always tell your doctor if you have belly pain. Follow these instructions at home:  Do not have sex or put anything in your vagina until your pain goes away completely.  Get plenty of rest until your pain gets better.  Drink enough fluid to keep your pee (urine) pale yellow.  Take over-the-counter and prescription medicines only as told by your doctor.  Keep all follow-up visits as told by your doctor. This is important. Contact a doctor if:  Your pain continues or gets worse after resting.  You have lower belly pain that: ? Comes and goes at regular times. ? Spreads to your back. ? Feels like menstrual cramps.  You have pain or burning when you pee (urinate). Get help right away if:  You have a fever or chills.  You have vaginal bleeding.  You are leaking fluid from your vagina.  You are passing tissue from your vagina.  You throw up (vomit) for more than 24 hours.  You have watery poop (diarrhea) for more than 24 hours.  Your baby is moving less than usual.  You feel very weak or faint.  You have shortness of breath.  You have very bad pain in your upper belly. Summary  Belly (abdominal) pain is common during pregnancy. There are many possible causes.  If you have belly pain during pregnancy, tell your doctor right away.  Keep all follow-up visits as told by your doctor. This is important. This information is not intended to replace advice given to you by your health care provider. Make sure you discuss any questions you have with your health care provider. Document Revised: 03/10/2019 Document Reviewed: 02/22/2017 Elsevier Patient Education  2020 Elsevier Inc.  

## 2020-05-07 NOTE — MAU Note (Signed)
PT SAYS SHE WENT TO URGENT CARE ON WED - THEN [place] TONIGHT -  WAS SENT HERE. HAS PAIN IN LOWER ABD PAIN -  Tuesday . ALSO  VAG PAIN ON Tuesday .

## 2020-05-07 NOTE — ED Provider Notes (Signed)
MSE was initiated and I personally evaluated the patient and placed orders (if any) at  7:18 PM on May 07, 2020.  The patient appears stable so that the remainder of the MSE may be completed by another provider.  G1P0. Pt presenting with few days of lower abd pain, described as sharp stabbing pains that come and go. Located [place] lower abdomen. No vaginal bleeding, or abnormal discharge. No fever. LMP beginning May.   HD stable. Abd soft, mild TTP to lower abdomen.  MAU provider, Sam, accepting transfer.   Nicole Case, [place] N, PA-C 05/07/20 1930    Pollyann Savoy, MD 05/08/20 [id number]

## 2020-05-10 ENCOUNTER — Other Ambulatory Visit: Payer: Self-pay

## 2020-05-10 ENCOUNTER — Ambulatory Visit (INDEPENDENT_AMBULATORY_CARE_PROVIDER_SITE_OTHER): Payer: Medicaid Other

## 2020-05-10 ENCOUNTER — Telehealth: Payer: Self-pay | Admitting: Medical

## 2020-05-10 VITALS — BP 119/78 | HR 86

## 2020-05-10 DIAGNOSIS — A749 Chlamydial infection, unspecified: Secondary | ICD-10-CM

## 2020-05-10 DIAGNOSIS — O3680X0 Pregnancy with inconclusive fetal viability, not applicable or unspecified: Secondary | ICD-10-CM

## 2020-05-10 LAB — GC/CHLAMYDIA PROBE AMP (~~LOC~~) NOT AT ARMC
Chlamydia: POSITIVE — AB
Comment: NEGATIVE
Comment: NORMAL
Neisseria Gonorrhea: NEGATIVE

## 2020-05-10 LAB — BETA HCG QUANT (REF LAB): hCG Quant: 2057 m[IU]/mL

## 2020-05-10 MED ORDER — AZITHROMYCIN 250 MG PO TABS: 1000.0000 mg | ORAL_TABLET | Freq: Once | ORAL | 0 refills | Status: AC

## 2020-05-10 NOTE — Progress Notes (Signed)
Nicole Case presented to the office today for a follow-up stat/nurse lab from 05/07/2020. Patient reports no pain or vaginal bleeding at this time. Patient walk to the lab for STAT and lab ordered placed in epic. Phone number was verified and patient informed will we call her in about 2-3 hours with test results and follow up care. Ectopic precaution discuss and patient verbalizes understanding at this time.   05/07/2020  HCG: 836  05/10/2020 2057  Provider has been informed of HCG results. Call patient to inform her of test results and she will need to follow up in 10 days to scheduled an u/s. Ectopic precaution have been discussed.  Ultrasound appointment has been schedule for 05/20/2020@ 10:00. Patient contacted regarding her results and u/s appointment.

## 2020-05-10 NOTE — Telephone Encounter (Addendum)
Nicole Case tested positive for  Chlamydia. Patient was called by RN and allergies and pharmacy confirmed. Rx sent to pharmacy of choice.   Nicole Case 05/10/2020 4:49 PM       ----- Message from Nicole Becton, RN sent at 05/10/2020  4:33 PM EDT ----- This patient tested positive for :  Chlamydia  She "has NKDA",I have informed the patient of her results and confirmed her pharmacy is correct in her chart. Please send Rx.   Thank you,   Nicole Becton, RN   Results faxed to [facility] Department.

## 2020-05-12 NOTE — Progress Notes (Signed)
Agree with A & P. 

## 2020-05-18 ENCOUNTER — Ambulatory Visit: Payer: Medicaid Other

## 2020-05-20 ENCOUNTER — Other Ambulatory Visit: Payer: Self-pay

## 2020-05-20 ENCOUNTER — Ambulatory Visit
Admission: RE | Admit: 2020-05-20 | Discharge: 2020-05-20 | Disposition: A | Discharge status: Home / Self Care | Payer: Medicaid Other | Source: Ambulatory Visit | Attending: Obstetrics and Gynecology | Admitting: Obstetrics and Gynecology

## 2020-05-20 DIAGNOSIS — O3680X0 Pregnancy with inconclusive fetal viability, not applicable or unspecified: Secondary | ICD-10-CM

## 2020-05-20 DIAGNOSIS — Z3A01 Less than 8 weeks gestation of pregnancy: Secondary | ICD-10-CM | POA: Diagnosis not present

## 2020-05-20 DIAGNOSIS — Z3491 Encounter for supervision of normal pregnancy, unspecified, first trimester: Secondary | ICD-10-CM | POA: Diagnosis not present

## 2020-05-24 ENCOUNTER — Telehealth: Payer: Self-pay

## 2020-05-24 NOTE — Telephone Encounter (Signed)
Entered in error

## 2020-06-20 ENCOUNTER — Encounter (HOSPITAL_COMMUNITY): Payer: Self-pay | Admitting: Emergency Medicine

## 2020-06-20 ENCOUNTER — Emergency Department (HOSPITAL_COMMUNITY): Payer: Medicaid Other

## 2020-06-20 ENCOUNTER — Other Ambulatory Visit: Payer: Self-pay

## 2020-06-20 ENCOUNTER — Inpatient Hospital Stay (HOSPITAL_COMMUNITY)
Admission: EM | Admit: 2020-06-20 | Discharge: 2020-06-22 | DRG: 831 | Disposition: A | Discharge status: Home / Self Care | Payer: Medicaid Other | Attending: Internal Medicine | Admitting: Internal Medicine

## 2020-06-20 DIAGNOSIS — O99011 Anemia complicating pregnancy, first trimester: Secondary | ICD-10-CM | POA: Diagnosis not present

## 2020-06-20 DIAGNOSIS — Z832 Family history of diseases of the blood and blood-forming organs and certain disorders involving the immune mechanism: Secondary | ICD-10-CM

## 2020-06-20 DIAGNOSIS — J9811 Atelectasis: Secondary | ICD-10-CM | POA: Diagnosis not present

## 2020-06-20 DIAGNOSIS — Z20822 Contact with and (suspected) exposure to covid-19: Secondary | ICD-10-CM | POA: Diagnosis present

## 2020-06-20 DIAGNOSIS — Z87440 Personal history of urinary (tract) infections: Secondary | ICD-10-CM | POA: Diagnosis not present

## 2020-06-20 DIAGNOSIS — O26891 Other specified pregnancy related conditions, first trimester: Secondary | ICD-10-CM | POA: Diagnosis present

## 2020-06-20 DIAGNOSIS — M549 Dorsalgia, unspecified: Secondary | ICD-10-CM | POA: Diagnosis not present

## 2020-06-20 DIAGNOSIS — R918 Other nonspecific abnormal finding of lung field: Secondary | ICD-10-CM | POA: Diagnosis not present

## 2020-06-20 DIAGNOSIS — J189 Pneumonia, unspecified organism: Secondary | ICD-10-CM | POA: Diagnosis not present

## 2020-06-20 DIAGNOSIS — R0789 Other chest pain: Secondary | ICD-10-CM | POA: Diagnosis not present

## 2020-06-20 DIAGNOSIS — O99511 Diseases of the respiratory system complicating pregnancy, first trimester: Secondary | ICD-10-CM | POA: Diagnosis present

## 2020-06-20 DIAGNOSIS — D5700 Hb-SS disease with crisis, unspecified: Secondary | ICD-10-CM | POA: Diagnosis not present

## 2020-06-20 DIAGNOSIS — R079 Chest pain, unspecified: Secondary | ICD-10-CM | POA: Diagnosis not present

## 2020-06-20 DIAGNOSIS — K449 Diaphragmatic hernia without obstruction or gangrene: Secondary | ICD-10-CM | POA: Diagnosis not present

## 2020-06-20 DIAGNOSIS — Z8701 Personal history of pneumonia (recurrent): Secondary | ICD-10-CM | POA: Diagnosis not present

## 2020-06-20 LAB — URINALYSIS, ROUTINE W REFLEX MICROSCOPIC
Bilirubin Urine: NEGATIVE
Glucose, UA: NEGATIVE mg/dL
Hgb urine dipstick: NEGATIVE
Ketones, ur: NEGATIVE mg/dL
Nitrite: NEGATIVE
Protein, ur: NEGATIVE mg/dL
Specific Gravity, Urine: 1.008 (ref 1.005–1.030)
pH: 5 (ref 5.0–8.0)

## 2020-06-20 LAB — BASIC METABOLIC PANEL
Anion gap: 8 (ref 5–15)
BUN: 10 mg/dL (ref 6–20)
CO2: 23 mmol/L (ref 22–32)
Calcium: 8.8 mg/dL — ABNORMAL LOW (ref 8.9–10.3)
Chloride: 104 mmol/L (ref 98–111)
Creatinine, Ser: 0.47 mg/dL (ref 0.44–1.00)
GFR calc Af Amer: >60 mL/min (ref >60)
GFR calc non Af Amer: >60 mL/min (ref >60)
Glucose, Bld: 125 mg/dL — ABNORMAL HIGH (ref 70–99)
Potassium: 3.6 mmol/L (ref 3.5–5.1)
Sodium: 135 mmol/L (ref 135–145)

## 2020-06-20 LAB — TROPONIN I (HIGH SENSITIVITY)
Troponin I (High Sensitivity): <2 ng/L (ref <18)
Troponin I (High Sensitivity): <2 ng/L (ref <18)

## 2020-06-20 LAB — CBC
HCT: 28.6 % — ABNORMAL LOW (ref 36.0–46.0)
Hemoglobin: 9.7 g/dL — ABNORMAL LOW (ref 12.0–15.0)
MCH: 24 pg — ABNORMAL LOW (ref 26.0–34.0)
MCHC: 33.9 g/dL (ref 30.0–36.0)
MCV: 70.6 fL — ABNORMAL LOW (ref 80.0–100.0)
Platelets: 151 10*3/uL (ref 150–400)
RBC: 4.05 MIL/uL (ref 3.87–5.11)
RDW: 21 % — ABNORMAL HIGH (ref 11.5–15.5)
WBC: 6.8 10*3/uL (ref 4.0–10.5)
nRBC: 0 % (ref 0.0–0.2)

## 2020-06-20 LAB — RETICULOCYTES
Immature Retic Fract: 27.4 % — ABNORMAL HIGH (ref 2.3–15.9)
RBC.: 4.03 MIL/uL (ref 3.87–5.11)
Retic Count, Absolute: 164 10*3/uL (ref 19.0–186.0)
Retic Ct Pct: 4.1 % — ABNORMAL HIGH (ref 0.4–3.1)

## 2020-06-20 LAB — I-STAT BETA HCG BLOOD, ED (NOT ORDERABLE): I-stat hCG, quantitative: >2000 m[IU]/mL — ABNORMAL HIGH (ref <5)

## 2020-06-20 LAB — SARS CORONAVIRUS 2 BY RT PCR (HOSPITAL ORDER, PERFORMED IN ~~LOC~~ HOSPITAL LAB): SARS Coronavirus 2: NEGATIVE

## 2020-06-20 LAB — D-DIMER, QUANTITATIVE: D-Dimer, Quant: 0.69 ug/mL-FEU — ABNORMAL HIGH (ref 0.00–0.50)

## 2020-06-20 MED ORDER — SODIUM CHLORIDE (PF) 0.9 % IJ SOLN
INTRAMUSCULAR | Status: AC
  Filled 2020-06-20: qty 50

## 2020-06-20 MED ORDER — PROMETHAZINE HCL 25 MG/ML IJ SOLN
12.5000 mg | Freq: Once | INTRAMUSCULAR | Status: AC
  Administered 2020-06-20: 12.5 mg via INTRAVENOUS
  Filled 2020-06-20: qty 1

## 2020-06-20 MED ORDER — MORPHINE SULFATE (PF) 4 MG/ML IV SOLN
8.0000 mg | INTRAVENOUS | Status: AC
  Administered 2020-06-20: 8 mg via INTRAVENOUS
  Filled 2020-06-20: qty 2

## 2020-06-20 MED ORDER — IOHEXOL 350 MG/ML SOLN
100.0000 mL | Freq: Once | INTRAVENOUS | Status: AC | PRN
  Administered 2020-06-20: 80 mL via INTRAVENOUS

## 2020-06-20 MED ORDER — MORPHINE SULFATE (PF) 4 MG/ML IV SOLN
6.0000 mg | INTRAVENOUS | Status: AC
  Administered 2020-06-20: 6 mg via INTRAVENOUS
  Filled 2020-06-20: qty 2

## 2020-06-20 MED ORDER — SODIUM CHLORIDE 0.9 % IV SOLN
1.0000 g | INTRAVENOUS | Status: DC
  Administered 2020-06-21: 1 g via INTRAVENOUS
  Filled 2020-06-20: qty 1

## 2020-06-20 MED ORDER — SODIUM CHLORIDE 0.9 % IV SOLN
1.0000 g | Freq: Once | INTRAVENOUS | Status: AC
  Administered 2020-06-20: 1 g via INTRAVENOUS
  Filled 2020-06-20: qty 10

## 2020-06-20 MED ORDER — PROMETHAZINE HCL 25 MG PO TABS
12.5000 mg | ORAL_TABLET | ORAL | Status: DC | PRN
  Administered 2020-06-20 – 2020-06-21 (×4): 12.5 mg via ORAL
  Filled 2020-06-20 (×4): qty 1

## 2020-06-20 MED ORDER — DEXTROSE-NACL 5-0.45 % IV SOLN: INTRAVENOUS | Status: DC

## 2020-06-20 MED ORDER — POLYETHYLENE GLYCOL 3350 17 G PO PACK: 17.0000 g | PACK | Freq: Every day | ORAL | Status: DC | PRN

## 2020-06-20 MED ORDER — SODIUM CHLORIDE 0.9% FLUSH: 3.0000 mL | Freq: Once | INTRAVENOUS | Status: DC

## 2020-06-20 MED ORDER — SODIUM CHLORIDE 0.45 % IV SOLN: INTRAVENOUS | Status: DC

## 2020-06-20 MED ORDER — SENNOSIDES-DOCUSATE SODIUM 8.6-50 MG PO TABS
1.0000 | ORAL_TABLET | Freq: Two times a day (BID) | ORAL | Status: DC
  Administered 2020-06-20 – 2020-06-21 (×3): 1 via ORAL
  Filled 2020-06-20 (×3): qty 1

## 2020-06-20 MED ORDER — DIPHENHYDRAMINE HCL 25 MG PO CAPS
25.0000 mg | ORAL_CAPSULE | ORAL | Status: DC | PRN
  Administered 2020-06-21: 50 mg via ORAL
  Administered 2020-06-21: 25 mg via ORAL
  Filled 2020-06-20: qty 2
  Filled 2020-06-20: qty 1

## 2020-06-20 MED ORDER — ENOXAPARIN SODIUM 40 MG/0.4ML ~~LOC~~ SOLN: 40.0000 mg | Freq: Every day | SUBCUTANEOUS | Status: DC

## 2020-06-20 MED ORDER — SODIUM CHLORIDE 0.9 % IV SOLN
500.0000 mg | Freq: Once | INTRAVENOUS | Status: AC
  Administered 2020-06-20: 500 mg via INTRAVENOUS
  Filled 2020-06-20: qty 500

## 2020-06-20 MED ORDER — HYDROMORPHONE HCL 1 MG/ML IJ SOLN
1.0000 mg | INTRAMUSCULAR | Status: DC | PRN
  Administered 2020-06-20: 1 mg via INTRAVENOUS
  Filled 2020-06-20: qty 1

## 2020-06-20 NOTE — ED Notes (Signed)
ED TO INPATIENT HANDOFF REPORT  ED Nurse Name and Phone #: [phone]  S Name/Age/Gender Nicole Case 19 y.o. female Room/Bed: WA15/WA15  Code Status   Code Status: Prior  Home/SNF/Other Home Patient oriented to: self, place, time and situation Is this baseline? Yes   Triage Complete: Triage complete  Chief Complaint Sickle cell anemia with crisis [facility]) [D57.00]  Triage Note Pt c/o chest pain, back pain, and bilateral arm pain that began yesterday. Pt has hx of sickle cell. Pt is currently ~2 months pregnant and is only prescribed tylenol for pain control at home.     Allergies No Known Allergies  Level of Care/Admitting Diagnosis ED Disposition    ED Disposition Condition Comment   Admit  Hospital Area: [facility] [[id number]]  Level of Care: Med-Surg [[id number]]  May admit patient to [facility] or [facility] if equivalent level of care is available:: No  Covid Evaluation: Asymptomatic Screening Protocol (No Symptoms)  Diagnosis: Sickle cell anemia with crisis [facility]) [[id number]]  Admitting Physician: Rometta Emery [[id number]]  Attending Physician: Rometta Emery [[id number]]  Estimated length of stay: past midnight tomorrow  Certification:: I certify this patient will need inpatient services for at least 2 midnights       B Medical/Surgery History Past Medical History:  Diagnosis Date  . Sickle cell anemia (HCC)   . Sickle cell anemia (HCC)    Past Surgical History:  Procedure Laterality Date  . NO PAST SURGERIES       A IV Location/Drains/Wounds Patient Lines/Drains/Airways Status    Active Line/Drains/Airways    Name Placement date Placement time Site Days   Peripheral IV 06/20/20 Right Antecubital 06/20/20  1651  Antecubital  less than 1   Peripheral IV 06/20/20 Left Antecubital 06/20/20  1925  Antecubital  less than 1          Intake/Output Last 24 hours  Intake/Output Summary (Last 24 hours) at 06/20/2020 2308 Last data filed at  06/20/2020 2252 Gross per 24 hour  Intake 691.67 ml  Output --  Net 691.67 ml    Labs/Imaging Results for orders placed or performed during the hospital encounter of 06/20/20 (from the past 48 hour(s))  Basic metabolic panel     Status: Abnormal   Collection Time: 06/20/20  3:42 PM  Result Value Ref Range   Sodium 135 135 - 145 mmol/L   Potassium 3.6 3.5 - 5.1 mmol/L   Chloride 104 98 - 111 mmol/L   CO2 23 22 - 32 mmol/L   Glucose, Bld 125 (H) 70 - 99 mg/dL    Comment: Glucose reference range applies only to samples taken after fasting for at least 8 hours.   BUN 10 6 - 20 mg/dL   Creatinine, Ser 3.89 0.44 - 1.00 mg/dL   Calcium 8.8 (L) 8.9 - 10.3 mg/dL   GFR calc non Af Amer >60 >60 mL/min   GFR calc Af Amer >60 >60 mL/min   Anion gap 8 5 - 15    Comment: Performed at [facility], [address] [place], [place] [address]  CBC     Status: Abnormal   Collection Time: 06/20/20  3:42 PM  Result Value Ref Range   WBC 6.8 4.0 - 10.5 K/uL   RBC 4.05 3.87 - 5.11 MIL/uL   Hemoglobin 9.7 (L) 12.0 - 15.0 g/dL   HCT 87.6 (L) 36 - 46 %   MCV 70.6 (L) 80.0 - 100.0 fL   MCH  24.0 (L) 26.0 - 34.0 pg   MCHC 33.9 30.0 - 36.0 g/dL   RDW 18.2 (H) 99.3 - 71.6 %   Platelets 151 150 - 400 K/uL    Comment: REPEATED TO VERIFY   nRBC 0.0 0.0 - 0.2 %    Comment: Performed at [facility], [address] [place], [place] [address]  Troponin I (High Sensitivity)     Status: None   Collection Time: 06/20/20  3:42 PM  Result Value Ref Range   Troponin I (High Sensitivity) <2 <18 ng/L    Comment: (NOTE) Elevated high sensitivity troponin I (hsTnI) values and significant  changes across serial measurements may suggest ACS but many other  chronic and acute conditions are known to elevate hsTnI results.  Refer to the "Links" section for chest pain algorithms and additional  guidance. Performed at [facility], [address] [place], [place] [address]   I-Stat beta hCG blood, ED     Status: Abnormal   Collection Time: 06/20/20  3:49 PM  Result Value Ref Range   I-stat hCG, quantitative >2,000.0 (H) <5 mIU/mL   Comment 3            Comment:   GEST. AGE      CONC.  (mIU/mL)   <=1 WEEK        5 - 50     2 WEEKS       50 - 500     3 WEEKS       100 - 10,000     4 WEEKS     1,000 - 30,000        FEMALE AND NON-PREGNANT FEMALE:     LESS THAN 5 mIU/mL   D-dimer, quantitative (not at [facility])     Status: Abnormal   Collection Time: 06/20/20  4:25 PM  Result Value Ref Range   D-Dimer, Quant 0.69 (H) 0.00 - 0.50 ug/mL-FEU    Comment: (NOTE) At the manufacturer cut-off of 0.50 ug/mL FEU, this assay has been documented to exclude PE with a sensitivity and negative predictive value of 97 to 99%.  At this time, this assay has not been approved by the FDA to exclude DVT/VTE. Results should be correlated with clinical presentation. Performed at [facility], [address], [place], [place] [address]   Reticulocytes     Status: Abnormal   Collection Time: 06/20/20  4:33 PM  Result Value Ref Range   Retic Ct Pct 4.1 (H) 0.4 - 3.1 %   RBC. 4.03 3.87 - 5.11 MIL/uL   Retic Count, Absolute 164.0 19.0 - 186.0 K/uL   Immature Retic Fract 27.4 (H) 2.3 - 15.9 %    Comment: Performed at [facility], [address] [place], [place] [address]  Urinalysis, Routine w reflex microscopic     Status: Abnormal   Collection Time: 06/20/20  5:50 PM  Result Value Ref Range   Color, Urine YELLOW YELLOW   APPearance CLEAR CLEAR   Specific Gravity, Urine 1.008 1.005 - 1.030   pH 5.0 5.0 - 8.0   Glucose, UA NEGATIVE NEGATIVE mg/dL   Hgb urine dipstick NEGATIVE NEGATIVE   Bilirubin Urine NEGATIVE NEGATIVE   Ketones, ur NEGATIVE NEGATIVE mg/dL   Protein, ur NEGATIVE NEGATIVE mg/dL   Nitrite NEGATIVE NEGATIVE   Leukocytes,Ua TRACE (A) NEGATIVE   RBC / HPF 0-5 0 - 5 RBC/hpf   WBC, UA 6-10 0 - 5  WBC/hpf  Bacteria, UA RARE (A) NONE SEEN   Squamous Epithelial / LPF 0-5 0 - 5    Comment: Performed at [facility], [address] [place], [place] [address]  Troponin I (High Sensitivity)     Status: None   Collection Time: 06/20/20  7:26 PM  Result Value Ref Range   Troponin I (High Sensitivity) <2 <18 ng/L    Comment: (NOTE) Elevated high sensitivity troponin I (hsTnI) values and significant  changes across serial measurements may suggest ACS but many other  chronic and acute conditions are known to elevate hsTnI results.  Refer to the "Links" section for chest pain algorithms and additional  guidance. Performed at [facility], [address] [place], [place] [address]    DG Chest 2 View  Result Date: 06/20/2020 CLINICAL DATA:  Chest pain EXAM: CHEST - 2 VIEW COMPARISON:  October 07, 2019 FINDINGS: The heart size and mediastinal contours are within normal limits. Both lungs are clear. The visualized skeletal structures are unremarkable. IMPRESSION: No active cardiopulmonary disease. Electronically Signed   By: Jonna ClarkBindu  Avutu M.D.   On: 06/20/2020 15:56   CT Angio Chest PE W and/or Wo Contrast  Result Date: 06/20/2020 CLINICAL DATA:  Chest pain and back pain. EXAM: CT ANGIOGRAPHY CHEST WITH CONTRAST TECHNIQUE: Multidetector CT imaging of the chest was performed using the standard protocol during bolus administration of intravenous contrast. Multiplanar CT image reconstructions and MIPs were obtained to evaluate the vascular anatomy. CONTRAST:  80mL OMNIPAQUE IOHEXOL 350 MG/ML SOLN COMPARISON:  May 28, 2016 FINDINGS: Cardiovascular: Satisfactory opacification of the pulmonary arteries to the segmental level. No evidence of pulmonary embolism. Normal heart size. No pericardial effusion. Mediastinum/Nodes: No enlarged mediastinal, hilar, or axillary lymph nodes. Thyroid gland, trachea, and esophagus demonstrate no significant findings. Lungs/Pleura:  Very mild atelectasis and/or early infiltrate is seen along the anterolateral aspect of the right upper lobe. Very mild atelectasis is also seen within the posterior aspect of the left lung base. There is no evidence of a pleural effusion or pneumothorax. Upper Abdomen: There is a small hiatal hernia. Musculoskeletal: No chest wall abnormality. No acute or significant osseous findings. Review of the MIP images confirms the above findings. IMPRESSION: 1. No CT evidence of pulmonary embolism. 2. Very mild atelectasis and/or early infiltrate along the anterolateral aspect of the right upper lobe. 3. Small hiatal hernia. Electronically Signed   By: Aram Candelahaddeus  Houston M.D.   On: 06/20/2020 20:12    Pending Labs Unresulted Labs (From admission, onward) Comment          Start     Ordered   06/20/20 2110  SARS Coronavirus 2 by RT PCR (hospital order, performed in [facility]) Nasopharyngeal Nasopharyngeal Swab  (Tier 2 (TAT 2 hrs))  Once,   STAT       Question Answer Comment  Is this test for diagnosis or screening Screening   Symptomatic for COVID-19 as defined by CDC No   Hospitalized for COVID-19 No   Admitted to ICU for COVID-19 No   Previously tested for COVID-19 Yes   Resident in a congregate (group) care setting No   Employed in healthcare setting No   Pregnant Yes   Has patient completed COVID vaccination(s) (2 doses of Pfizer/Moderna 1 dose of [employer]) No      06/20/20 2109   Signed and Held  HIV Antibody (routine testing w rflx)  (HIV Antibody (Routine testing w reflex) panel)  Once,  R        Signed and Held   Signed and Held  CBC  (enoxaparin (LOVENOX)    CrCl >/= 30 ml/min)  Once,   R       Comments: Baseline for enoxaparin therapy IF NOT ALREADY DRAWN.  Notify MD if PLT < 100 K.    Signed and Held   Signed and Held  Creatinine, serum  (enoxaparin (LOVENOX)    CrCl >/= 30 ml/min)  Once,   R       Comments: Baseline for enoxaparin therapy IF NOT ALREADY DRAWN.     Signed and Held   Signed and Held  Creatinine, serum  (enoxaparin (LOVENOX)    CrCl >/= 30 ml/min)  Weekly,   R     Comments: while on enoxaparin therapy    Signed and Held   Signed and Held  Comprehensive metabolic panel  Tomorrow morning,   R        Signed and Held   Signed and Held  CBC with Differential/Platelet  Tomorrow morning,   R        Signed and Held          Vitals/Pain Today's Vitals   06/20/20 1920 06/20/20 2040 06/20/20 2147 06/20/20 2250  BP:   (!) 109/54   Pulse:  87 80 91  Resp:  15 16 (!) 21  Temp:      TempSrc:      SpO2:  100% 100% 100%  Weight:      Height:      PainSc: Asleep       Isolation Precautions No active isolations  Medications Medications  sodium chloride flush (NS) 0.9 % injection 3 mL (3 mLs Intravenous Not Given 06/20/20 1928)  0.45 % sodium chloride infusion ( Intravenous Stopped 06/20/20 2252)  diphenhydrAMINE (BENADRYL) capsule 25-50 mg (has no administration in time range)  promethazine (PHENERGAN) tablet 12.5 mg (12.5 mg Oral Given 06/20/20 1657)  azithromycin (ZITHROMAX) 500 mg in sodium chloride 0.9 % 250 mL IVPB (has no administration in time range)  morphine 4 MG/ML injection 6 mg (6 mg Intravenous Given 06/20/20 1741)  morphine 4 MG/ML injection 8 mg (8 mg Intravenous Given 06/20/20 1657)  promethazine (PHENERGAN) injection 12.5 mg (12.5 mg Intravenous Given 06/20/20 1739)  iohexol (OMNIPAQUE) 350 MG/ML injection 100 mL (80 mLs Intravenous Contrast Given 06/20/20 1940)  cefTRIAXone (ROCEPHIN) 1 g in sodium chloride 0.9 % 100 mL IVPB (0 g Intravenous Stopped 06/20/20 2215)    Mobility walks Low fall risk      R Recommendations: See Admitting Provider Note  Report given to:   Additional Notes:  none

## 2020-06-20 NOTE — ED Notes (Signed)
Pt vomited 2x, on floor and bathroom.

## 2020-06-20 NOTE — ED Triage Notes (Signed)
Pt c/o chest pain, back pain, and bilateral arm pain that began yesterday. Pt has hx of sickle cell. Pt is currently ~2 months pregnant and is only prescribed tylenol for pain control at home.

## 2020-06-20 NOTE — ED Notes (Signed)
She has vomited a few times. IV med. Given in response to this.

## 2020-06-20 NOTE — ED Notes (Signed)
Patient has a blue top in the main lab °

## 2020-06-20 NOTE — ED Provider Notes (Addendum)
[facility]-EMERGENCY DEPT Provider Note   CSN: [id number] Arrival date & time: 06/20/20  1510     History Chief Complaint  Patient presents with  . Sickle Cell Pain Crisis    Nicole Case Rajkumar is a 19 y.o. female.  Nicole Case is a 19 y.o. female with a history of sickle cell anemia, currently 2 months pregnant, who presents to the ED for evaluation of chest pain, back pain and bilateral arm pain.  Patient states that the pain in her back and arms is typical for her sickle cell pain crisis but she does not usually have associated chest pain.  She reports pain is present in the center of her chest, she states it is constant, nothing makes it better or worse.  Pain is not exertional or pleuritic but she does report some associated shortness of breath.  She has had an occasional cough that has been nonproductive.  No fevers or chills.  Has not had her Covid vaccine.  Reports some lower abdominal pain, but this has been present since her pregnancy she was evaluated and had an ultrasound that confirmed an intrauterine pregnancy.  She has not had any vaginal bleeding or discharge.  No dysuria or urinary frequency.  She states that since her pregnancy she is only been given Tylenol to take for her sickle cell pain, prior to becoming pregnant she was on oxycodone.  She states Tylenol is not improving her pain at all.  No other aggravating or alleviating factors.  No known history of PE, not on anticoagulation, no lower extremity swelling or pain.  No history of acute chest that she knows of.        Past Medical History:  Diagnosis Date  . Sickle cell anemia (HCC)   . Sickle cell anemia [facility](HCC)     Patient Active Problem List   Diagnosis Date Noted  . Sore throat 10/06/2019  . Fever 10/06/2019  . Urinary tract infection symptoms 10/06/2019  . Sickle-cell disease with pain (HCC) 08/01/2017  . Sickle cell disease, type [place], with unspecified crisis (HCC) 07/31/2017  .  Sickle cell anemia with pain (HCC) 05/28/2016  . 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 Sickle cell pain crisis (HCC) 01/29/2014  . Sickle cell pain crisis 12/12/2011    Past Surgical History:  Procedure Laterality Date  . NO PAST SURGERIES       OB History    Gravida  1   Para      Term      Preterm      AB      Living        SAB      TAB      Ectopic      Multiple      Live Births              Family History  Problem Relation Age of Onset  . Sickle cell anemia Brother     Social History   Tobacco Use  . Smoking status: Never Smoker  . Smokeless tobacco: Never Used  Vaping Use  . Vaping Use: Never used  Substance Use Topics  . Alcohol use: No  . Drug use: Not Currently    Home Medications Prior to Admission medications   Medication Sig Start Date End Date Taking? Authorizing Provider  acetaminophen (TYLENOL) 325 MG tablet Take 2 tablets (650 mg total) by mouth every 6 (six) hours as needed for mild pain, moderate pain, fever or headache.  Patient not taking: Reported on 05/10/2020 10/08/19   Massie Maroon, FNP  folic acid (FOLVITE) 1 MG tablet Take 1 tablet (1 mg total) by mouth daily. Patient not taking: Reported on 05/10/2020 10/08/19 10/07/20  Massie Maroon, FNP  oxyCODONE (ROXICODONE) 5 MG immediate release tablet Take 1 tablet (5 mg total) by mouth every 4 (four) hours as needed for severe pain. Patient not taking: Reported on 05/10/2020 10/16/19   Massie Maroon, FNP    Allergies    Patient has no known allergies.  Review of Systems   Review of Systems  Constitutional: Negative for chills and fever.  HENT: Negative for congestion, rhinorrhea and sore throat.   Respiratory: Positive for cough and shortness of breath.   Cardiovascular: Positive for chest pain. Negative for palpitations and leg swelling.  Gastrointestinal: Positive for abdominal pain. Negative for nausea and vomiting.  Genitourinary: Negative for dysuria, frequency,  pelvic pain, vaginal bleeding and vaginal discharge.  Musculoskeletal: Positive for arthralgias, back pain and myalgias. Negative for joint swelling.  Skin: Negative for color change and rash.  Neurological: Negative for dizziness, syncope and light-headedness.    Physical Exam Updated Vital Signs BP 123/77   Pulse 94   Temp 98.7 F (37.1 C) (Oral)   Resp 20   Ht  (1.702 m)   Wt 106.6 kg   LMP 03/28/2020   SpO2 100%   BMI 36.81 kg/m   Physical Exam Vitals and nursing note reviewed.  Constitutional:      General: She is not in acute distress.    Appearance: Normal appearance. She is well-developed and normal weight. She is not ill-appearing or diaphoretic.  HENT:     Head: Normocephalic and atraumatic.  Eyes:     General:        Right eye: No discharge.        Left eye: No discharge.  Cardiovascular:     Rate and Rhythm: Regular rhythm. Tachycardia present.     Heart sounds: Normal heart sounds. No murmur heard.  No friction rub. No gallop.   Pulmonary:     Effort: Pulmonary effort is normal. No respiratory distress.     Breath sounds: Normal breath sounds. No wheezing or rales.     Comments: Respirations equal and unlabored, patient able to speak in full sentences, lungs clear to auscultation bilaterally Abdominal:     General: Bowel sounds are normal. There is no distension.     Palpations: Abdomen is soft. There is no mass.     Tenderness: There is no abdominal tenderness. There is no guarding.     Comments: Abdomen soft, nondistended, nontender to palpation in all quadrants without guarding or peritoneal signs  Musculoskeletal:        General: No deformity.     Cervical back: Neck supple.     Right lower leg: No edema.     Left lower leg: No edema.  Skin:    General: Skin is warm and dry.     Capillary Refill: Capillary refill takes less than 2 seconds.  Neurological:     Mental Status: She is alert.     Coordination: Coordination normal.     Comments:  Speech is clear, able to follow commands Moves extremities without ataxia, coordination intact  Psychiatric:        Mood and Affect: Mood normal.        Behavior: Behavior normal.     ED Results / Procedures / Treatments   Labs (  all labs ordered are listed, but only abnormal results are displayed) Labs Reviewed  BASIC METABOLIC PANEL - Abnormal; Notable for the following components:      Result Value   Glucose, Bld 125 (*)    Calcium 8.8 (*)    All other components within normal limits  CBC - Abnormal; Notable for the following components:   Hemoglobin 9.7 (*)    HCT 28.6 (*)    MCV 70.6 (*)    MCH 24.0 (*)    RDW 21.0 (*)    All other components within normal limits  D-DIMER, QUANTITATIVE (NOT AT [facility]) - Abnormal; Notable for the following components:   D-Dimer, Quant 0.69 (*)    All other components within normal limits  RETICULOCYTES - Abnormal; Notable for the following components:   Retic Ct Pct 4.1 (*)    Immature Retic Fract 27.4 (*)    All other components within normal limits  URINALYSIS, ROUTINE W REFLEX MICROSCOPIC - Abnormal; Notable for the following components:   Leukocytes,Ua TRACE (*)    Bacteria, UA RARE (*)    All other components within normal limits  I-STAT BETA HCG BLOOD, ED (NOT ORDERABLE) - Abnormal; Notable for the following components:   I-stat hCG, quantitative >2,000.0 (*)    All other components within normal limits  SARS CORONAVIRUS 2 BY RT PCR (HOSPITAL ORDER, PERFORMED IN [facility])  I-STAT BETA HCG BLOOD, ED ([facility], [facility], [facility] ONLY)  TROPONIN I (HIGH SENSITIVITY)  TROPONIN I (HIGH SENSITIVITY)    EKG EKG Interpretation  Date/Time:  Sunday June 20 2020 22:38:48 EDT Ventricular Rate:  85 PR Interval:    QRS Duration: 77 QT Interval:  334 QTC Calculation: 398 R Axis:   68 Text Interpretation: Sinus rhythm Borderline T abnormalities, diffuse leads No significant change since last tracing Confirmed by Richardean Canal [phone]) on  06/20/2020 10:41:59 PM   Radiology DG Chest 2 View  Result Date: 06/20/2020 CLINICAL DATA:  Chest pain EXAM: CHEST - 2 VIEW COMPARISON:  October 07, 2019 FINDINGS: The heart size and mediastinal contours are within normal limits. Both lungs are clear. The visualized skeletal structures are unremarkable. IMPRESSION: No active cardiopulmonary disease. Electronically Signed   By: Jonna Clark M.D.   On: 06/20/2020 15:56   CT Angio Chest PE W and/or Wo Contrast  Result Date: 06/20/2020 CLINICAL DATA:  Chest pain and back pain. EXAM: CT ANGIOGRAPHY CHEST WITH CONTRAST TECHNIQUE: Multidetector CT imaging of the chest was performed using the standard protocol during bolus administration of intravenous contrast. Multiplanar CT image reconstructions and MIPs were obtained to evaluate the vascular anatomy. CONTRAST:  70mL OMNIPAQUE IOHEXOL 350 MG/ML SOLN COMPARISON:  May 28, 2016 FINDINGS: Cardiovascular: Satisfactory opacification of the pulmonary arteries to the segmental level. No evidence of pulmonary embolism. Normal heart size. No pericardial effusion. Mediastinum/Nodes: No enlarged mediastinal, hilar, or axillary lymph nodes. Thyroid gland, trachea, and esophagus demonstrate no significant findings. Lungs/Pleura: Very mild atelectasis and/or early infiltrate is seen along the anterolateral aspect of the right upper lobe. Very mild atelectasis is also seen within the posterior aspect of the left lung base. There is no evidence of a pleural effusion or pneumothorax. Upper Abdomen: There is a small hiatal hernia. Musculoskeletal: No chest wall abnormality. No acute or significant osseous findings. Review of the MIP images confirms the above findings. IMPRESSION: 1. No CT evidence of pulmonary embolism. 2. Very mild atelectasis and/or early infiltrate along the anterolateral aspect of the right upper lobe.  3. Small hiatal hernia. Electronically Signed   By: Aram Candela M.D.   On: 06/20/2020 20:12     Procedures Procedures (including critical care time)  Medications Ordered in ED Medications  sodium chloride flush (NS) 0.9 % injection 3 mL (3 mLs Intravenous Not Given 06/20/20 1928)  0.45 % sodium chloride infusion ( Intravenous New Bag/Given 06/20/20 1657)  diphenhydrAMINE (BENADRYL) capsule 25-50 mg (has no administration in time range)  promethazine (PHENERGAN) tablet 12.5 mg (12.5 mg Oral Given 06/20/20 1657)  azithromycin (ZITHROMAX) 500 mg in sodium chloride 0.9 % 250 mL IVPB (has no administration in time range)  morphine 4 MG/ML injection 6 mg (6 mg Intravenous Given 06/20/20 1741)  morphine 4 MG/ML injection 8 mg (8 mg Intravenous Given 06/20/20 1657)  promethazine (PHENERGAN) injection 12.5 mg (12.5 mg Intravenous Given 06/20/20 1739)  iohexol (OMNIPAQUE) 350 MG/ML injection 100 mL (80 mLs Intravenous Contrast Given 06/20/20 1940)  cefTRIAXone (ROCEPHIN) 1 g in sodium chloride 0.9 % 100 mL IVPB (1 g Intravenous New Bag/Given 06/20/20 2145)    ED Course  I have reviewed the triage vital signs and the nursing notes.  Pertinent labs & imaging results that were available during my care of the patient were reviewed by me and considered in my medical decision making (see chart for details).    MDM Rules/Calculators/A&P                         19 year old female with sickle cell, 2 months pregnant, presents with pain in her back and arms typical of her sickle cell crisis for 2 days, but also complains of central chest pain with some shortness of breath and an occasional cough.  No fevers.  Has not had Covid vaccine.  Will get basic labs, retake count, UA, Covid swab, but given chest pain we will also check troponin, D-dimer, EKG and chest x-ray.  Given pregnancy and sickle cell she is at high risk for PE.  On my evaluation she is mildly tachycardic.  Discussed risks and benefits of pain medication use for management of sickle cell crisis in the setting of pregnancy and patient is  agreeable with using pain medication, started on sickle cell protocol.  I have independently ordered, reviewed and interpreted all labs and imaging: CBC: No leukocytosis, hemoglobin at baseline BMP: Glucose 125, calcium 8.8, no other electrolyte derangements, normal renal function. UA: Trace leukocytes but only rare bacteria present Covid: Pending Troponin: Negative D-dimer: Elevated at 0.69 we will proceed with CTA CXR: No active cardiopulmonary disease  CTA: No evidence of PE, but there is a small area of atelectasis or early infiltrate along the anterior lateral aspect of the right upper lobe  On reevaluation patient states her pain is improved but she is very worried about going home given that she only has Tylenol at home she is concerned she will be back tomorrow, on review of patient's chart she is often required admission for her sickle cell crisis.  Would also be more cautious with pneumonia and patient this both sickle cell and pregnant.  Discussed with pharmacy and they still recommend Rocephin and azithromycin in pregnancy for pneumonia, patient started on antibiotics and medicine consulted for admission.  Case discussed with Dr. Mikeal Hawthorne who will see and admit the patient  Final Clinical Impression(s) / ED Diagnoses Final diagnoses:  Sickle cell pain crisis [facility])  Community acquired pneumonia of right lower lobe of lung  Central chest pain  Rx / DC Orders ED Discharge Orders    None       Legrand Rams 06/20/20 2237    Dartha Lodge, PA-C 06/20/20 2244    Charlynne Pander, MD 06/20/20 [phone]

## 2020-06-20 NOTE — H&P (Signed)
History and Physical   Nicole Case SFK:[id number] DOB: [birth year] DOA: 06/20/2020  Referring MD/NP/PA: Dr. Silverio Lay  PCP: [facility] (Inactive)   Outpatient Specialists: None  Patient coming from: Home  Chief Complaint: Generalized pain and cough  HPI: Nicole Case is a 19 y.o. female with medical history significant of sickle cell disease, previous UTIs, chronic pain syndrome, anemia of chronic disease and previous pneumonia who presented to the ER with 3 to 4 days of cough no fever or chills.  Also pain in her legs and back consistent with her typical sickle cell crisis.  Patient reports her pain today is at 7 out of 10.  Currently down to 5 out of 10.  Denied any nausea vomiting or diarrhea.  Denied any hemoptysis.  No sick contact.  Patient seen in the ER she was found to have pneumonia in addition to her sickle cell crisis.  She is being admitted to the hospital for further treatment..  ED Course: Temperature 98.7 blood pressure 109/54 pulse 104 respirate 24 oxygen sat 99% room air.  White count 6.8 hemoglobin 9.7 platelets 151.  Chemistry mostly within normal except for calcium 8.8.  Urinalysis is negative.  CT angiogram of the chest showed no evidence of PE.  Small hiatal hernia very mild atelectasis or infiltrate along the anterolateral aspect of the right upper lobe.  She is being admitted with suspected pneumonia as well as sickle cell crisis.  Review of Systems: As per HPI otherwise 10 point review of systems negative.    Past Medical History:  Diagnosis Date  . Sickle cell anemia (HCC)   . Sickle cell anemia (HCC)     Past Surgical History:  Procedure Laterality Date  . NO PAST SURGERIES       reports that she has never smoked. She has never used smokeless tobacco. She reports previous drug use. She reports that she does not drink alcohol.  No Known Allergies  Family History  Problem Relation Age of Onset  . Sickle cell anemia Brother      Prior  to Admission medications   Medication Sig Start Date End Date Taking? Authorizing Provider  acetaminophen (TYLENOL) 325 MG tablet Take 2 tablets (650 mg total) by mouth every 6 (six) hours as needed for mild pain, moderate pain, fever or headache. Patient not taking: Reported on 05/10/2020 10/08/19   Massie Maroon, FNP  folic acid (FOLVITE) 1 MG tablet Take 1 tablet (1 mg total) by mouth daily. Patient not taking: Reported on 05/10/2020 10/08/19 10/07/20  Massie Maroon, FNP  oxyCODONE (ROXICODONE) 5 MG immediate release tablet Take 1 tablet (5 mg total) by mouth every 4 (four) hours as needed for severe pain. Patient not taking: Reported on 05/10/2020 10/16/19   Massie Maroon, FNP    Physical Exam: Vitals:   06/20/20 1804 06/20/20 1856 06/20/20 2040 06/20/20 2147  BP: (!) 129/92 135/73  (!) 109/54  Pulse: 95 92 87 80  Resp: 15 (!) 22 15 16   Temp:      TempSrc:      SpO2: 100% 100% 100% 100%  Weight:      Height:          Constitutional: NAD, mild distress Vitals:   06/20/20 1804 06/20/20 1856 06/20/20 2040 06/20/20 2147  BP: (!) 129/92 135/73  (!) 109/54  Pulse: 95 92 87 80  Resp: 15 (!) 22 15 16   Temp:      TempSrc:  SpO2: 100% 100% 100% 100%  Weight:      Height:       Eyes: PERRL, lids and conjunctivae normal ENMT: Mucous membranes are moist. Posterior pharynx clear of any exudate or lesions.Normal dentition.  Neck: normal, supple, no masses, no thyromegaly Respiratory: clear to auscultation bilaterally, no wheezing, no crackles. Normal respiratory effort. No accessory muscle use.  Cardiovascular: Regular rate and rhythm, no murmurs / rubs / gallops. No extremity edema. 2+ pedal pulses. No carotid bruits.  Abdomen: no tenderness, no masses palpated. No hepatosplenomegaly. Bowel sounds positive.  Musculoskeletal: no clubbing / cyanosis. No joint deformity upper and lower extremities. Good ROM, no contractures. Normal muscle tone.  Skin: no rashes, lesions, ulcers.  No induration Neurologic: CN 2-12 grossly intact. Sensation intact, DTR normal. Strength 5/5 in all 4.  Psychiatric: Normal judgment and insight. Alert and oriented x 3. Normal mood.     Labs on Admission: I have personally reviewed following labs and imaging studies  CBC: Recent Labs  Lab 06/20/20 1542  WBC 6.8  HGB 9.7*  HCT 28.6*  MCV 70.6*  PLT 151   Basic Metabolic Panel: Recent Labs  Lab 06/20/20 1542  NA 135  K 3.6  CL 104  CO2 23  GLUCOSE 125*  BUN 10  CREATININE 0.47  CALCIUM 8.8*   GFR: Estimated Creatinine Clearance: 139.8 mL/min (by C-G formula based on SCr of 0.47 mg/dL). Liver Function Tests: No results for input(s): AST, ALT, ALKPHOS, BILITOT, PROT, ALBUMIN in the last 168 hours. No results for input(s): LIPASE, AMYLASE in the last 168 hours. No results for input(s): AMMONIA in the last 168 hours. Coagulation Profile: No results for input(s): INR, PROTIME in the last 168 hours. Cardiac Enzymes: No results for input(s): CKTOTAL, CKMB, CKMBINDEX, TROPONINI in the last 168 hours. BNP (last 3 results) No results for input(s): PROBNP in the last 8760 hours. HbA1C: No results for input(s): HGBA1C in the last 72 hours. CBG: No results for input(s): GLUCAP in the last 168 hours. Lipid Profile: No results for input(s): CHOL, HDL, LDLCALC, TRIG, CHOLHDL, LDLDIRECT in the last 72 hours. Thyroid Function Tests: No results for input(s): TSH, T4TOTAL, FREET4, T3FREE, THYROIDAB in the last 72 hours. Anemia Panel: Recent Labs    06/20/20 1633  RETICCTPCT 4.1*   Urine analysis:    Component Value Date/Time   COLORURINE YELLOW 06/20/2020 1750   APPEARANCEUR CLEAR 06/20/2020 1750   LABSPEC 1.008 06/20/2020 1750   PHURINE 5.0 06/20/2020 1750   GLUCOSEU NEGATIVE 06/20/2020 1750   HGBUR NEGATIVE 06/20/2020 1750   BILIRUBINUR NEGATIVE 06/20/2020 1750   KETONESUR NEGATIVE 06/20/2020 1750   PROTEINUR NEGATIVE 06/20/2020 1750   UROBILINOGEN 0.2 05/05/2020  1302   NITRITE NEGATIVE 06/20/2020 1750   LEUKOCYTESUR TRACE (A) 06/20/2020 1750   Sepsis Labs: @LABRCNTIP (procalcitonin:4,lacticidven:4) )No results found for this or any previous visit (from the past 240 hour(s)).   Radiological Exams on Admission: DG Chest 2 View  Result Date: 06/20/2020 CLINICAL DATA:  Chest pain EXAM: CHEST - 2 VIEW COMPARISON:  October 07, 2019 FINDINGS: The heart size and mediastinal contours are within normal limits. Both lungs are clear. The visualized skeletal structures are unremarkable. IMPRESSION: No active cardiopulmonary disease. Electronically Signed   By: October 09, 2019 M.D.   On: 06/20/2020 15:56   CT Angio Chest PE W and/or Wo Contrast  Result Date: 06/20/2020 CLINICAL DATA:  Chest pain and back pain. EXAM: CT ANGIOGRAPHY CHEST WITH CONTRAST TECHNIQUE: Multidetector CT imaging of the chest was  performed using the standard protocol during bolus administration of intravenous contrast. Multiplanar CT image reconstructions and MIPs were obtained to evaluate the vascular anatomy. CONTRAST:  33mL OMNIPAQUE IOHEXOL 350 MG/ML SOLN COMPARISON:  May 28, 2016 FINDINGS: Cardiovascular: Satisfactory opacification of the pulmonary arteries to the segmental level. No evidence of pulmonary embolism. Normal heart size. No pericardial effusion. Mediastinum/Nodes: No enlarged mediastinal, hilar, or axillary lymph nodes. Thyroid gland, trachea, and esophagus demonstrate no significant findings. Lungs/Pleura: Very mild atelectasis and/or early infiltrate is seen along the anterolateral aspect of the right upper lobe. Very mild atelectasis is also seen within the posterior aspect of the left lung base. There is no evidence of a pleural effusion or pneumothorax. Upper Abdomen: There is a small hiatal hernia. Musculoskeletal: No chest wall abnormality. No acute or significant osseous findings. Review of the MIP images confirms the above findings. IMPRESSION: 1. No CT evidence of pulmonary  embolism. 2. Very mild atelectasis and/or early infiltrate along the anterolateral aspect of the right upper lobe. 3. Small hiatal hernia. Electronically Signed   By: Aram Candela M.D.   On: 06/20/2020 20:12      Assessment/Plan Active Problems:   Community acquired pneumonia   Sickle cell anemia with crisis (HCC)     #1 sickle cell anemia with crisis: Patient will be admitted and be treated with IV fluids.  IV Dilaudid as needed.  No Toradol due to pregnancy.  #2 community-acquired pneumonia: Patient is afebrile no white count.  I suspect this may just be atelectasis..  Continue with IV Rocephin.  Other antibiotics may not be suitable in the setting of pregnancy.  Get blood cultures.  #3 first trimester pregnancy: Patient is only 2 months pregnant.  At this point will continue with close monitoring and treat her sickle cell crisis.  She will follow-up with OB/GYN for her is progressive.   DVT prophylaxis: Lovenox Code Status: Full code Family Communication: Boyfriend at bedside Disposition Plan: Home Consults called: None Admission status: Inpatient  Severity of Illness: The appropriate patient status for this patient is INPATIENT. Inpatient status is judged to be reasonable and necessary in order to provide the required intensity of service to ensure the patient's safety. The patient's presenting symptoms, physical exam findings, and initial radiographic and laboratory data in the context of their chronic comorbidities is felt to place them at high risk for further clinical deterioration. Furthermore, it is not anticipated that the patient will be medically stable for discharge from the hospital within 2 midnights of admission. The following factors support the patient status of inpatient.   " The patient's presenting symptoms include pain all over. " The worrisome physical exam findings include no significant finding on exam. " The initial radiographic and laboratory data are  worrisome because of possible pneumonia on x-ray and CT chest. " The chronic co-morbidities include sickle cell anemia.   * I certify that at the point of admission it is my clinical judgment that the patient will require inpatient hospital care spanning beyond 2 midnights from the point of admission due to high intensity of service, high risk for further deterioration and high frequency of surveillance required.Lonia Blood MD [facility] Pager [phone]  If 7PM-7AM, please contact night-coverage [email or web address] Password [facility]  06/20/2020, 10:13 PM

## 2020-06-21 ENCOUNTER — Encounter (HOSPITAL_COMMUNITY): Payer: Self-pay | Admitting: Internal Medicine

## 2020-06-21 DIAGNOSIS — J189 Pneumonia, unspecified organism: Secondary | ICD-10-CM | POA: Diagnosis not present

## 2020-06-21 DIAGNOSIS — R079 Chest pain, unspecified: Secondary | ICD-10-CM

## 2020-06-21 DIAGNOSIS — D5700 Hb-SS disease with crisis, unspecified: Secondary | ICD-10-CM | POA: Diagnosis not present

## 2020-06-21 LAB — CBC WITH DIFFERENTIAL/PLATELET
Abs Immature Granulocytes: 0.02 10*3/uL (ref 0.00–0.07)
Basophils Absolute: 0 10*3/uL (ref 0.0–0.1)
Basophils Relative: 0 %
Eosinophils Absolute: 0.1 10*3/uL (ref 0.0–0.5)
Eosinophils Relative: 1 %
HCT: 26.1 % — ABNORMAL LOW (ref 36.0–46.0)
Hemoglobin: 9.3 g/dL — ABNORMAL LOW (ref 12.0–15.0)
Immature Granulocytes: 0 %
Lymphocytes Relative: 23 %
Lymphs Abs: 1.6 10*3/uL (ref 0.7–4.0)
MCH: 24.9 pg — ABNORMAL LOW (ref 26.0–34.0)
MCHC: 35.6 g/dL (ref 30.0–36.0)
MCV: 69.8 fL — ABNORMAL LOW (ref 80.0–100.0)
Monocytes Absolute: 0.6 10*3/uL (ref 0.1–1.0)
Monocytes Relative: 8 %
Neutro Abs: 4.7 10*3/uL (ref 1.7–7.7)
Neutrophils Relative %: 68 %
Platelets: 134 10*3/uL — ABNORMAL LOW (ref 150–400)
RBC: 3.74 MIL/uL — ABNORMAL LOW (ref 3.87–5.11)
RDW: 21 % — ABNORMAL HIGH (ref 11.5–15.5)
WBC: 6.9 10*3/uL (ref 4.0–10.5)
nRBC: 0 % (ref 0.0–0.2)

## 2020-06-21 LAB — COMPREHENSIVE METABOLIC PANEL
ALT: 26 U/L (ref 0–44)
AST: 27 U/L (ref 15–41)
Albumin: 3.3 g/dL — ABNORMAL LOW (ref 3.5–5.0)
Alkaline Phosphatase: 41 U/L (ref 38–126)
Anion gap: 5 (ref 5–15)
BUN: 7 mg/dL (ref 6–20)
CO2: 25 mmol/L (ref 22–32)
Calcium: 8.4 mg/dL — ABNORMAL LOW (ref 8.9–10.3)
Chloride: 105 mmol/L (ref 98–111)
Creatinine, Ser: 0.41 mg/dL — ABNORMAL LOW (ref 0.44–1.00)
GFR calc Af Amer: >60 mL/min (ref >60)
GFR calc non Af Amer: >60 mL/min (ref >60)
Glucose, Bld: 99 mg/dL (ref 70–99)
Potassium: 3.8 mmol/L (ref 3.5–5.1)
Sodium: 135 mmol/L (ref 135–145)
Total Bilirubin: 1 mg/dL (ref 0.3–1.2)
Total Protein: 6.9 g/dL (ref 6.5–8.1)

## 2020-06-21 LAB — HIV ANTIBODY (ROUTINE TESTING W REFLEX): HIV Screen 4th Generation wRfx: NONREACTIVE

## 2020-06-21 MED ORDER — OXYCODONE HCL 5 MG PO TABS
5.0000 mg | ORAL_TABLET | ORAL | Status: DC | PRN
  Administered 2020-06-21: 5 mg via ORAL
  Filled 2020-06-21: qty 1

## 2020-06-21 MED ORDER — ACETAMINOPHEN 325 MG PO TABS
325.0000 mg | ORAL_TABLET | ORAL | Status: DC | PRN
  Administered 2020-06-21: 325 mg via ORAL
  Filled 2020-06-21: qty 1

## 2020-06-21 MED ORDER — ONDANSETRON HCL 4 MG/2ML IJ SOLN
4.0000 mg | Freq: Four times a day (QID) | INTRAMUSCULAR | Status: DC | PRN
  Administered 2020-06-21: 4 mg via INTRAVENOUS
  Filled 2020-06-21: qty 2

## 2020-06-21 MED ORDER — HYDROMORPHONE HCL 1 MG/ML IJ SOLN
1.0000 mg | INTRAMUSCULAR | Status: DC | PRN
  Administered 2020-06-21 – 2020-06-22 (×3): 1 mg via INTRAVENOUS
  Filled 2020-06-21 (×3): qty 1

## 2020-06-21 NOTE — Progress Notes (Signed)
Subjective: Nicole Case is a 19 year old female with a medical history significant for sickle cell disease that was admitted for sickle cell pain crisis.  Patient is also in first trimester pregnancy. Patient continues to endorse some nausea.  She is also complaining of pain primarily to low back and lower extremities.  Pain intensity is 5/10.  Patient is afebrile and oxygen saturations 100% on RA. She denies any headache, chest pain, shortness of breath, urinary symptoms, constipation, or diarrhea.  Objective:  Vital signs in last 24 hours:  Vitals:   06/20/20 2250 06/20/20 2331 06/21/20 0416 06/21/20 0730  BP:  120/68 117/67 125/66  Pulse: 91 98 88 84  Resp: (!) 21 18 18 16   Temp:  98 F (36.7 C) 97.7 F (36.5 C)   TempSrc:  Oral Oral   SpO2: 100% 100% 100% 97%  Weight:      Height:        Intake/Output from previous day:   Intake/Output Summary (Last 24 hours) at 06/21/2020 1138 Last data filed at 06/20/2020 2252 Gross per 24 hour  Intake 691.67 ml  Output --  Net 691.67 ml    Physical Exam: General: Alert, awake, oriented x3, in no acute distress.  HEENT: /AT PEERL, EOMI Neck: Trachea midline,  no masses, no thyromegal,y no JVD, no carotid bruit OROPHARYNX:  Moist, No exudate/ erythema/lesions.  Heart: Regular rate and rhythm, without murmurs, rubs, gallops, PMI non-displaced, no heaves or thrills on palpation.  Lungs: Clear to auscultation, no wheezing or rhonchi noted. No increased vocal fremitus resonant to percussion  Abdomen: Soft, nontender, nondistended, positive bowel sounds, no masses no hepatosplenomegaly noted..  Neuro: No focal neurological deficits noted cranial nerves II through XII grossly intact. DTRs 2+ bilaterally upper and lower extremities. Strength 5 out of 5 in bilateral upper and lower extremities. Musculoskeletal: No warm swelling or erythema around joints, no spinal tenderness noted. Psychiatric: Patient alert and oriented x3, good insight  and cognition, good recent to remote recall. Lymph node survey: No cervical axillary or inguinal lymphadenopathy noted.  Lab Results:  Basic Metabolic Panel:    Component Value Date/Time   NA 135 06/21/2020 0615   K 3.8 06/21/2020 0615   CL 105 06/21/2020 0615   CO2 25 06/21/2020 0615   BUN 7 06/21/2020 0615   CREATININE 0.41 (L) 06/21/2020 0615   GLUCOSE 99 06/21/2020 0615   CALCIUM 8.4 (L) 06/21/2020 0615   CBC:    Component Value Date/Time   WBC 6.9 06/21/2020 0615   HGB 9.3 (L) 06/21/2020 0615   HCT 26.1 (L) 06/21/2020 0615   PLT 134 (L) 06/21/2020 0615   MCV 69.8 (L) 06/21/2020 0615   NEUTROABS 4.7 06/21/2020 0615   LYMPHSABS 1.6 06/21/2020 0615   MONOABS 0.6 06/21/2020 0615   EOSABS 0.1 06/21/2020 0615   BASOSABS 0.0 06/21/2020 0615    Recent Results (from the past 240 hour(s))  SARS Coronavirus 2 by RT PCR (hospital order, performed in [facility] hospital lab) Nasopharyngeal Nasopharyngeal Swab     Status: None   Collection Time: 06/20/20  9:47 PM   Specimen: Nasopharyngeal Swab  Result Value Ref Range Status   SARS Coronavirus 2 NEGATIVE NEGATIVE Final    Comment: (NOTE) SARS-CoV-2 target nucleic acids are NOT DETECTED.  The SARS-CoV-2 RNA is generally detectable in upper and lower respiratory specimens during the acute phase of infection. The lowest concentration of SARS-CoV-2 viral copies this assay can detect is 250 copies / mL. A negative result  does not preclude SARS-CoV-2 infection and should not be used as the sole basis for treatment or other patient management decisions.  A negative result may occur with improper specimen collection / handling, submission of specimen other than nasopharyngeal swab, presence of viral mutation(s) within the areas targeted by this assay, and inadequate number of viral copies (<250 copies / mL). A negative result must be combined with clinical observations, patient history, and epidemiological information.  Fact  Sheet for Patients:   BoilerBrush.com.cy  Fact Sheet for Healthcare Providers: https://pope.com/  This test is not yet approved or  cleared by the [place] FDA and has been authorized for detection and/or diagnosis of SARS-CoV-2 by FDA under an Emergency Use Authorization (EUA).  This EUA will remain in effect (meaning this test can be used) for the duration of the COVID-19 declaration under Section 564(b)(1) of the Act, 21 U.S.C. section 360bbb-3(b)(1), unless the authorization is terminated or revoked sooner.  Performed at [facility], [address] [place], [place] [address]     Studies/Results: DG Chest 2 View  Result Date: 06/20/2020 CLINICAL DATA:  Chest pain EXAM: CHEST - 2 VIEW COMPARISON:  October 07, 2019 FINDINGS: The heart size and mediastinal contours are within normal limits. Both lungs are clear. The visualized skeletal structures are unremarkable. IMPRESSION: No active cardiopulmonary disease. Electronically Signed   By: Jonna Clark M.D.   On: 06/20/2020 15:56   CT Angio Chest PE W and/or Wo Contrast  Result Date: 06/20/2020 CLINICAL DATA:  Chest pain and back pain. EXAM: CT ANGIOGRAPHY CHEST WITH CONTRAST TECHNIQUE: Multidetector CT imaging of the chest was performed using the standard protocol during bolus administration of intravenous contrast. Multiplanar CT image reconstructions and MIPs were obtained to evaluate the vascular anatomy. CONTRAST:  56mL OMNIPAQUE IOHEXOL 350 MG/ML SOLN COMPARISON:  May 28, 2016 FINDINGS: Cardiovascular: Satisfactory opacification of the pulmonary arteries to the segmental level. No evidence of pulmonary embolism. Normal heart size. No pericardial effusion. Mediastinum/Nodes: No enlarged mediastinal, hilar, or axillary lymph nodes. Thyroid gland, trachea, and esophagus demonstrate no significant findings. Lungs/Pleura: Very mild atelectasis and/or early infiltrate  is seen along the anterolateral aspect of the right upper lobe. Very mild atelectasis is also seen within the posterior aspect of the left lung base. There is no evidence of a pleural effusion or pneumothorax. Upper Abdomen: There is a small hiatal hernia. Musculoskeletal: No chest wall abnormality. No acute or significant osseous findings. Review of the MIP images confirms the above findings. IMPRESSION: 1. No CT evidence of pulmonary embolism. 2. Very mild atelectasis and/or early infiltrate along the anterolateral aspect of the right upper lobe. 3. Small hiatal hernia. Electronically Signed   By: Aram Candela M.D.   On: 06/20/2020 20:12    Medications: Scheduled Meds: . enoxaparin (LOVENOX) injection  40 mg Subcutaneous QHS  . senna-docusate  1 tablet Oral BID  . sodium chloride flush  3 mL Intravenous Once   Continuous Infusions: . sodium chloride Stopped (06/20/20 2252)  . cefTRIAXone (ROCEPHIN)  IV     PRN Meds:.acetaminophen, diphenhydrAMINE, HYDROmorphone (DILAUDID) injection, ondansetron (ZOFRAN) IV, oxyCODONE, polyethylene glycol, promethazine  Consultants:  None  Procedures:  None  Antibiotics:  IV Rocephin  Assessment/Plan: Active Problems:   Community acquired pneumonia   Sickle cell anemia with crisis (HCC)   Sickle cell disease with pain crisis: Continue IV fluids Dilaudid 1 mg IV every 4 hours as needed Oxycodone 5 mg every 4 hours as needed for severe breakthrough pain  No Toradol due to pregnancy Monitor vital signs closely, reevaluate pain scale regularly, and supplemental oxygen as needed.  Community-acquired pneumonia: Patient continues to be afebrile.  WBCs within normal limits.  Atelectasis suspected.  Continue IV Rocephin.  Consider discontinuing if patient remains afebrile.  Sickle cell anemia: Today, hemoglobin is 9.3.  Appears to be consistent with patient's baseline.  There is no clinical indication for blood transfusion at this time.   Repeat CBC in a.m.  First trimester pregnancy: Patient is around 2 months pregnant.  Continue prenatal vitamins.  Close monitoring warranted, OB consult not needed at this time.  Patient to follow-up as an outpatient with OB/GYN as pregnancy progresses.  Code Status: Full Code Family Communication: N/A Disposition Plan: Not yet ready for discharge  Kimberlin Scheel Rennis Petty  APRN, MSN, FNP-C [facility] [address] [place], [place] [address] [phone]  If 5PM-7AM, please contact night-coverage.  06/21/2020, 11:38 AM  LOS: 1 day

## 2020-06-22 ENCOUNTER — Telehealth: Payer: Self-pay

## 2020-06-22 DIAGNOSIS — D5700 Hb-SS disease with crisis, unspecified: Secondary | ICD-10-CM | POA: Diagnosis not present

## 2020-06-22 MED ORDER — ONDANSETRON HCL 4 MG PO TABS: 4.0000 mg | ORAL_TABLET | Freq: Every day | ORAL | 0 refills | Status: DC | PRN

## 2020-06-22 MED ORDER — OXYCODONE HCL 5 MG PO TABS: 5.0000 mg | ORAL_TABLET | ORAL | 0 refills | Status: DC | PRN

## 2020-06-22 NOTE — Discharge Summary (Signed)
Physician Discharge Summary  Nicole Case JKD:[id number] DOB: 06/20/2001 DOA: 06/20/2020  PCP: [facility]  Admit date: 06/20/2020  Discharge date: 06/23/2020  Discharge Diagnoses:  Principal Problem:   Sickle cell anemia with crisis [facility]) Active Problems:   Community acquired pneumonia   Central chest pain   Discharge Condition: Stable  Disposition:  Pt is discharged home in good condition and is to follow up with [facility] this week to have labs evaluated. Nicole Case is instructed to increase activity slowly and balance with rest for the next few days, and use prescribed medication to complete treatment of pain  Diet: Regular Wt Readings from Last 3 Encounters:  06/20/20 106.6 kg  05/07/20 117.6 kg  05/05/20 97.5 kg    History of present illness:  Nicole Case is a 19 year old female with a medical history significant for sickle cell disease, history of recurrent UTIs, chronic pain syndrome, anemia of chronic disease, and previous pneumonia who presented to the ER with 3 to 4 days of cough, no fever or chills.  Also, pain in her legs and back is consistent with her typical sickle cell crisis.  Patient reports her pain today is 7/10.  Currently down to 5/10.  She denies any nausea, vomiting, or diarrhea.  Denied any hemoptysis.  No sick contacts.  Patient seen in the ER was found to have pneumonia in addition to her sickle cell crisis.  She is being admitted to hospital for further treatment.  ER course: Temperature 98.7, BP 109/54, pulse 104, respirations 24, oxygen saturation 99% on room air.  WBC count 6.8, hemoglobin 9.7, and platelets 151.  Chemistry mostly within normal ranges except for calcium is 8.8.  Urinalysis is negative.  CT angiogram of the chest showed no evidence of PE.  Small hiatal hernia very mild atelectasis or infiltrate along the anterolateral aspect of the right upper lung.  Patient is being admitted for a  suspected pneumonia as well as sickle cell pain crisis.  Hospital Course:  Sickle cell disease with pain crisis: Patient was admitted for sickle cell pain crisis and managed appropriately with IVF, IV Dilaudid via PCA  as well as other adjunct therapies per sickle cell pain management protocols.  IV Toradol held, patient is in first trimester pregnancy.  She is followed by obstetrician.  IV Dilaudid PCA was weaned appropriately.  Patient transition to oxycodone 5 mg every 4 hours as needed.  Patient does not have home pain medications.  Oxycodone sent to patient's pharmacy.  Patient also admitted with probable pneumonia.  CT angiogram of chest showed ectasis versus infiltrate.  Patient remained afebrile and WBC count within normal range.  Patient showed no further signs of infection or inflammation.  No further antibiotics warranted. Pain intensity is 2/10.  Patient is requesting discharge home.  Advised to follow-up with obstetrician for further management of pregnancy.  Patient does not have a primary care provider, patient advised to establish care. She is alert, oriented, and ambulating without assistance.  She is afebrile and oxygen saturation is 100% on room air.  Patient was discharged home today in a hemodynamically stable condition.   Discharge Exam: Vitals:   06/21/20 2221 06/22/20 0541  BP: 111/62 124/74  Pulse: 76 85  Resp: 18 18  Temp: 98.3 F (36.8 C) 98.2 F (36.8 C)  SpO2: 99% 100%   Vitals:   06/21/20 1727 06/21/20 2221 06/21/20 2221 06/22/20 0541  BP: 115/67 111/62 111/62 124/74  Pulse:  82 80 76 85  Resp: 16 18 18 18   Temp:  98.3 F (36.8 C) 98.3 F (36.8 C) 98.2 F (36.8 C)  TempSrc:  Oral Oral Oral  SpO2: 100% 99% 99% 100%  Weight:      Height:        General appearance : Awake, alert, not in any distress. Speech Clear. Not toxic looking HEENT: Atraumatic and Normocephalic, pupils equally reactive to light and accomodation Neck: Supple, no JVD. No cervical  lymphadenopathy.  Chest: Good air entry bilaterally, no added sounds  CVS: S1 S2 regular, no murmurs.  Abdomen: Bowel sounds present, Non tender and not distended with no gaurding, rigidity or rebound. Extremities: B/L Lower Ext shows no edema, both legs are warm to touch Neurology: Awake alert, and oriented X 3, CN II-XII intact, Non focal Skin: No Rash  Discharge Instructions  Discharge Instructions    Discharge patient   Complete by: As directed    Discharge disposition: 01-Home or Self Care   Discharge patient date: 06/22/2020     Allergies as of 06/22/2020   No Known Allergies     Medication List    TAKE these medications   acetaminophen 325 MG tablet Commonly known as: TYLENOL Take 2 tablets (650 mg total) by mouth every 6 (six) hours as needed for mild pain, moderate pain, fever or headache.   folic acid 1 MG tablet Commonly known as: FOLVITE Take 1 tablet (1 mg total) by mouth daily.   ondansetron 4 MG tablet Commonly known as: Zofran Take 1 tablet (4 mg total) by mouth daily as needed for nausea or vomiting.   oxyCODONE 5 MG immediate release tablet Commonly known as: Roxicodone Take 1 tablet (5 mg total) by mouth every 4 (four) hours as needed for severe pain.       The results of significant diagnostics from this hospitalization (including imaging, microbiology, ancillary and laboratory) are listed below for reference.    Significant Diagnostic Studies: DG Chest 2 View  Result Date: 06/20/2020 CLINICAL DATA:  Chest pain EXAM: CHEST - 2 VIEW COMPARISON:  October 07, 2019 FINDINGS: The heart size and mediastinal contours are within normal limits. Both lungs are clear. The visualized skeletal structures are unremarkable. IMPRESSION: No active cardiopulmonary disease. Electronically Signed   By: October 09, 2019 M.D.   On: 06/20/2020 15:56   CT Angio Chest PE W and/or Wo Contrast  Result Date: 06/20/2020 CLINICAL DATA:  Chest pain and back pain. EXAM: CT  ANGIOGRAPHY CHEST WITH CONTRAST TECHNIQUE: Multidetector CT imaging of the chest was performed using the standard protocol during bolus administration of intravenous contrast. Multiplanar CT image reconstructions and MIPs were obtained to evaluate the vascular anatomy. CONTRAST:  72mL OMNIPAQUE IOHEXOL 350 MG/ML SOLN COMPARISON:  May 28, 2016 FINDINGS: Cardiovascular: Satisfactory opacification of the pulmonary arteries to the segmental level. No evidence of pulmonary embolism. Normal heart size. No pericardial effusion. Mediastinum/Nodes: No enlarged mediastinal, hilar, or axillary lymph nodes. Thyroid gland, trachea, and esophagus demonstrate no significant findings. Lungs/Pleura: Very mild atelectasis and/or early infiltrate is seen along the anterolateral aspect of the right upper lobe. Very mild atelectasis is also seen within the posterior aspect of the left lung base. There is no evidence of a pleural effusion or pneumothorax. Upper Abdomen: There is a small hiatal hernia. Musculoskeletal: No chest wall abnormality. No acute or significant osseous findings. Review of the MIP images confirms the above findings. IMPRESSION: 1. No CT evidence of pulmonary embolism. 2. Very mild atelectasis  and/or early infiltrate along the anterolateral aspect of the right upper lobe. 3. Small hiatal hernia. Electronically Signed   By: Aram Candela M.D.   On: 06/20/2020 20:12    Microbiology: Recent Results (from the past 240 hour(s))  SARS Coronavirus 2 by RT PCR (hospital order, performed in [facility] hospital lab) Nasopharyngeal Nasopharyngeal Swab     Status: None   Collection Time: 06/20/20  9:47 PM   Specimen: Nasopharyngeal Swab  Result Value Ref Range Status   SARS Coronavirus 2 NEGATIVE NEGATIVE Final    Comment: (NOTE) SARS-CoV-2 target nucleic acids are NOT DETECTED.  The SARS-CoV-2 RNA is generally detectable in upper and lower respiratory specimens during the acute phase of infection. The  lowest concentration of SARS-CoV-2 viral copies this assay can detect is 250 copies / mL. A negative result does not preclude SARS-CoV-2 infection and should not be used as the sole basis for treatment or other patient management decisions.  A negative result may occur with improper specimen collection / handling, submission of specimen other than nasopharyngeal swab, presence of viral mutation(s) within the areas targeted by this assay, and inadequate number of viral copies (<250 copies / mL). A negative result must be combined with clinical observations, patient history, and epidemiological information.  Fact Sheet for Patients:   BoilerBrush.com.cy  Fact Sheet for Healthcare Providers: https://pope.com/  This test is not yet approved or  cleared by the [place] FDA and has been authorized for detection and/or diagnosis of SARS-CoV-2 by FDA under an Emergency Use Authorization (EUA).  This EUA will remain in effect (meaning this test can be used) for the duration of the COVID-19 declaration under Section 564(b)(1) of the Act, 21 U.S.C. section 360bbb-3(b)(1), unless the authorization is terminated or revoked sooner.  Performed at [facility], [address] [place], [place] [address]      Labs: Basic Metabolic Panel: Recent Labs  Lab 06/20/20 1542 06/21/20 0615  NA 135 135  K 3.6 3.8  CL 104 105  CO2 23 25  GLUCOSE 125* 99  BUN 10 7  CREATININE 0.47 0.41*  CALCIUM 8.8* 8.4*   Liver Function Tests: Recent Labs  Lab 06/21/20 0615  AST 27  ALT 26  ALKPHOS 41  BILITOT 1.0  PROT 6.9  ALBUMIN 3.3*   No results for input(s): LIPASE, AMYLASE in the last 168 hours. No results for input(s): AMMONIA in the last 168 hours. CBC: Recent Labs  Lab 06/20/20 1542 06/21/20 0615  WBC 6.8 6.9  NEUTROABS  --  4.7  HGB 9.7* 9.3*  HCT 28.6* 26.1*  MCV 70.6* 69.8*  PLT 151 134*   Cardiac  Enzymes: No results for input(s): CKTOTAL, CKMB, CKMBINDEX, TROPONINI in the last 168 hours. BNP: Invalid input(s): POCBNP CBG: No results for input(s): GLUCAP in the last 168 hours.  Time coordinating discharge: 35 minutes  Signed:  Nolon Nations  APRN, MSN, FNP-C Patient Care [facility] [address] [place], [place] [address] [phone]  [facility] 06/23/2020, 3:05 PM

## 2020-06-22 NOTE — Telephone Encounter (Signed)
Attempted another call for virtual visit LVM for pt to c/b

## 2020-06-22 NOTE — Telephone Encounter (Signed)
Attempting to reach pt for virtual NOB intake visit LVM for pt to c/b

## 2020-06-23 ENCOUNTER — Telehealth: Payer: Self-pay | Admitting: *Deleted

## 2020-06-23 NOTE — Telephone Encounter (Signed)
Attempted to contact pt to complete transition of care assessment; left message on voicemail.  Katrice Gonzalo Waymire, RN, BSN, CCRN [facility] [phone]  

## 2020-06-25 ENCOUNTER — Telehealth: Payer: Self-pay | Admitting: *Deleted

## 2020-06-25 NOTE — Telephone Encounter (Signed)
Attempted to contact pt to complete transition of care assessment; left message on voicemail.  Katrice Shyler Holzman, RN, BSN, CCRN [facility] [phone]  

## 2020-06-28 DIAGNOSIS — O0990 Supervision of high risk pregnancy, unspecified, unspecified trimester: Secondary | ICD-10-CM | POA: Insufficient documentation

## 2020-06-29 ENCOUNTER — Other Ambulatory Visit: Payer: Self-pay

## 2020-06-29 ENCOUNTER — Other Ambulatory Visit (HOSPITAL_COMMUNITY)
Admission: RE | Admit: 2020-06-29 | Discharge: 2020-06-29 | Disposition: A | Discharge status: Home / Self Care | Payer: Medicaid Other | Source: Ambulatory Visit

## 2020-06-29 ENCOUNTER — Ambulatory Visit (INDEPENDENT_AMBULATORY_CARE_PROVIDER_SITE_OTHER): Payer: Medicaid Other

## 2020-06-29 VITALS — BP 114/76 | HR 109 | Wt 266.0 lb

## 2020-06-29 DIAGNOSIS — O99019 Anemia complicating pregnancy, unspecified trimester: Secondary | ICD-10-CM

## 2020-06-29 DIAGNOSIS — Z3A11 11 weeks gestation of pregnancy: Secondary | ICD-10-CM | POA: Diagnosis not present

## 2020-06-29 DIAGNOSIS — O0990 Supervision of high risk pregnancy, unspecified, unspecified trimester: Secondary | ICD-10-CM | POA: Diagnosis not present

## 2020-06-29 DIAGNOSIS — O99210 Obesity complicating pregnancy, unspecified trimester: Secondary | ICD-10-CM | POA: Insufficient documentation

## 2020-06-29 DIAGNOSIS — Z124 Encounter for screening for malignant neoplasm of cervix: Secondary | ICD-10-CM | POA: Insufficient documentation

## 2020-06-29 DIAGNOSIS — O219 Vomiting of pregnancy, unspecified: Secondary | ICD-10-CM

## 2020-06-29 DIAGNOSIS — O23591 Infection of other part of genital tract in pregnancy, first trimester: Secondary | ICD-10-CM

## 2020-06-29 DIAGNOSIS — Z3481 Encounter for supervision of other normal pregnancy, first trimester: Secondary | ICD-10-CM | POA: Diagnosis not present

## 2020-06-29 DIAGNOSIS — O99211 Obesity complicating pregnancy, first trimester: Secondary | ICD-10-CM

## 2020-06-29 DIAGNOSIS — D57219 Sickle-cell/Hb-C disease with crisis, unspecified: Secondary | ICD-10-CM

## 2020-06-29 DIAGNOSIS — O0991 Supervision of high risk pregnancy, unspecified, first trimester: Secondary | ICD-10-CM

## 2020-06-29 DIAGNOSIS — D571 Sickle-cell disease without crisis: Secondary | ICD-10-CM

## 2020-06-29 DIAGNOSIS — A5901 Trichomonal vulvovaginitis: Secondary | ICD-10-CM

## 2020-06-29 MED ORDER — ASPIRIN 81 MG PO CHEW: 81.0000 mg | CHEWABLE_TABLET | Freq: Every day | ORAL | 3 refills | Status: DC

## 2020-06-29 MED ORDER — BLOOD PRESSURE KIT DEVI
1.0000 | 0 refills | Status: DC
Start: 2020-06-29 — End: 2020-09-21

## 2020-06-29 MED ORDER — DOXYLAMINE-PYRIDOXINE 10-10 MG PO TBEC: 2.0000 | DELAYED_RELEASE_TABLET | Freq: Every day | ORAL | 4 refills | Status: DC

## 2020-06-29 NOTE — Telephone Encounter (Signed)
Pt called back; attempted to complete transition of care assessment; unable to hear patient due to poor phone connection; called pt back; left message on voicemail x 2. Unable to complete assessment at this time.  Transition Care Management Follow-up Telephone Call   [facility] Managed Care Transition Call Status:MM [facility] Call Made   Date of discharge and from where:[facility], 06/22/20   How have you been since you were released from the hospital? "feel good"   Any questions or concerns?

## 2020-06-29 NOTE — Progress Notes (Signed)
New OB, reports no complaints today. 

## 2020-06-29 NOTE — Progress Notes (Signed)
Subjective:   Nicole Case is a 19 y.o. G1P0 at [redacted]w[redacted]d by [employer] being seen today for her first obstetrical visit.  Patient reports pregnancy is desired, but was not planned.  Patient states she was not on birth control at time of conception.   Gynecological/Obstetrical History: Her obstetrical history is significant for obesity. Patient does not intend to breast feed. Pregnancy history fully reviewed. Patient reports backache that has been ongoing for about 2 weeks.  She states she has not attempted to treat her pain outside of the hospital. Patient rates the pain a 7-8/10. Sexual Activity and Vaginal Concerns: No vaginal concerns or pain/discomfort with sexual activity.  Medical History/ROS: Sickle Cell Disease with crisis one week ago. Patient reports she has crisis very infrequently noting last incident was 7-8 months ago and she thinks she has about 3 per year. Patient denies other health issues.    Patient denies issues with urination, constipation, or diarrhea.  Patient endorses nausea/vomiting and reports last incident was this morning. Patient states she has nausea medication, but it is not working. Last dose yesterday.    Social History: Patient denies past or current usage of tobacco, alcohol, or drugs.  Patient then admits to smoking marijuana (joints) once every 2.5 weeks. Patient reports the FOB is Timoteo Ace who is involved and support.  Patient reports that she lives with FOB and endorses safety at home.  Patient denies DV/A. Patient is currently employed at [employer] part-time.  Patient is not in school.  HISTORY: OB History  Gravida Para Term Preterm AB Living  1 0 0 0 0 0  SAB TAB Ectopic Multiple Live Births  0 0 0 0 0    # Outcome Date GA Lbr Len/2nd Weight Sex Delivery Anes PTL Lv  1 Current             No pap smear history due to age.  Pap smear collected today.   Past Medical History:  Diagnosis Date  . Anxiety   . Sickle cell anemia (HCC)   .  Sickle cell anemia (HCC)    Past Surgical History:  Procedure Laterality Date  . NO PAST SURGERIES     Family History  Problem Relation Age of Onset  . Sickle cell anemia Brother    Social History   Tobacco Use  . Smoking status: Never Smoker  . Smokeless tobacco: Never Used  Vaping Use  . Vaping Use: Never used  Substance Use Topics  . Alcohol use: No  . Drug use: Not Currently    Types: Marijuana   No Known Allergies Current Outpatient Medications on File Prior to Visit  Medication Sig Dispense Refill  . acetaminophen (TYLENOL) 325 MG tablet Take 2 tablets (650 mg total) by mouth every 6 (six) hours as needed for mild pain, moderate pain, fever or headache. (Patient not taking: Reported on 05/10/2020) 30 tablet 0  . folic acid (FOLVITE) 1 MG tablet Take 1 tablet (1 mg total) by mouth daily. (Patient not taking: Reported on 05/10/2020) 30 tablet 3  . ondansetron (ZOFRAN) 4 MG tablet Take 1 tablet (4 mg total) by mouth daily as needed for nausea or vomiting. (Patient not taking: Reported on 06/29/2020) 30 tablet 0  . oxyCODONE (ROXICODONE) 5 MG immediate release tablet Take 1 tablet (5 mg total) by mouth every 4 (four) hours as needed for severe pain. (Patient not taking: Reported on 06/29/2020) 30 tablet 0   No current facility-administered medications on file  prior to visit.    Review of Systems Pertinent items noted in HPI and remainder of comprehensive ROS otherwise negative.  Exam   Vitals:   06/29/20 1043  BP: 114/76  Pulse: (!) 109  Weight: (!) 266 lb (120.7 kg)      Physical Exam Constitutional:      General: She is not in acute distress.    Appearance: Normal appearance. She is obese.  Genitourinary:     Vaginal discharge and rugosity present.     No vaginal erythema, tenderness, bleeding, ulceration or atrophic mucosa.     No cervical motion tenderness, friability, erythema, bleeding, polyp or nabothian cyst.     Genitourinary Comments: Speculum  Exam: -Normal External Genitalia: Non tender, no apparent discharge at introitus.  -Vaginal Vault: Small amt thin white discharge. Questionable malodor c/w BV. CV Collected -Cervix:Pink, no lesions, cysts, or polyps.  Appears open. No active bleeding from os-Pap collected with brush and spatula -Bimanual Exam:  UTD uterine size or position d/t body habitus.  No cervical tenderness.    HENT:     Head: Normocephalic and atraumatic.  Eyes:     Conjunctiva/sclera: Conjunctivae normal.  Neck:     Thyroid: No thyroid mass or thyroid tenderness.  Cardiovascular:     Rate and Rhythm: Normal rate and regular rhythm.     Heart sounds: Normal heart sounds.  Pulmonary:     Effort: Pulmonary effort is normal. No respiratory distress.     Breath sounds: Normal breath sounds.  Chest:     Comments: CBE deferred Abdominal:     General: Bowel sounds are normal.     Palpations: Abdomen is soft.     Tenderness: There is no abdominal tenderness.  Musculoskeletal:        General: No swelling. Normal range of motion.     Cervical back: Normal range of motion.  Neurological:     Mental Status: She is alert and oriented to person, place, and time.  Skin:    General: Skin is warm and dry.  Psychiatric:        Mood and Affect: Mood normal.        Behavior: Behavior normal.        Thought Content: Thought content normal.        Judgment: Judgment normal.     Assessment:   19 y.o. year old G1P0 Patient Active Problem List   Diagnosis Date Noted  . Supervision of high risk pregnancy, antepartum 06/28/2020  . Central chest pain   . Sickle cell anemia with crisis (HCC) 06/20/2020  . Sore throat 10/06/2019  . Fever 10/06/2019  . Urinary tract infection symptoms 10/06/2019  . Sickle-cell disease with pain (HCC) 08/01/2017  . Sickle cell disease, type [place], with unspecified crisis (HCC) 07/31/2017  . Sickle cell anemia with pain (HCC) 05/28/2016  . 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 Sickle cell  pain crisis (HCC) 01/29/2014  . Community acquired pneumonia 12/12/2011  . Sickle cell pain crisis 12/12/2011     Plan:  1. Supervision of high risk pregnancy, antepartum -Congratulations given and patient welcomed to practice. -Discussed usage of Babyscripts.  *Instructed to take blood pressure and record weekly into babyscripts. -Reviewed obstetrical visit schedule and how it is modified due to Covid, but that office visits are available.  *Reviewed modified prenatal visit schedule and platforms used for virtual visits.  -Anticipatory guidance for prenatal visits including labs, ultrasounds, and testing; Initial labs drawn. -Genetic Screening discussed, First trimester screen and  NIPS: ordered. -Encouraged to complete MyChart Registration for her ability to review results, send requests, and have questions addressed.  -Discussed estimated due date of Jan 15, 2021. -Ultrasound discussed; fetal anatomic survey: ordered. -Continue prenatal vitamins.  -Encouraged to seek out care at office or emergency room for urgent and/or emergent concerns. -Educated on the nature of [facility] with multiple MDs and other Advanced Practice Providers was explained to patient; also emphasized that residents, students are part of our team. Informed of her right to refuse care as she deems appropriate.  -No questions or concerns.   2. Sickle cell disease, type [place], with unspecified crisis (HCC) -Recent crisis with CAP on 7/16. -Patient reports FOB is negative disease and/or trait. -Instructed to bring in or scan documentation into mychart.  -Will see MD for maternity care. -Instructed to seek care at ER/ED for [facility] related crisis.   3. Maternal morbid obesity, antepartum (HCC)  Recommendations Arly.Keller ] Aspirin 81 mg daily after 12 weeks; discontinue after 36 weeks-Instructed to start Friday August 6th [ ]  Nutrition consult ] Weight gain 11-20 lbs for singleton and 25-35  lbs for twin pregnancy (IOM guidelines) . Higher class of obesity patients recommended to gain closer to lower limit  . Weight loss is associated with adverse outcomes 06-09-1994 ] Screen for DM with A1C or early 2 hr GTT Arly.Keller ] Baseline and surveillance labs (pulled in from [facility], refresh links as needed)  Lab Results  Component Value Date   PLT 134 (L) 06/21/2020   CREATININE 0.41 (L) 06/21/2020   AST 27 06/21/2020   ALT 26 06/21/2020    4. Screening for cervical cancer -Introduced to instruments used for pap smear. -Pap collected    Problem list reviewed and updated. Routine obstetric precautions reviewed.  Orders Placed This Encounter  Procedures  . Culture, OB Urine  . 06/23/2020 MFM OB DETAIL +14 WK    Standing Status:   Future    Standing Expiration Date:   12/30/2020    Order Specific Question:   Reason for Exam (SYMPTOM  OR DIAGNOSIS REQUIRED)    Answer:   Anatomy Exam, Obesity, Sickle Cell    Order Specific Question:   Preferred Location    Answer:   [facility]  . Genetic Screening  . CBC/D/Plt+RPR+Rh+ABO+Rub Ab...  . Hepatitis C Antibody  . Hemoglobin A1c    No follow-ups on file.     [facility], CNM 06/29/2020 11:09 AM

## 2020-06-30 LAB — CBC/D/PLT+RPR+RH+ABO+RUB AB...
Antibody Screen: NEGATIVE
Basophils Absolute: 0 10*3/uL (ref 0.0–0.2)
Basos: 0 %
EOS (ABSOLUTE): 0.1 10*3/uL (ref 0.0–0.4)
Eos: 1 %
HCV Ab: <0.1 s/co ratio (ref 0.0–0.9)
HIV Screen 4th Generation wRfx: NONREACTIVE
Hematocrit: 30.1 % — ABNORMAL LOW (ref 34.0–46.6)
Hemoglobin: 9.6 g/dL — ABNORMAL LOW (ref 11.1–15.9)
Hepatitis B Surface Ag: NEGATIVE
Immature Grans (Abs): 0 10*3/uL (ref 0.0–0.1)
Immature Granulocytes: 1 %
Lymphocytes Absolute: 1.6 10*3/uL (ref 0.7–3.1)
Lymphs: 20 %
MCH: 24.2 pg — ABNORMAL LOW (ref 26.6–33.0)
MCHC: 31.9 g/dL (ref 31.5–35.7)
MCV: 76 fL — ABNORMAL LOW (ref 79–97)
Monocytes Absolute: 0.5 10*3/uL (ref 0.1–0.9)
Monocytes: 7 %
Neutrophils Absolute: 5.8 10*3/uL (ref 1.4–7.0)
Neutrophils: 71 %
Platelets: 166 10*3/uL (ref 150–450)
RBC: 3.96 x10E6/uL (ref 3.77–5.28)
RDW: 21.4 % — ABNORMAL HIGH (ref 11.7–15.4)
RPR Ser Ql: NONREACTIVE
Rh Factor: POSITIVE
Rubella Antibodies, IGG: 2.94 index (ref >0.99)
WBC: 8 10*3/uL (ref 3.4–10.8)

## 2020-06-30 LAB — CERVICOVAGINAL ANCILLARY ONLY
Bacterial Vaginitis (gardnerella): NEGATIVE
Candida Glabrata: NEGATIVE
Candida Vaginitis: NEGATIVE
Chlamydia: NEGATIVE
Comment: NEGATIVE
Comment: NEGATIVE
Comment: NEGATIVE
Comment: NEGATIVE
Comment: NEGATIVE
Comment: NORMAL
Neisseria Gonorrhea: NEGATIVE
Trichomonas: POSITIVE — AB

## 2020-06-30 LAB — HEPATITIS C ANTIBODY: Hep C Virus Ab: <0.1 s/co ratio (ref 0.0–0.9)

## 2020-06-30 LAB — HCV INTERPRETATION

## 2020-06-30 LAB — HEMOGLOBIN A1C
Est. average glucose Bld gHb Est-mCnc: <74 mg/dL
Hgb A1c MFr Bld: <4.2 % — ABNORMAL LOW (ref 4.8–5.6)

## 2020-07-01 LAB — CULTURE, OB URINE

## 2020-07-01 LAB — URINE CULTURE, OB REFLEX

## 2020-07-03 DIAGNOSIS — D649 Anemia, unspecified: Secondary | ICD-10-CM | POA: Insufficient documentation

## 2020-07-03 DIAGNOSIS — A5901 Trichomonal vulvovaginitis: Secondary | ICD-10-CM | POA: Insufficient documentation

## 2020-07-03 MED ORDER — FERROUS SULFATE 325 (65 FE) MG PO TABS: 325.0000 mg | ORAL_TABLET | Freq: Two times a day (BID) | ORAL | 5 refills | Status: DC

## 2020-07-03 MED ORDER — METRONIDAZOLE 500 MG PO TABS: 2000.0000 mg | ORAL_TABLET | Freq: Once | ORAL | 0 refills | Status: AC

## 2020-07-03 NOTE — Addendum Note (Signed)
Addended by: Gerrit Heck L on: 07/03/2020 02:32 AM   Modules accepted: Orders

## 2020-07-03 NOTE — Addendum Note (Signed)
Addended by: Gerrit Heck L on: 07/03/2020 02:28 AM   Modules accepted: Orders

## 2020-07-05 LAB — CYTOLOGY - PAP
Comment: NEGATIVE
Comment: NEGATIVE
Diagnosis: NEGATIVE
HPV 16: NEGATIVE
HPV 18 / 45: NEGATIVE
High risk HPV: POSITIVE — AB

## 2020-07-06 DIAGNOSIS — R87810 Cervical high risk human papillomavirus (HPV) DNA test positive: Secondary | ICD-10-CM | POA: Insufficient documentation

## 2020-07-10 DIAGNOSIS — Z148 Genetic carrier of other disease: Secondary | ICD-10-CM | POA: Insufficient documentation

## 2020-08-03 ENCOUNTER — Encounter: Payer: Medicaid Other | Admitting: Obstetrics and Gynecology

## 2020-08-16 ENCOUNTER — Encounter: Payer: Medicaid Other | Admitting: Obstetrics and Gynecology

## 2020-08-23 ENCOUNTER — Encounter: Payer: Self-pay | Admitting: *Deleted

## 2020-08-23 ENCOUNTER — Ambulatory Visit: Payer: Medicaid Other | Admitting: *Deleted

## 2020-08-23 ENCOUNTER — Other Ambulatory Visit: Payer: Self-pay

## 2020-08-23 ENCOUNTER — Other Ambulatory Visit: Payer: Self-pay | Admitting: *Deleted

## 2020-08-23 ENCOUNTER — Ambulatory Visit: Payer: Medicaid Other

## 2020-08-23 DIAGNOSIS — O0990 Supervision of high risk pregnancy, unspecified, unspecified trimester: Secondary | ICD-10-CM

## 2020-08-23 DIAGNOSIS — D57219 Sickle-cell/Hb-C disease with crisis, unspecified: Secondary | ICD-10-CM | POA: Insufficient documentation

## 2020-08-23 DIAGNOSIS — D571 Sickle-cell disease without crisis: Secondary | ICD-10-CM

## 2020-08-23 DIAGNOSIS — O99210 Obesity complicating pregnancy, unspecified trimester: Secondary | ICD-10-CM | POA: Insufficient documentation

## 2020-08-24 ENCOUNTER — Encounter: Payer: Medicaid Other | Admitting: Obstetrics and Gynecology

## 2020-09-02 ENCOUNTER — Encounter: Payer: Medicaid Other | Admitting: Obstetrics & Gynecology

## 2020-09-08 ENCOUNTER — Emergency Department (HOSPITAL_COMMUNITY): Payer: Medicaid Other

## 2020-09-08 ENCOUNTER — Encounter (HOSPITAL_COMMUNITY): Payer: Self-pay

## 2020-09-08 ENCOUNTER — Other Ambulatory Visit: Payer: Self-pay

## 2020-09-08 ENCOUNTER — Emergency Department (HOSPITAL_COMMUNITY)
Admission: EM | Admit: 2020-09-08 | Discharge: 2020-09-08 | Disposition: A | Discharge status: Home / Self Care | Payer: Medicaid Other | Attending: Emergency Medicine | Admitting: Emergency Medicine

## 2020-09-08 DIAGNOSIS — Z7982 Long term (current) use of aspirin: Secondary | ICD-10-CM | POA: Insufficient documentation

## 2020-09-08 DIAGNOSIS — R000 Tachycardia, unspecified: Secondary | ICD-10-CM | POA: Diagnosis not present

## 2020-09-08 DIAGNOSIS — M791 Myalgia, unspecified site: Secondary | ICD-10-CM | POA: Diagnosis not present

## 2020-09-08 DIAGNOSIS — M545 Low back pain, unspecified: Secondary | ICD-10-CM | POA: Insufficient documentation

## 2020-09-08 DIAGNOSIS — M546 Pain in thoracic spine: Secondary | ICD-10-CM | POA: Insufficient documentation

## 2020-09-08 DIAGNOSIS — Z20822 Contact with and (suspected) exposure to covid-19: Secondary | ICD-10-CM | POA: Diagnosis not present

## 2020-09-08 DIAGNOSIS — N939 Abnormal uterine and vaginal bleeding, unspecified: Secondary | ICD-10-CM

## 2020-09-08 DIAGNOSIS — O26892 Other specified pregnancy related conditions, second trimester: Secondary | ICD-10-CM | POA: Diagnosis present

## 2020-09-08 DIAGNOSIS — O208 Other hemorrhage in early pregnancy: Secondary | ICD-10-CM | POA: Diagnosis not present

## 2020-09-08 DIAGNOSIS — O26852 Spotting complicating pregnancy, second trimester: Secondary | ICD-10-CM | POA: Diagnosis not present

## 2020-09-08 DIAGNOSIS — R059 Cough, unspecified: Secondary | ICD-10-CM | POA: Diagnosis not present

## 2020-09-08 DIAGNOSIS — O99891 Other specified diseases and conditions complicating pregnancy: Secondary | ICD-10-CM | POA: Diagnosis not present

## 2020-09-08 DIAGNOSIS — Z3492 Encounter for supervision of normal pregnancy, unspecified, second trimester: Secondary | ICD-10-CM

## 2020-09-08 DIAGNOSIS — Z3A21 21 weeks gestation of pregnancy: Secondary | ICD-10-CM | POA: Insufficient documentation

## 2020-09-08 DIAGNOSIS — R079 Chest pain, unspecified: Secondary | ICD-10-CM | POA: Diagnosis not present

## 2020-09-08 LAB — COMPREHENSIVE METABOLIC PANEL
ALT: 14 U/L (ref 0–44)
AST: 18 U/L (ref 15–41)
Albumin: 3.3 g/dL — ABNORMAL LOW (ref 3.5–5.0)
Alkaline Phosphatase: 68 U/L (ref 38–126)
Anion gap: 9 (ref 5–15)
BUN: <5 mg/dL — ABNORMAL LOW (ref 6–20)
CO2: 22 mmol/L (ref 22–32)
Calcium: 8.9 mg/dL (ref 8.9–10.3)
Chloride: 104 mmol/L (ref 98–111)
Creatinine, Ser: 0.5 mg/dL (ref 0.44–1.00)
GFR calc non Af Amer: >60 mL/min (ref >60)
Glucose, Bld: 105 mg/dL — ABNORMAL HIGH (ref 70–99)
Potassium: 3.5 mmol/L (ref 3.5–5.1)
Sodium: 135 mmol/L (ref 135–145)
Total Bilirubin: 1.3 mg/dL — ABNORMAL HIGH (ref 0.3–1.2)
Total Protein: 7.2 g/dL (ref 6.5–8.1)

## 2020-09-08 LAB — URINALYSIS, ROUTINE W REFLEX MICROSCOPIC
Bilirubin Urine: NEGATIVE
Glucose, UA: NEGATIVE mg/dL
Hgb urine dipstick: NEGATIVE
Ketones, ur: NEGATIVE mg/dL
Nitrite: NEGATIVE
Protein, ur: NEGATIVE mg/dL
Specific Gravity, Urine: 1.001 — ABNORMAL LOW (ref 1.005–1.030)
pH: 6 (ref 5.0–8.0)

## 2020-09-08 LAB — CBC WITH DIFFERENTIAL/PLATELET
Abs Immature Granulocytes: 0.06 10*3/uL (ref 0.00–0.07)
Basophils Absolute: 0 10*3/uL (ref 0.0–0.1)
Basophils Relative: 0 %
Eosinophils Absolute: 0.1 10*3/uL (ref 0.0–0.5)
Eosinophils Relative: 1 %
HCT: 27.3 % — ABNORMAL LOW (ref 36.0–46.0)
Hemoglobin: 9.5 g/dL — ABNORMAL LOW (ref 12.0–15.0)
Immature Granulocytes: 1 %
Lymphocytes Relative: 24 %
Lymphs Abs: 2 10*3/uL (ref 0.7–4.0)
MCH: 24.4 pg — ABNORMAL LOW (ref 26.0–34.0)
MCHC: 34.8 g/dL (ref 30.0–36.0)
MCV: 70.2 fL — ABNORMAL LOW (ref 80.0–100.0)
Monocytes Absolute: 0.7 10*3/uL (ref 0.1–1.0)
Monocytes Relative: 8 %
Neutro Abs: 5.5 10*3/uL (ref 1.7–7.7)
Neutrophils Relative %: 66 %
Platelets: 175 10*3/uL (ref 150–400)
RBC: 3.89 MIL/uL (ref 3.87–5.11)
RDW: 21.3 % — ABNORMAL HIGH (ref 11.5–15.5)
WBC: 8.3 10*3/uL (ref 4.0–10.5)
nRBC: 0 % (ref 0.0–0.2)

## 2020-09-08 LAB — RETICULOCYTES
Immature Retic Fract: 27.4 % — ABNORMAL HIGH (ref 2.3–15.9)
RBC.: 3.87 MIL/uL (ref 3.87–5.11)
Retic Count, Absolute: 146.3 10*3/uL (ref 19.0–186.0)
Retic Ct Pct: 3.8 % — ABNORMAL HIGH (ref 0.4–3.1)

## 2020-09-08 LAB — I-STAT BETA HCG BLOOD, ED (MC, WL, AP ONLY): I-stat hCG, quantitative: >2000 m[IU]/mL — ABNORMAL HIGH (ref <5)

## 2020-09-08 LAB — WET PREP, GENITAL
Clue Cells Wet Prep HPF POC: NONE SEEN
Sperm: NONE SEEN
Trich, Wet Prep: NONE SEEN
WBC, Wet Prep HPF POC: NONE SEEN
Yeast Wet Prep HPF POC: NONE SEEN

## 2020-09-08 LAB — RESPIRATORY PANEL BY RT PCR (FLU A&B, COVID)
Influenza A by PCR: NEGATIVE
Influenza B by PCR: NEGATIVE
SARS Coronavirus 2 by RT PCR: NEGATIVE

## 2020-09-08 MED ORDER — HYDROMORPHONE HCL 1 MG/ML IJ SOLN: 1.0000 mg | INTRAMUSCULAR | Status: AC

## 2020-09-08 MED ORDER — ACETAMINOPHEN 500 MG PO TABS
1000.0000 mg | ORAL_TABLET | Freq: Once | ORAL | Status: AC
  Administered 2020-09-08: 1000 mg via ORAL
  Filled 2020-09-08: qty 2

## 2020-09-08 MED ORDER — OXYCODONE-ACETAMINOPHEN 5-325 MG PO TABS
2.0000 | ORAL_TABLET | Freq: Once | ORAL | Status: AC
  Administered 2020-09-08: 2 via ORAL
  Filled 2020-09-08: qty 2

## 2020-09-08 MED ORDER — HYDROMORPHONE HCL 1 MG/ML IJ SOLN
0.5000 mg | INTRAMUSCULAR | Status: AC
  Administered 2020-09-08: 0.5 mg via INTRAVENOUS
  Filled 2020-09-08: qty 1

## 2020-09-08 MED ORDER — DEXTROSE-NACL 5-0.45 % IV SOLN: INTRAVENOUS | Status: DC

## 2020-09-08 MED ORDER — AMOXICILLIN-POT CLAVULANATE 875-125 MG PO TABS: 1.0000 | ORAL_TABLET | Freq: Two times a day (BID) | ORAL | 0 refills | Status: DC

## 2020-09-08 NOTE — ED Notes (Signed)
Assumed care of patient at this time, nad noted, sr up x2, bed locked and low, call bell w/I reach.  Will continue to monitor. ° °

## 2020-09-08 NOTE — ED Notes (Signed)
Patient ambulatory to and from the bathroom with a steady gait.  

## 2020-09-08 NOTE — ED Triage Notes (Addendum)
Pt arrived via walk in, c/o back pain. States pain has been there since she developed pneumonia in July, never resolved after discharge.  Pt states she is approx 5 months pregnant.  No issues with pregnancy, pt states she cont feeling baby move. No issue.

## 2020-09-08 NOTE — Discharge Instructions (Addendum)
You were seen in the ER for back pain.  You reported a cough with yellow phlegm for 1 month.  Unfortunately, you had one episode of light pink discharge here in the ER  Your chest x-ray showed a possible and early infiltrate in your right middle lung.  Compared to other imaging this has been noted in the past.  Since you reported cough, we will treat you for a possible pneumonia although you do not have fever, chest pain, shortness of breath and your white count is normal.  Take Augmentin twice a day for the next 7 days.  Return immediately for worsening cough, chest pain, shortness of breath, fever.  COVID, influenza A and B tests were negative today   Your back pain improved after 1 dose of Dilaudid here.  Please contact sickle cell clinic to establish care and continue sickle cell management and chronic pain during her pregnancy.  In the meantime you can take 500 to 1000 mg of acetaminophen every 6-8 hours as needed for pain.  We performed an abdominal ultrasound today and the fetus looked okay, heart rate was 154 and there were no obvious areas of bleeding.  Please contact your OB/GYN in the next 24 to 48 hours for reevaluation.  Your blood type is a plus and you did not need any further treatment.  Return to the ER for heavier vaginal bleeding, pelvic cramping or contractions, loss of fluid, abnormal fetus movement

## 2020-09-08 NOTE — ED Provider Notes (Signed)
[address] DEPT Provider Note   CSN: [id number] Arrival date & time: 09/08/20  1421     History Chief Complaint  Patient presents with  . Back Pain    Nicole Case is a 19 y.o. female with history of sickle cell anemia, chronic pain, currently approximately 21 weeks 4 days pregnant presents to ER for evaluation of back pain. Onset 4 days ago. 10/10, constant. Pain in her back is worse with deep breaths, sometimes but not always. Not worse with movement, palpation. Has not tried anything for pain at home. Not taking any pain medicines due to pregnancy.  Pain is down the middle of thoracic back and radiates down lower back but also states it is "everywhere" in her back.  Pain is constant. No recent falls. No recent heavy lifting. Denies saddle anesthesia, numbness or weakness in her extremities. No urinary symptoms. Typically her sickle cell pain is in her arms and legs. She is also having pain in arms and legs today that is typical of her sickle cell pain crisis.  States she has similar back pain in July when she was admitted to the hospital. She was told she had pneumonia at that time. Was treated with dilaudid and morphine during admission for pain.  Reports worsening acid reflux due to pregnancy with occasional vomiting in the morning.  Reports productive cough with yellow sputum for 1 month. No fever, nasal congestion, sore throat, CP, SOB. Not vaccinated for COVID.  Works at [employer]. Negative COVID test 1 week ago.  Denies tobacco or marijuana use. Denies prolong immobilization, previous blood clots, hemoptysis, leg swelling, calf pain, recent surgeries .  Has been feeling baby move "flutters". No vaginal bleeding or LOF, UTI symptoms. No OB complications.   HPI     Past Medical History:  Diagnosis Date  . Anxiety   . Sickle cell anemia (HCC)   . Sickle cell anemia [facility])     Patient Active Problem List   Diagnosis Date Noted  . 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 Carrier of genetic disorder 07/10/2020  . 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 Trichomonal vaginitis during pregnancy in first trimester 07/03/2020  . Sickle cell anemia of mother during pregnancy ([place]) 07/03/2020  . Maternal morbid obesity, antepartum ([place]) 06/29/2020  . Supervision of high risk pregnancy, antepartum 06/28/2020  . Central chest pain   . Sickle cell anemia with crisis ([place]) 06/20/2020  . Sore throat 10/06/2019  . Fever 10/06/2019  . Urinary tract infection symptoms 10/06/2019  . Sickle-cell disease with pain ([place]) 08/01/2017  . Sickle cell disease, type [place], with unspecified crisis ([place]) 07/31/2017  . Sickle cell anemia with pain ([place]) 05/28/2016  . 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 Sickle cell pain crisis ([place]) 01/29/2014  . Community acquired pneumonia 12/12/2011  . Sickle cell pain crisis 12/12/2011    Past Surgical History:  Procedure Laterality Date  . NO PAST SURGERIES       OB History    Gravida  1   Para      Term      Preterm      AB      Living        SAB      TAB      Ectopic      Multiple      Live Births              Family History  Problem Relation Age of Onset  . Sickle cell anemia Brother     Social History   Tobacco Use  . Smoking status: Never Smoker  . Smokeless tobacco: Never Used  Vaping Use  . Vaping Use: Never used  Substance Use Topics  . Alcohol use: No  . Drug use: Not Currently    Types: Marijuana    Home Medications Prior to Admission medications   Medication Sig Start Date End Date Taking? Authorizing Provider  aspirin 81 MG chewable tablet Chew 1 tablet (81 mg total) by mouth daily. 06/29/20  Yes [name], CNM  Doxylamine-Pyridoxine (DICLEGIS) 10-10 MG TBEC Take 2 tablets by mouth at bedtime. Patient taking differently: Take 1 tablet by mouth daily as needed (morning sickness).  06/29/20  Yes [name], CNM  folic acid (FOLVITE) 1  MG tablet Take 1 tablet (1 mg total) by mouth daily. 10/08/19 10/07/20 Yes [name], FNP  oxyCODONE (ROXICODONE) 5 MG immediate release tablet Take 1 tablet (5 mg total) by mouth every 4 (four) hours as needed for severe pain. 06/22/20  Yes [name], FNP  Prenatal Vit-Fe Fumarate-FA (PRENATAL VITAMIN PO) Take 2 tablets by mouth daily.    Yes [provider]  acetaminophen (TYLENOL) 325 MG tablet Take 2 tablets (650 mg total) by mouth every 6 (six) hours as needed for mild pain, moderate pain, fever or headache. Patient not taking: Reported on 05/10/2020 10/08/19   [name], FNP  amoxicillin-clavulanate (AUGMENTIN) 875-125 MG tablet Take 1 tablet by mouth every 12 (twelve) hours. 09/08/20   Kinnie Feil, PA-C  Blood Pressure Monitoring (BLOOD PRESSURE KIT) DEVI 1 kit by Does not apply route once a week. Check Blood Pressure regularly and record readings into the [employer] App.  Large Cuff.  DX O90.0 06/29/20   [name], CNM  ferrous sulfate 325 (65 FE) MG tablet Take 1 tablet (325 mg total) by mouth 2 (two) times daily with a meal. Patient not taking: Reported on 09/08/2020 07/03/20   [name], CNM  ondansetron (ZOFRAN) 4 MG tablet Take 1 tablet (4 mg total) by mouth daily as needed for nausea or vomiting. Patient not taking: Reported on 06/29/2020 06/22/20 06/22/21  [name], FNP    Allergies    Patient has no known allergies.  Review of Systems   Review of Systems  Musculoskeletal: Positive for back pain and myalgias.  All other systems reviewed and are negative.   Physical Exam Updated Vital Signs BP 119/71   Pulse (!) 118   Temp 98.7 F (37.1 C) (Oral)   Resp 15   LMP 04/10/2020 (Exact Date)   SpO2 100%   Physical Exam Vitals and nursing note reviewed.  Constitutional:      Appearance: She is well-developed.     Comments: Non toxic in NAD  HENT:     Head: Normocephalic and atraumatic.     Nose: Nose normal.  Eyes:      Conjunctiva/sclera: Conjunctivae normal.  Cardiovascular:     Rate and Rhythm: Normal rate and regular rhythm.  Pulmonary:     Effort: Pulmonary effort is normal.     Breath sounds: Normal breath sounds.  Abdominal:     General: Bowel sounds are normal.     Palpations: Abdomen is soft.     Tenderness: There is no abdominal tenderness.     Comments: Gravid/obese abdomen, soft non tender   Genitourinary:    Comments:  Exam performed with EMT at  bedside for assistance. External genitalia without lesions.  No groin lymphadenopathy.  Vaginal mucosa and cervix pink without lesions.  Cervical os is closed without friability, bleeding. Scant likely physiologic clear/white discharge in vaginal vault noted.  No CMT.  Nonpalpable, nontender adnexa.  Perianal skin normal without lesions. Musculoskeletal:        General: Normal range of motion.     Cervical back: Normal range of motion.     Comments: No midline or paraspinal CTL spine tenderness. Normal skin   Skin:    General: Skin is warm and dry.     Capillary Refill: Capillary refill takes less than 2 seconds.  Neurological:     Mental Status: She is alert.  Psychiatric:        Behavior: Behavior normal.     ED Results / Procedures / Treatments   Labs (all labs ordered are listed, but only abnormal results are displayed) Labs Reviewed  CBC WITH DIFFERENTIAL/PLATELET - Abnormal; Notable for the following components:      Result Value   Hemoglobin 9.5 (*)    HCT 27.3 (*)    MCV 70.2 (*)    MCH 24.4 (*)    RDW 21.3 (*)    All other components within normal limits  COMPREHENSIVE METABOLIC PANEL - Abnormal; Notable for the following components:   Glucose, Bld 105 (*)    BUN <5 (*)    Albumin 3.3 (*)    Total Bilirubin 1.3 (*)    All other components within normal limits  URINALYSIS, ROUTINE W REFLEX MICROSCOPIC - Abnormal; Notable for the following components:   Color, Urine STRAW (*)    Specific Gravity, Urine 1.001 (*)     Leukocytes,Ua SMALL (*)    Bacteria, UA RARE (*)    All other components within normal limits  RETICULOCYTES - Abnormal; Notable for the following components:   Retic Ct Pct 3.8 (*)    Immature Retic Fract 27.4 (*)    All other components within normal limits  I-STAT BETA HCG BLOOD, ED ([facility], [facility], [facility] ONLY) - Abnormal; Notable for the following components:   I-stat hCG, quantitative >2,000.0 (*)    All other components within normal limits  RESPIRATORY PANEL BY RT PCR (FLU A&B, COVID)  WET PREP, GENITAL  URINE CULTURE  GC/CHLAMYDIA PROBE AMP ([place]) NOT AT [facility]  WET PREP  (BD AFFIRM) ([place])    EKG EKG Interpretation  Date/Time:  Wednesday September 08 2020 17:53:58 EDT Ventricular Rate:  104 PR Interval:    QRS Duration: 74 QT Interval:  323 QTC Calculation: 425 R Axis:   61 Text Interpretation: Sinus tachycardia 12 Lead; [place] Confirmed by Pickering, Nathan ([id number]) on 09/08/2020 8:53:12 PM   Radiology DG Chest Port 1 View  Result Date: 09/08/2020 CLINICAL DATA:  Pain and productive cough EXAM: PORTABLE CHEST 1 VIEW COMPARISON:  06/20/2020 FINDINGS: Cardiac shadow is within normal limits. The lungs are well aerated bilaterally. Patchy airspace opacity is noted in the right mid lung. No other focal infiltrate is seen. No effusions are seen. No bony abnormality is noted. IMPRESSION: Patchy right-sided opacity in the mid lung consistent with early infiltrate. Electronically Signed   By: Mark  Lukens M.D.   On: 09/08/2020 17:10    Procedures Ultrasound ED OB Pelvic  Date/Time: 09/08/2020 11:07 PM Performed by: Lorretta Kerce J, PA-C Authorized by: Jackline Castilla J, PA-C   Procedure details:    Indications: pregnant with vaginal bleeding     Assess:  EGA   and fetal viability   Technique:  Transabdominal obstetric (HCG+) exam   Images: archived   Study Limitations: body habitus Uterine findings:    Intrauterine pregnancy: identified     Single gestation:  identified     Fetal heart rate: identified (154)     Estimated fetal age (weeks): 21 w 6d. Left ovary findings:    Left ovary:  Not visualized    Right ovary findings:     Right ovary:  Not visualized    Other findings:    Free pelvic fluid: not identified     (including critical care time)  Medications Ordered in ED Medications  dextrose 5 %-0.45 % sodium chloride infusion ( Intravenous Stopped 09/08/20 2208)  HYDROmorphone (DILAUDID) injection 1 mg (1 mg Intravenous Not Given 09/08/20 1927)  acetaminophen (TYLENOL) tablet 1,000 mg (1,000 mg Oral Given 09/08/20 1703)  HYDROmorphone (DILAUDID) injection 0.5 mg (0.5 mg Intravenous Given 09/08/20 1828)  oxyCODONE-acetaminophen (PERCOCET/ROXICET) 5-325 MG per tablet 2 tablet (2 tablets Oral Given 09/08/20 2053)    ED Course  I have reviewed the triage vital signs and the nursing notes.  Pertinent labs & imaging results that were available during my care of the patient were reviewed by me and considered in my medical decision making (see chart for details).  Clinical Course as of Sep 09 2307  Wed Sep 08, 2020  1718 IMPRESSION: Patchy right-sided opacity in the mid lung consistent with early infiltrate.  DG Chest Port 1 View [CG]  1804 Retic Ct Pct(!): 3.8 [CG]  1805 Immature Retic Fract(!): 27.4 [CG]  1805 Bacteria, UA(!): RARE [CG]  1805 Leukocytes,Ua(!): SMALL [CG]  1805 Re-evaluated patient. She just vomited. Tells me back pain is better now 5/10. Wants to avoid IV pain med and do pills. Will give antiemetic and pain medicine. Tells me she urinated and with wiping noticed pink tinged discharge. Has been feeling baby move ok so far.    [CG]  2111 Patient's blood type A+ from pervious labs    [CG]    Clinical Course User Index [CG] Gibbons, Claudia J, PA-C   MDM Rules/Calculators/A&P                          EMR triage and nursing notes reviewed to assist with MDM and obtain more history  Seen 06/2020 for similar back pain,  sickle cell pain. CTA negative for PE, there was questionable early infiltrate in "anterolateral aspect of the right upper lobe" on CXR.  She reported CP, SOB and occasional cough at that time. Was admitted for possible acute chest syndrome and continued sickle cell pain in setting of pregnancy.  Discharged two days later without antibiotics as she was not exhibiting any respiratory symptoms, no leukocytosis, fever.    Patient presents today for return of similar thoracic and lumbar midline back pain. Does again endorse cough with yellow sputum for 1 month but NO CP, SOB, fever.  Unvaccinated for COVID.  Denies OB complaints.   ER work up initiated by me including sickle cell order set. Have additionally ordered CXR, EKG, COVID swab.   Discussed with patient risk of using opioid pain medicines in setting of pregnancy. She is agreeable to low doses of dilaudid. Will order 0.5 mg then 1 mg prn.  Will reassess.   ER work up personally visualized and interpreted.   CXR today shows patchy right opacity in mid lung consistent with "early infiltrate". However, no leukocytosis. Afebrile. Ambulated here   with hypoxia. Intermittently mildly tachycardic.  Respiratory panel negative.   Given reported productive cough with yellow sputum, pregnancy, SCD low threshold to treat with antibiotics. Pharmacy recommended augmentin. However, last CXR compared to today looks very similar.  Additionally no leukocytosis, fever.  Will defer admission given overall well appearing, reassuring labs but will discharge with antibiotics. Shared with EDP Pickering. Patient is comfortable with discharge.   1930: Patient re-evaluated and back pain has improved 8/10 to 4/10 after 0.5 mg dilaudid only.  She  declined further IV pain control, tolerating PO percocet, tylenol.   1055: Patient had one episode of pink tinge vaginal spotting while in the ER.  Bedside US performed by me under EDP Yao supervision revealed active fetus HR 154  without evidence of subchorionic hemorrhage meassuring approx 21 w and 6 days.  A+ blood, no need for rhogam.  Patient denies contractions, pelvic pain.  Will discharge with close OB follow up.   Return precautions given. Patient in agreement with POC.  Final Clinical Impression(s) / ED Diagnoses Final diagnoses:  Cough  Vaginal spotting  Second trimester pregnancy    Rx / DC Orders ED Discharge Orders         Ordered    amoxicillin-clavulanate (AUGMENTIN) 875-125 MG tablet  Every 12 hours        09/08/20 2147           Gibbons, Claudia J, PA-C 09/08/20 2308    Pickering, Nathan, MD 09/11/20 2342  

## 2020-09-09 ENCOUNTER — Telehealth: Payer: Self-pay

## 2020-09-09 DIAGNOSIS — Z7689 Persons encountering health services in other specified circumstances: Secondary | ICD-10-CM

## 2020-09-09 LAB — URINE CULTURE

## 2020-09-09 LAB — GC/CHLAMYDIA PROBE AMP (~~LOC~~) NOT AT ARMC
Chlamydia: NEGATIVE
Comment: NEGATIVE
Comment: NORMAL
Neisseria Gonorrhea: NEGATIVE

## 2020-09-09 NOTE — Telephone Encounter (Signed)
Transition Care Management Follow-up Telephone Call  Date of discharge and from where: [facility] 09/08/2020  How have you been since you were released from the hospital? Feeling good  Any questions or concerns? No  Items Reviewed:  Did the pt receive and understand the discharge instructions provided? Yes   Medications obtained and verified? Yes   Any new allergies since your discharge? No   Dietary orders reviewed? Yes  Do you have support at home? Yes   Functional Questionnaire: (I = Independent and D = Dependent) ADLs: I  Bathing/Dressing- I  Meal Prep- I  Eating- I  Maintaining continence- I  Transferring/Ambulation- I  Managing Meds- I  Follow up appointments reviewed:   PCP Hospital f/u appt confirmed? Yes  Scheduled to see Kelby Aline on 09/18/2020   Specialist Hospital f/u appt confirmed? No    Are transportation arrangements needed? Yes   If their condition worsens, is the pt aware to call PCP or go to the Emergency Dept.? Yes  Was the patient provided with contact information for the PCP's office or ED? Yes  Was to pt encouraged to call back with questions or concerns? Yes

## 2020-09-10 ENCOUNTER — Other Ambulatory Visit: Payer: Self-pay

## 2020-09-10 NOTE — Patient Outreach (Signed)
Care Coordination  09/10/2020  Nicole Case Jan 12, 2001 [id number]   An unsuccessful telephone outreach was attempted today. The patient was referred to the case management team for assistance with care management and care coordination.   Follow Up Plan: A HIPAA compliant phone message was left for the patient providing contact information and requesting a return call.  The Managed Medicaid care management team will reach out to the patient again over the next 7 days.   Estanislado Emms RN, BSN [place]  [facility]

## 2020-09-10 NOTE — Patient Instructions (Signed)
Visit Information  [name] Case  - as a part of your Medicaid benefit, you are eligible for care management and care coordination services at no cost or copay. I was unable to reach you by phone today but would be happy to help you with your health related needs. Please feel free to call me @ [phone].   A member of the Managed Medicaid care management team will reach out to you again over the next 7 days.   Loudon Krakow RN, BSN [place]  [facility] RN Care Coordinator   

## 2020-09-13 ENCOUNTER — Encounter: Payer: Medicaid Other | Admitting: Obstetrics and Gynecology

## 2020-09-13 ENCOUNTER — Other Ambulatory Visit: Payer: Self-pay

## 2020-09-13 ENCOUNTER — Ambulatory Visit: Payer: Self-pay

## 2020-09-13 ENCOUNTER — Other Ambulatory Visit: Payer: Self-pay | Admitting: *Deleted

## 2020-09-13 NOTE — Patient Instructions (Signed)
Visit Information  Ms. Thien was given information about Medicaid Managed Care team care coordination services as a part of their Healthy [facility] Medicaid benefit. Meredith Kilbride Klingberg verbally consented to engagement with the [facility] Managed Care team.   For questions related to your Healthy [facility] health plan, please call: [phone] or visit the homepage here: [email or web address]   Second Trimester of Pregnancy  The second trimester is from week 14 through week 27 (month 4 through 6). This is often the time in pregnancy that you feel your best. Often times, morning sickness has lessened or quit. You may have more energy, and you may get hungry more often. Your unborn baby is growing rapidly. At the end of the sixth month, he or she is about 9 inches long and weighs about 1 pounds. You will likely feel the baby move between 18 and 20 weeks of pregnancy. Follow these instructions at home: Medicines  Take over-the-counter and prescription medicines only as told by your doctor. Some medicines are safe and some medicines are not safe during pregnancy.  Take a prenatal vitamin that contains at least 600 micrograms (mcg) of folic acid.  If you have trouble pooping (constipation), take medicine that will make your stool soft (stool softener) if your doctor approves. Eating and drinking   Eat regular, healthy meals.  Avoid raw meat and uncooked cheese.  If you get low calcium from the food you eat, talk to your doctor about taking a daily calcium supplement.  Avoid foods that are high in fat and sugars, such as fried and sweet foods.  If you feel sick to your stomach (nauseous) or throw up (vomit): ? Eat 4 or 5 small meals a day instead of 3 large meals. ? Try eating a few soda crackers. ? Drink liquids between meals instead of during meals.  To prevent constipation: ? Eat foods that are high in fiber, like fresh fruits and vegetables, whole  grains, and beans. ? Drink enough fluids to keep your pee (urine) clear or pale yellow. Activity  Exercise only as told by your doctor. Stop exercising if you start to have cramps.  Do not exercise if it is too hot, too humid, or if you are in a place of great height (high altitude).  Avoid heavy lifting.  Wear low-heeled shoes. Sit and stand up straight.  You can continue to have sex unless your doctor tells you not to. Relieving pain and discomfort  Wear a good support bra if your breasts are tender.  Take warm water baths (sitz baths) to soothe pain or discomfort caused by hemorrhoids. Use hemorrhoid cream if your doctor approves.  Rest with your legs raised if you have leg cramps or low back pain.  If you develop puffy, bulging veins (varicose veins) in your legs: ? Wear support hose or compression stockings as told by your doctor. ? Raise (elevate) your feet for 15 minutes, 3-4 times a day. ? Limit salt in your food. Prenatal care  Write down your questions. Take them to your prenatal visits.  Keep all your prenatal visits as told by your doctor. This is important. Safety  Wear your seat belt when driving.  Make a list of emergency phone numbers, including numbers for family, friends, the hospital, and police and fire departments. General instructions  Ask your doctor about the right foods to eat or for help finding a counselor, if you need these services.  Ask your doctor about local prenatal classes. Begin classes before month  6 of your pregnancy.  Do not use hot tubs, steam rooms, or saunas.  Do not douche or use tampons or scented sanitary pads.  Do not cross your legs for long periods of time.  Visit your dentist if you have not done so. Use a soft toothbrush to brush your teeth. Floss gently.  Avoid all smoking, herbs, and alcohol. Avoid drugs that are not approved by your doctor.  Do not use any products that contain nicotine or tobacco, such as  cigarettes and e-cigarettes. If you need help quitting, ask your doctor.  Avoid cat litter boxes and soil used by cats. These carry germs that can cause birth defects in the baby and can cause a loss of your baby (miscarriage) or stillbirth. Contact a doctor if:  You have mild cramps or pressure in your lower belly.  You have pain when you pee (urinate).  You have bad smelling fluid coming from your vagina.  You continue to feel sick to your stomach (nauseous), throw up (vomit), or have watery poop (diarrhea).  You have a nagging pain in your belly area.  You feel dizzy. Get help right away if:  You have a fever.  You are leaking fluid from your vagina.  You have spotting or bleeding from your vagina.  You have severe belly cramping or pain.  You lose or gain weight rapidly.  You have trouble catching your breath and have chest pain.  You notice sudden or extreme puffiness (swelling) of your face, hands, ankles, feet, or legs.  You have not felt the baby move in over an hour.  You have severe headaches that do not go away when you take medicine.  You have trouble seeing. Summary  The second trimester is from week 14 through week 27 (months 4 through 6). This is often the time in pregnancy that you feel your best.  To take care of yourself and your unborn baby, you will need to eat healthy meals, take medicines only if your doctor tells you to do so, and do activities that are safe for you and your baby.  Call your doctor if you get sick or if you notice anything unusual about your pregnancy. Also, call your doctor if you need help with the right food to eat, or if you want to know what activities are safe for you. This information is not intended to replace advice given to you by your health care provider. Make sure you discuss any questions you have with your health care provider. Document Revised: 03/14/2019 Document Reviewed: 12/26/2016 Elsevier Patient Education   2020 [employer].   Goals Addressed            This Visit's Progress    "I need transportation to get to my doctor appointments"       CARE PLAN ENTRY Medicaid Managed Care (see longitudinal plan of care for additional care plan information)  Current Barriers:   Care Coordination needs related to prenatal care  Transportation barriers  Non-adherence to scheduled provider appointments. Patient unable to attend OB appointments due to lack of transportation  Nurse Case Manager Clinical Goal(s):   Over the next 14 days, patient will verbalize understanding of plan for prenatal care  Over the next 120 days, patient will attend all scheduled medical appointments: 09/21/20 with [facility] for OB ultrasound, 09/21/20 with [facility] for prenatal care  Over the next 120 days, the patient will demonstrate ongoing self health care management ability as evidenced by attending all scheduled  appointments  Interventions:   Inter-disciplinary care team collaboration (see longitudinal plan of care)  Evaluation of current treatment plan related to prenatal care and patient's adherence to plan as established by provider.  Advised patient to call for [employer] transportation for medical appointments 3 days before each appointment [phone])  Discussed plans with patient for ongoing care management follow up and provided patient with direct contact information for care management team  Reviewed scheduled/upcoming provider appointments including:09/21/20 in MFM and 09/21/20 at [facility]  Plan:   Patient will call and arrange transportation to medical appointments and attend all scheduled prenatal care appointments  RNCM will follow up with a telephone visit within 14 days   Initial goal documentation        Please see education materials related to pregnancy provided by MyChart link.  Patient verbalizes understanding of instructions provided today.   The patient has been provided  with contact information for the Managed Medicaid care management team and has been advised to call with any health related questions or concerns.  Telephone follow up appointment with Managed Medicaid care management team member scheduled for:09/23/20 @ 10:30am  Estanislado Emms RN, BSN [place]   [employer]

## 2020-09-13 NOTE — Patient Outreach (Signed)
Care Coordination - Case Manager  09/13/2020  Nohelia Valenza Gilbert Aug 04, 2001 [id number]  Subjective:  Nicole Case is an 19 y.o. year old female who is a primary Nicole Case of Nicole Case, No Pcp Per.  Nicole Case was given information about Medicaid Managed Care team care coordination services today. Nicole Case agreed to services and verbal consent obtained  Review of Nicole Case status, laboratory and other test data was performed as part of evaluation for provision of services.  Nicole Case was headed to work this morning. We had a short conversation today. I will reach out to her in 14 days when she will have more time for a Nicole Case outreach.Marland Kitchen SDOH: SDOH Screenings   Transportation Needs: Unmet Transportation Needs  . Lack of Transportation (Medical): Yes  . Lack of Transportation (Non-Medical): Yes   SDOH Interventions     Most Recent Value  SDOH Interventions  Transportation Interventions Other (Comment)  [Transportation information provided]      Objective:    No Known Allergies  Medications:    Medications Reviewed Today    Reviewed by Maud Deed, CPhT (Pharmacy Technician) on 09/08/20 at 2130  Med List Status: Complete  Medication Order Taking? Sig Documenting Provider Last Dose Status Informant  acetaminophen (TYLENOL) 325 MG tablet [id number] No Take 2 tablets (650 mg total) by mouth every 6 (six) hours as needed for mild pain, moderate pain, fever or headache.  Nicole Case not taking: Reported on 05/10/2020   Nicole Dew, FNP Not Taking Unknown time Consider Medication Status and Discontinue Self  aspirin 81 MG chewable tablet [id number] Yes Chew 1 tablet (81 mg total) by mouth daily. Nicole Case, CNM Past Month Unknown time Active Self  Blood Pressure Monitoring (BLOOD PRESSURE KIT) DEVI [id number]  1 kit by Does not apply route once a week. Check Blood Pressure regularly and record readings into the Babyscripts App.  Large Cuff.  DX O90.0 Nicole Case,  CNM  Active Self  Doxylamine-Pyridoxine (DICLEGIS) 10-10 MG TBEC [id number] Yes Take 2 tablets by mouth at bedtime.  Nicole Case taking differently: Take 1 tablet by mouth daily as needed (morning sickness).    Nicole Case, CNM Past Week Unknown time Active Self  ferrous sulfate 325 (65 FE) MG tablet [id number] No Take 1 tablet (325 mg total) by mouth 2 (two) times daily with a meal.  Nicole Case not taking: Reported on 09/08/2020   Nicole Case, CNM Not Taking Unknown time Consider Medication Status and Discontinue Self  folic acid (FOLVITE) 1 MG tablet [id number] Yes Take 1 tablet (1 mg total) by mouth daily. Nicole Dew, FNP Past Month Unknown time Active Self  ondansetron (ZOFRAN) 4 MG tablet [id number] No Take 1 tablet (4 mg total) by mouth daily as needed for nausea or vomiting.  Nicole Case not taking: Reported on 06/29/2020   Nicole Dew, FNP Not Taking Unknown time Consider Medication Status and Discontinue Self  oxyCODONE (ROXICODONE) 5 MG immediate release tablet [id number] Yes Take 1 tablet (5 mg total) by mouth every 4 (four) hours as needed for severe pain. Nicole Dew, FNP 08/25/2020 Active Self  Prenatal Vit-Fe Fumarate-FA (PRENATAL VITAMIN PO) [id number] Yes Take 2 tablets by mouth daily.  [provider] 09/08/2020 Unknown time Active Self          Assessment:   Goals Addressed            This Visit's Progress   . "I need transportation to get to my doctor appointments"  CARE PLAN ENTRY Medicaid Managed Care (see longitudinal plan of care for additional care plan information)  Current Barriers:  . Care Coordination needs related to prenatal care . Transportation barriers . Non-adherence to scheduled provider appointments. Nicole Case unable to attend OB appointments due to lack of transportation  Nurse Case Manager Clinical Goal(s):  Marland Kitchen Over the next 14 days, Nicole Case will verbalize understanding of plan for prenatal care . Over the next 120 days,  Nicole Case will attend all scheduled medical appointments: 09/21/20 with MFM for OB ultrasound, 09/21/20 with [facility] for prenatal care . Over the next 120 days, the Nicole Case will demonstrate ongoing self health care management ability as evidenced by attending all scheduled appointments  Interventions:  . Inter-disciplinary care team collaboration (see longitudinal plan of care) . Evaluation of current treatment plan related to prenatal care and Nicole Case's adherence to plan as established by provider. . Advised Nicole Case to call for [employer] transportation for medical appointments 3 days before each appointment [phone]) . Discussed plans with Nicole Case for ongoing care management follow up and provided Nicole Case with direct contact information for care management team . Reviewed scheduled/upcoming provider appointments including:09/21/20 in MFM and 09/21/20 at [facility]:  . Nicole Case will call and arrange transportation to medical appointments and attend all scheduled prenatal care appointments . RNCM will follow up with a telephone visit within 14 days   Initial goal documentation        Plan: [facility] telephone visit 09/23/20 @ 10:30.  Lurena Joiner RN, BSN [place]  [employer] manager

## 2020-09-14 ENCOUNTER — Telehealth: Payer: Self-pay

## 2020-09-14 ENCOUNTER — Telehealth: Payer: Self-pay | Admitting: General Practice

## 2020-09-14 NOTE — Telephone Encounter (Signed)
° °   MA10/11/2020    Name: ANANYA MCCLEESE    MRN: [id number]    DOB: [birth year]    AGE: 19 y.o.    GENDER: female    PCP Patient, No Pcp Per.   09/14/20 Left message on voicemail for patient to return my call regarding emailed information about Healthy [facility] transportation.    Zianne Schubring, AAS Paralegal, [facility] Care Guide  Embedded Care Coordination [place]   Care Management  [address] [place], [place] [address] [email or web address]   [phone]   [email or web address]

## 2020-09-14 NOTE — Telephone Encounter (Signed)
    MA10/11/2020 1st Attempt  Name: Nicole Case   MRN: [id number]   DOB: 01-Dec-2001   AGE: 19 y.o.   GENDER: female   PCP Patient, No Pcp Per.   09/14/20 Spoke with patient to inform her that I called Healthy [facility] [phone] and scheduled her transportation for 10/15. Her pick up time is by 9/25am, confirmation # [id number] and the number to call when she is ready to be  picked up is [phone] may take up to an hour.Will follow-up with patient later today to ensure she received email with transportation information.    Narada Uzzle, AAS Paralegal, [facility] Care Guide . Embedded Care Coordination [facility]  Care Management  [address] [place], [place] [address] [email or web address]  [phone]   [email or web address]

## 2020-09-14 NOTE — Telephone Encounter (Signed)
Pt called to schedule new pt appt w/ [facility] for SCD. Unable to speak w/ pt. lvm

## 2020-09-17 ENCOUNTER — Ambulatory Visit: Payer: Medicaid Other

## 2020-09-20 ENCOUNTER — Other Ambulatory Visit: Payer: Self-pay

## 2020-09-20 NOTE — Patient Instructions (Signed)
Visit Information  Nicole Case was given information about Medicaid Managed Care team care coordination services as a part of their Healthy [facility] Medicaid benefit. Nicole Case verbally consented to engagement with the [facility] Managed Care team.   For questions related to your Healthy [facility] health plan, please call: [phone] or visit the homepage here: [email or web address]  If you would like to schedule transportation through your Healthy [facility] plan, please call the following number at least 2 days in advance of your appointment: [phone]    Social Worker will follow up with patient in 14 days.Gus Puma, BSW, MHA [facility]  [place]  High Risk Managed Medicaid Team

## 2020-09-20 NOTE — Patient Outreach (Signed)
Care Coordination  09/20/2020  Nicole Case [birth year] [id number]  Referral received for care coordination for Nicole Case. The following care coordination activities have been implemented by the care team: transportation to doctors appointment on 09/17/20. has been arranged by the care guide. through [employer].. and new primary care provider appointment arranged, by care guide at [facility].Marland Kitchen  Plan: Child psychotherapist will follow up with patient in 14 days  Gus Puma, BSW, [place] [facility]  [employer] Risk Managed Medicaid Team

## 2020-09-21 ENCOUNTER — Encounter: Payer: Self-pay | Admitting: *Deleted

## 2020-09-21 ENCOUNTER — Ambulatory Visit (INDEPENDENT_AMBULATORY_CARE_PROVIDER_SITE_OTHER): Payer: Medicaid Other | Admitting: Obstetrics and Gynecology

## 2020-09-21 ENCOUNTER — Ambulatory Visit: Payer: Medicaid Other | Attending: Obstetrics and Gynecology

## 2020-09-21 ENCOUNTER — Other Ambulatory Visit: Payer: Self-pay

## 2020-09-21 ENCOUNTER — Ambulatory Visit: Payer: Medicaid Other | Admitting: *Deleted

## 2020-09-21 ENCOUNTER — Encounter: Payer: Self-pay | Admitting: Obstetrics and Gynecology

## 2020-09-21 ENCOUNTER — Other Ambulatory Visit: Payer: Self-pay | Admitting: *Deleted

## 2020-09-21 DIAGNOSIS — O0990 Supervision of high risk pregnancy, unspecified, unspecified trimester: Secondary | ICD-10-CM

## 2020-09-21 DIAGNOSIS — O99012 Anemia complicating pregnancy, second trimester: Secondary | ICD-10-CM | POA: Diagnosis not present

## 2020-09-21 DIAGNOSIS — Z3A23 23 weeks gestation of pregnancy: Secondary | ICD-10-CM | POA: Diagnosis not present

## 2020-09-21 DIAGNOSIS — D571 Sickle-cell disease without crisis: Secondary | ICD-10-CM | POA: Insufficient documentation

## 2020-09-21 DIAGNOSIS — E669 Obesity, unspecified: Secondary | ICD-10-CM | POA: Diagnosis not present

## 2020-09-21 DIAGNOSIS — Z148 Genetic carrier of other disease: Secondary | ICD-10-CM | POA: Diagnosis not present

## 2020-09-21 DIAGNOSIS — O99212 Obesity complicating pregnancy, second trimester: Secondary | ICD-10-CM | POA: Diagnosis not present

## 2020-09-21 MED ORDER — BLOOD PRESSURE KIT DEVI: 1.0000 | 0 refills | Status: DC

## 2020-09-21 NOTE — Progress Notes (Signed)
   PRENATAL VISIT NOTE  Subjective:  Nicole Case is a 19 y.o. G1P0 at [redacted]w[redacted]d being seen today for ongoing prenatal care.  She is currently monitored for the following issues for this high-risk pregnancy and has Community acquired pneumonia; Sickle cell pain crisis; Splenic sequestration; Sickle cell pain crisis ([place]); Sickle cell anemia with pain ([place]); Sickle cell disease, type House, with unspecified crisis ([place]); Sickle-cell disease with pain ([place]); Sore throat; Fever; Urinary tract infection symptoms; Sickle cell anemia with crisis ([place]); Central chest pain; Supervision of high risk pregnancy, antepartum; Maternal morbid obesity, antepartum ([place]); Trichomonal vaginitis during pregnancy in first trimester; Sickle cell anemia of mother during pregnancy [facility]); Pap 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; 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  Patient reports no complaints.   .  .   . Denies leaking of fluid.   The following portions of the patient's history were reviewed and updated as appropriate: allergies, current medications, past family history, past medical history, past social history, past surgical history and problem list.   Objective:   Vitals:   09/21/20 0953  BP: 115/75  Pulse: (!) 112  Weight: 273 lb (123.8 kg)    Fetal Status: Fetal Heart Rate (bpm): 143         General:  Alert, oriented and cooperative. Patient is in no acute distress.  Skin: Skin is warm and dry. No rash noted.   Cardiovascular: Normal heart rate noted  Respiratory: Normal respiratory effort, no problems with respiration noted  Abdomen: Soft, gravid, appropriate for gestational age.        Pelvic: Cervical exam deferred        Extremities: Normal range of motion.     Mental Status: Normal mood and affect. Normal behavior. Normal judgment and thought content.   Assessment and Plan:  Pregnancy: G1P0 at [redacted]w[redacted]d 1. Supervision of high risk pregnancy,  antepartum  - AFP, Serum, Open Spina Bifida - Blood Pressure Monitoring (BLOOD PRESSURE KIT) DEVI; 1 kit by Does not apply route once a week. Check Blood Pressure regularly and record readings into the Babyscripts App.  Large Cuff.  DX O90.0  Dispense: 1 each; Refill: 0  Preterm labor symptoms and general obstetric precautions including but not limited to vaginal bleeding, contractions, leaking of fluid and fetal movement were reviewed in detail with the patient. Please refer to After Visit Summary for other counseling recommendations.   No follow-ups on file.  Future Appointments  Date Time Provider [place]  09/23/2020 10:30 AM [facility] CCC-MM CARE MANAGER [facility] None  10/08/2020 10:00 AM [facility] CCC-MM SOCIAL WORKER 2 [facility] None  10/21/2020 12:45 PM [facility] US5 [facility] [place]    Cephas Darby, MD

## 2020-09-22 ENCOUNTER — Telehealth: Payer: Self-pay

## 2020-09-22 NOTE — Telephone Encounter (Signed)
    MA10/20/2021 2nd Attempt  Name: Nicole Case   MRN: [id number]   DOB: 10/06/2001   AGE: 19 y.o.   GENDER: female   PCP Patient, No Pcp Per.   09/22/20 2nd attempt to follow-up.  Left message on voicemail for patient to return my call regarding emailed information about Healthy [facility] transportation.    Garren Greenman, AAS Paralegal, [facility] Care Guide . Embedded Care Coordination [facility]  Care Management  [address] [place], [place] [address] [email or web address]  [phone]   [email or web address]

## 2020-09-23 ENCOUNTER — Other Ambulatory Visit: Payer: Self-pay | Admitting: *Deleted

## 2020-09-23 ENCOUNTER — Other Ambulatory Visit: Payer: Self-pay

## 2020-09-23 LAB — AFP, SERUM, OPEN SPINA BIFIDA
AFP MoM: 0.56
AFP Value: 37 ng/mL
Gest. Age on Collection Date: 23 weeks
Maternal Age At EDD: 21.6 yr
OSBR Risk 1 IN: 10000
Test Results:: NEGATIVE
Weight: 273 [lb_av]

## 2020-09-23 NOTE — Patient Instructions (Signed)
Visit Information  Thank you for taking the time to speak with me today. I tried to reconnect after our call was disconnected. I will reach out to you on 10/25/20 at 10:30 am. I will also have my team mate reach out to you to discuss housing options.  Nicole Case was given information about Medicaid Managed Care team care coordination services as a part of their Healthy [facility] Medicaid benefit. Nicole Case verbally consented to engagement with the [facility] Managed Care team.   For questions related to your Healthy [facility] health plan, please call: [phone] or visit the homepage here: [email or web address]  If you would like to schedule transportation through your Healthy [facility] plan, please call the following number at least 2 days in advance of your appointment: [phone]   Goals Addressed            This Visit's Progress    "I need transportation to get to my doctor appointments"       [place] (see longitudinal plan of care for additional care plan information)  Current Barriers:   Care Coordination needs related to prenatal care  Transportation barriers  Non-adherence to scheduled provider appointments. Patient unable to attend OB appointments due to lack of transportation. Update-Patient attended OB and MFM appt on 10/19.  Nurse Case Manager Clinical Goal(s):   Over the next 14 days, patient will verbalize understanding of plan for prenatal care-Met-Patient attended OB and MFM appointment on 10/19, has future apptointments on 11/18  Over the next 120 days, patient will attend all scheduled medical appointments: 10/21/20 with MFM for OB ultrasound, 10/21/20 with [place] for prenatal care  Over the next 120 days, the patient will demonstrate ongoing self health care management ability as evidenced by attending all scheduled appointments  Over the next 30 days, patient will obtain prescribed  BP monitor and check BP as directed by provider  Interventions:   Inter-disciplinary care team collaboration (see longitudinal plan of care)  Evaluation of current treatment plan related to prenatal care and patient's adherence to plan as established by provider.  Advised patient to call for [employer] transportation for medical appointments 3 days before each appointment [phone])  Discussed plans with patient for ongoing care management follow up and provided patient with direct contact information for care management team  Reviewed scheduled/upcoming provider appointments including:10/21/20 in MFM and 10/21/20 at [place] patient to obtained prescribed BP monitor from [place] and check BP weekly reporting any abnormal readings to provider office, as directed by provider  Plan:   Patient will obtain BP monitor, use as directed, call to arrange transportation for medical appointments and attend all scheduled prenatal care appointments.   RNCM will follow up with a telephone visit within 30 days   Please see past updates related to this goal by clicking on the "Past Updates" button in the selected goal        Please see education materials related to pregnancy provided by MyChart link.    Second Trimester of Pregnancy  The second trimester is from week 14 through week 27 (month 4 through 6). This is often the time in pregnancy that you feel your best. Often times, morning sickness has lessened or quit. You may have more energy, and you may get hungry more often. Your unborn baby is growing rapidly. At the end of the sixth month, he or she is about 9 inches long and weighs about 1 pounds. You will likely  feel the baby move between 18 and 20 weeks of pregnancy. Follow these instructions at home: Medicines  Take over-the-counter and prescription medicines only as told by your doctor. Some medicines are safe and some medicines are not safe during  pregnancy.  Take a prenatal vitamin that contains at least 600 micrograms (mcg) of folic acid.  If you have trouble pooping (constipation), take medicine that will make your stool soft (stool softener) if your doctor approves. Eating and drinking   Eat regular, healthy meals.  Avoid raw meat and uncooked cheese.  If you get low calcium from the food you eat, talk to your doctor about taking a daily calcium supplement.  Avoid foods that are high in fat and sugars, such as fried and sweet foods.  If you feel sick to your stomach (nauseous) or throw up (vomit): ? Eat 4 or 5 small meals a day instead of 3 large meals. ? Try eating a few soda crackers. ? Drink liquids between meals instead of during meals.  To prevent constipation: ? Eat foods that are high in fiber, like fresh fruits and vegetables, whole grains, and beans. ? Drink enough fluids to keep your pee (urine) clear or pale yellow. Activity  Exercise only as told by your doctor. Stop exercising if you start to have cramps.  Do not exercise if it is too hot, too humid, or if you are in a place of great height (high altitude).  Avoid heavy lifting.  Wear low-heeled shoes. Sit and stand up straight.  You can continue to have sex unless your doctor tells you not to. Relieving pain and discomfort  Wear a good support bra if your breasts are tender.  Take warm water baths (sitz baths) to soothe pain or discomfort caused by hemorrhoids. Use hemorrhoid cream if your doctor approves.  Rest with your legs raised if you have leg cramps or low back pain.  If you develop puffy, bulging veins (varicose veins) in your legs: ? Wear support hose or compression stockings as told by your doctor. ? Raise (elevate) your feet for 15 minutes, 3-4 times a day. ? Limit salt in your food. Prenatal care  Write down your questions. Take them to your prenatal visits.  Keep all your prenatal visits as told by your doctor. This is  important. Safety  Wear your seat belt when driving.  Make a list of emergency phone numbers, including numbers for family, friends, the hospital, and police and fire departments. General instructions  Ask your doctor about the right foods to eat or for help finding a counselor, if you need these services.  Ask your doctor about local prenatal classes. Begin classes before month 6 of your pregnancy.  Do not use hot tubs, steam rooms, or saunas.  Do not douche or use tampons or scented sanitary pads.  Do not cross your legs for long periods of time.  Visit your dentist if you have not done so. Use a soft toothbrush to brush your teeth. Floss gently.  Avoid all smoking, herbs, and alcohol. Avoid drugs that are not approved by your doctor.  Do not use any products that contain nicotine or tobacco, such as cigarettes and e-cigarettes. If you need help quitting, ask your doctor.  Avoid cat litter boxes and soil used by cats. These carry germs that can cause birth defects in the baby and can cause a loss of your baby (miscarriage) or stillbirth. Contact a doctor if:  You have mild cramps or pressure in  your lower belly.  You have pain when you pee (urinate).  You have bad smelling fluid coming from your vagina.  You continue to feel sick to your stomach (nauseous), throw up (vomit), or have watery poop (diarrhea).  You have a nagging pain in your belly area.  You feel dizzy. Get help right away if:  You have a fever.  You are leaking fluid from your vagina.  You have spotting or bleeding from your vagina.  You have severe belly cramping or pain.  You lose or gain weight rapidly.  You have trouble catching your breath and have chest pain.  You notice sudden or extreme puffiness (swelling) of your face, hands, ankles, feet, or legs.  You have not felt the baby move in over an hour.  You have severe headaches that do not go away when you take medicine.  You have  trouble seeing. Summary  The second trimester is from week 14 through week 27 (months 4 through 6). This is often the time in pregnancy that you feel your best.  To take care of yourself and your unborn baby, you will need to eat healthy meals, take medicines only if your doctor tells you to do so, and do activities that are safe for you and your baby.  Call your doctor if you get sick or if you notice anything unusual about your pregnancy. Also, call your doctor if you need help with the right food to eat, or if you want to know what activities are safe for you. This information is not intended to replace advice given to you by your health care provider. Make sure you discuss any questions you have with your health care provider. Document Revised: 03/14/2019 Document Reviewed: 12/26/2016 Elsevier Patient Education  2020 [employer].    Telephone follow up appointment with Managed Medicaid care management team member scheduled for:10/25/20 @ 10:30am  Lurena Joiner RN, BSN [place] RN Care Coordinator

## 2020-09-23 NOTE — Patient Outreach (Cosign Needed)
Care Coordination - Case Manager  09/23/2020  Nicole Case [birth year] [id number]  Subjective:  Nicole Case is an 19 y.o. year old female who is a primary patient of Patient, No Pcp Per.  Nicole Case was given information about Medicaid Managed Care team care coordination services today. Nicole Case agreed to services and verbal consent obtained  I was able to engage with Nicole Case today briefly as our call was disconnected during medication review. My re attempt to connect was unsuccessful. During our conversation, Nicole Case reports stable housing, but would like information regarding affordable housing. I will refer her to one of my team mates for housing options.  Review of patient status, laboratory and other test data was performed as part of evaluation for provision of services.  SDOH: SDOH Screenings   Food Insecurity: No Food Insecurity  . Worried About Charity fundraiser in the Last Year: Never true  . Ran Out of Food in the Last Year: Never true  Housing: Low Risk   . Last Housing Risk Score: 0  Stress:   . Feeling of Stress : Not on file  Tobacco Use: Low Risk   . Smoking Tobacco Use: Never Smoker  . Smokeless Tobacco Use: Never Used  Transportation Needs: Unmet Transportation Needs  . Lack of Transportation (Medical): Yes  . Lack of Transportation (Non-Medical): Yes   SDOH Interventions     Most Recent Value  SDOH Interventions  Food Insecurity Interventions Intervention Not Indicated  Housing Interventions Intervention Not Indicated  Transportation Interventions Other (Comment)  [Contact information for [employer] transportation provided to patient]      Objective:    No Known Allergies  Medications:    Medications Reviewed Today    Reviewed by Melissa Montane, RN (Registered Nurse) on 09/23/20 at 91  Med List Status: <None>  Medication Order Taking? Sig Documenting Provider Last Dose Status Informant  acetaminophen  (TYLENOL) 325 MG tablet [id number] No Take 2 tablets (650 mg total) by mouth every 6 (six) hours as needed for mild pain, moderate pain, fever or headache.  Patient not taking: Reported on 05/10/2020   [name], FNP Not Taking Active Self  amoxicillin-clavulanate (AUGMENTIN) 875-125 MG tablet [id number] No Take 1 tablet by mouth every 12 (twelve) hours.  Patient not taking: Reported on 09/21/2020   Arlean Hopping Not Taking Active            Med Note Thamas Jaegers, Kosei Rhodes A   Thu Sep 23, 2020 10:32 AM) completed  aspirin 81 MG chewable tablet [id number] Yes Chew 1 tablet (81 mg total) by mouth daily. Nicole Case, CNM Taking Active Self  Blood Pressure Monitoring (BLOOD PRESSURE KIT) DEVI [id number] No 1 kit by Does not apply route once a week. Check Blood Pressure regularly and record readings into the Babyscripts App.  Large Cuff.  DX O90.0  Patient not taking: Reported on 09/23/2020   Cephas Darby, MD Not Taking Active            Med Note Thamas Jaegers, Craigory Toste Sibal A   Thu Sep 23, 2020 10:33 AM) Waiting for medicaid card  Doxylamine-Pyridoxine (DICLEGIS) 10-10 MG TBEC [id number] No Take 2 tablets by mouth at bedtime.  Patient not taking: Reported on 09/21/2020   Nicole Case, CNM Not Taking Active Self  ferrous sulfate 325 (65 FE) MG tablet [id number] No Take 1 tablet (325 mg total) by mouth 2 (two) times daily with a meal.  Patient not taking:  Reported on 09/08/2020   Nicole Case, CNM Unknown Active Self  folic acid (FOLVITE) 1 MG tablet [id number] No Take 1 tablet (1 mg total) by mouth daily.  Patient not taking: Reported on 09/21/2020   [name], FNP Unknown Active Self  ondansetron (ZOFRAN) 4 MG tablet [id number] No Take 1 tablet (4 mg total) by mouth daily as needed for nausea or vomiting.  Patient not taking: Reported on 06/29/2020   [name], FNP Unknown Active Self  oxyCODONE (ROXICODONE) 5 MG immediate release tablet [id number] No Take 1 tablet (5 mg total) by  mouth every 4 (four) hours as needed for severe pain.  Patient not taking: Reported on 09/21/2020   [name], FNP Unknown Active Self  Prenatal Vit-Fe Fumarate-FA (PRENATAL VITAMIN PO) [id number] No Take 2 tablets by mouth daily.  [provider] Unknown Active Self          Assessment:   Goals Addressed            This Visit's Progress   . "I need transportation to get to my doctor appointments"       [place] (see longitudinal plan of care for additional care plan information)  Current Barriers:  . Care Coordination needs related to prenatal care . Transportation barriers . Non-adherence to scheduled provider appointments. Patient unable to attend OB appointments due to lack of transportation. Update-Patient attended OB and MFM appt on 10/19.  Nurse Case Manager Clinical Goal(s):  Marland Kitchen Over the next 14 days, patient will verbalize understanding of plan for prenatal care-Met-Patient attended OB and MFM appointment on 10/19, has future apptointments on 11/18 . Over the next 120 days, patient will attend all scheduled medical appointments: 10/21/20 with MFM for OB ultrasound, 10/21/20 with [place] for prenatal care . Over the next 120 days, the patient will demonstrate ongoing self health care management ability as evidenced by attending all scheduled appointments . Over the next 30 days, patient will obtain prescribed BP monitor and check BP as directed by provider  Interventions:  . Inter-disciplinary care team collaboration (see longitudinal plan of care) . Evaluation of current treatment plan related to prenatal care and patient's adherence to plan as established by provider. . Advised patient to call for [employer] transportation for medical appointments 3 days before each appointment [phone]) . Discussed plans with patient for ongoing care management follow up and provided patient with direct contact information for care  management team . Reviewed scheduled/upcoming provider appointments including:10/21/20 in MFM and 10/21/20 at [facility] . Advised patient to obtained prescribed BP monitor from [place] and check BP weekly reporting any abnormal readings to provider office, as directed by provider  Plan:  . Patient will obtain BP monitor, use as directed, call to arrange transportation for medical appointments and attend all scheduled prenatal care appointments.  Marland Kitchen RNCM will follow up with a telephone visit within 30 days   Please see past updates related to this goal by clicking on the "Past Updates" button in the selected goal        Plan: RNCM will follow up with a telephone visit on 10/25/20 @ 10:30am  Lurena Joiner RN, [place] RN Care Coordinator

## 2020-09-27 ENCOUNTER — Telehealth: Payer: Self-pay

## 2020-09-27 NOTE — Telephone Encounter (Signed)
    MA10/25/2021 3rd Attempt  Name: Nicole Case   MRN: [id number]   DOB: 01-Mar-2001   AGE: 19 y.o.   GENDER: female   PCP Patient, No Pcp Per.   09/27/20 3rd attempt to follow-up.  Unable to leave a message on voicemail regarding Medicaid transportation.  Closing referral unable to contact after 3 attempts.    Squire Withey, AAS Paralegal, [facility] Care Guide . Embedded Care Coordination [facility]  Care Management  [address] [place], [place] [address] [email or web address]  [phone]   [email or web address]

## 2020-10-08 ENCOUNTER — Other Ambulatory Visit: Payer: Self-pay

## 2020-10-08 NOTE — Patient Outreach (Signed)
Care Coordination- Social Work  10/08/2020  [place] 30-Apr-2001 [id number]  Subjective:    Nicole Case is an 19 y.o. year old female who is a primary patient of Patient, No Pcp Per.    Nicole Case was given information about Medicaid Managed Care team care coordination services today. [name] agreed to services and verbal consent obtained  Review of patient status, laboratory and other test data was performed as part of evaluation for provision of services.  SDOH:   SDOH Screenings   Alcohol Screen:   . Last Alcohol Screening Score (AUDIT): Not on file  Depression (PHQ2-9):   . PHQ-2 Score: Not on file  Financial Resource Strain:   . Difficulty of Paying Living Expenses: Not on file  Food Insecurity: No Food Insecurity  . Worried About Charity fundraiser in the Last Year: Never true  . Ran Out of Food in the Last Year: Never true  Housing: Low Risk   . Last Housing Risk Score: 0  Physical Activity:   . Days of Exercise per Week: Not on file  . Minutes of Exercise per Session: Not on file  Social Connections:   . Frequency of Communication with Friends and Family: Not on file  . Frequency of Social Gatherings with Friends and Family: Not on file  . Attends Religious Services: Not on file  . Active Member of Clubs or Organizations: Not on file  . Attends Archivist Meetings: Not on file  . Marital Status: Not on file  Stress:   . Feeling of Stress : Not on file  Tobacco Use: Low Risk   . Smoking Tobacco Use: Never Smoker  . Smokeless Tobacco Use: Never Used  Transportation Needs: Unmet Transportation Needs  . Lack of Transportation (Medical): Yes  . Lack of Transportation (Non-Medical): Yes     Objective:    Medications:  Medications Reviewed Today    Reviewed by Melissa Montane, RN (Registered Nurse) on 09/23/20 at [place] List Status: <None>  Medication Order Taking? Sig Documenting Provider Last Dose Status Informant   acetaminophen (TYLENOL) 325 MG tablet [id number] No Take 2 tablets (650 mg total) by mouth every 6 (six) hours as needed for mild pain, moderate pain, fever or headache.  Patient not taking: Reported on 05/10/2020   [name], FNP Not Taking Active Self  amoxicillin-clavulanate (AUGMENTIN) 875-125 MG tablet [id number] No Take 1 tablet by mouth every 12 (twelve) hours.  Patient not taking: Reported on 09/21/2020   Arlean Hopping Not Taking Active            Med Note Thamas Jaegers, MELANIE A   Thu Sep 23, 2020 10:32 AM) completed  aspirin 81 MG chewable tablet [id number] Yes Chew 1 tablet (81 mg total) by mouth daily. [name], CNM Taking Active Self  Blood Pressure Monitoring (BLOOD PRESSURE KIT) DEVI [id number] No 1 kit by Does not apply route once a week. Check Blood Pressure regularly and record readings into the Babyscripts App.  Large Cuff.  DX O90.0  Patient not taking: Reported on 09/23/2020   Cephas Darby, MD Not Taking Active            Med Note Thamas Jaegers, MELANIE A   Thu Sep 23, 2020 10:33 AM) Waiting for medicaid card  Doxylamine-Pyridoxine (DICLEGIS) 10-10 MG TBEC [id number] No Take 2 tablets by mouth at bedtime.  Patient not taking: Reported on 09/21/2020   [name], CNM Not Taking Active  Self  ferrous sulfate 325 (65 FE) MG tablet [id number] No Take 1 tablet (325 mg total) by mouth 2 (two) times daily with a meal.  Patient not taking: Reported on 09/08/2020   [name], CNM Unknown Active Self  folic acid (FOLVITE) 1 MG tablet [id number] No Take 1 tablet (1 mg total) by mouth daily.  Patient not taking: Reported on 09/21/2020   [name], FNP Unknown Active Self  ondansetron (ZOFRAN) 4 MG tablet [id number] No Take 1 tablet (4 mg total) by mouth daily as needed for nausea or vomiting.  Patient not taking: Reported on 06/29/2020   [name], FNP Unknown Active Self  oxyCODONE (ROXICODONE) 5 MG immediate release tablet [id number] No Take 1 tablet (5  mg total) by mouth every 4 (four) hours as needed for severe pain.  Patient not taking: Reported on 09/21/2020   [name], FNP Unknown Active Self  Prenatal Vit-Fe Fumarate-FA (PRENATAL VITAMIN PO) [id number] No Take 2 tablets by mouth daily.  [provider] Unknown Active Self          Fall/Depression Screening:  No flowsheet data found. No flowsheet data found.   Plan: BSW provided patient with the phone number to the Patient [place] to make a New patient appointment.

## 2020-10-08 NOTE — Patient Instructions (Addendum)
   Visit Information  Nicole Case was given information about Medicaid Managed Care team care coordination services as a part of their Healthy [facility] Medicaid benefit. Nicole Case verbally consented to engagement with the [facility] Managed Care team.   For questions related to your Healthy [facility] health plan, please call: [phone] or visit the homepage here: [email or web address]  If you would like to schedule transportation through your Healthy [facility] plan, please call the following number at least 2 days in advance of your appointment: [phone]    The patient has been provided with contact information for the Managed Medicaid care management team and has been advised to call with any health related questions or concerns.   Gus Puma, BSW, [facility]  [place]  High Risk Managed Medicaid Team

## 2020-10-08 NOTE — Patient Outreach (Signed)
Care Coordination  10/08/2020  Nicole Case May 14, 2001 [id number]  An unsuccessful telephone outreach was attempted today. The patient was referred to the case management team for assistance with care management and care coordination.   Follow Up Plan: The Managed Medicaid care management team will reach out to the patient again over the next 14 days.   Gus Puma, BSW, [facility]   [place]  High Risk Managed Medicaid Team

## 2020-10-19 ENCOUNTER — Other Ambulatory Visit: Payer: Self-pay

## 2020-10-19 ENCOUNTER — Inpatient Hospital Stay (HOSPITAL_COMMUNITY)
Admission: EM | Admit: 2020-10-19 | Discharge: 2020-10-24 | DRG: 831 | Disposition: A | Discharge status: Home / Self Care | Payer: Medicaid Other | Attending: Obstetrics and Gynecology | Admitting: Obstetrics and Gynecology

## 2020-10-19 ENCOUNTER — Encounter (HOSPITAL_COMMUNITY): Payer: Self-pay | Admitting: Student

## 2020-10-19 ENCOUNTER — Emergency Department (HOSPITAL_COMMUNITY): Payer: Medicaid Other

## 2020-10-19 DIAGNOSIS — Z3493 Encounter for supervision of normal pregnancy, unspecified, third trimester: Secondary | ICD-10-CM | POA: Diagnosis not present

## 2020-10-19 DIAGNOSIS — D571 Sickle-cell disease without crisis: Secondary | ICD-10-CM | POA: Diagnosis present

## 2020-10-19 DIAGNOSIS — D649 Anemia, unspecified: Secondary | ICD-10-CM

## 2020-10-19 DIAGNOSIS — O99282 Endocrine, nutritional and metabolic diseases complicating pregnancy, second trimester: Secondary | ICD-10-CM | POA: Diagnosis not present

## 2020-10-19 DIAGNOSIS — R112 Nausea with vomiting, unspecified: Secondary | ICD-10-CM | POA: Diagnosis not present

## 2020-10-19 DIAGNOSIS — Z3A27 27 weeks gestation of pregnancy: Secondary | ICD-10-CM

## 2020-10-19 DIAGNOSIS — O99012 Anemia complicating pregnancy, second trimester: Secondary | ICD-10-CM | POA: Diagnosis present

## 2020-10-19 DIAGNOSIS — U071 COVID-19: Principal | ICD-10-CM | POA: Diagnosis present

## 2020-10-19 DIAGNOSIS — O98512 Other viral diseases complicating pregnancy, second trimester: Principal | ICD-10-CM | POA: Diagnosis present

## 2020-10-19 DIAGNOSIS — O99512 Diseases of the respiratory system complicating pregnancy, second trimester: Secondary | ICD-10-CM | POA: Diagnosis not present

## 2020-10-19 DIAGNOSIS — O99212 Obesity complicating pregnancy, second trimester: Secondary | ICD-10-CM | POA: Diagnosis not present

## 2020-10-19 DIAGNOSIS — D696 Thrombocytopenia, unspecified: Secondary | ICD-10-CM | POA: Diagnosis present

## 2020-10-19 DIAGNOSIS — J1282 Pneumonia due to coronavirus disease 2019: Secondary | ICD-10-CM | POA: Diagnosis not present

## 2020-10-19 DIAGNOSIS — E876 Hypokalemia: Secondary | ICD-10-CM | POA: Diagnosis not present

## 2020-10-19 DIAGNOSIS — R6510 Systemic inflammatory response syndrome (SIRS) of non-infectious origin without acute organ dysfunction: Secondary | ICD-10-CM | POA: Diagnosis not present

## 2020-10-19 DIAGNOSIS — R509 Fever, unspecified: Secondary | ICD-10-CM | POA: Diagnosis not present

## 2020-10-19 DIAGNOSIS — O99112 Other diseases of the blood and blood-forming organs and certain disorders involving the immune mechanism complicating pregnancy, second trimester: Secondary | ICD-10-CM | POA: Diagnosis present

## 2020-10-19 DIAGNOSIS — D6489 Other specified anemias: Secondary | ICD-10-CM | POA: Diagnosis not present

## 2020-10-19 LAB — CBC WITH DIFFERENTIAL/PLATELET
Abs Immature Granulocytes: 0.08 10*3/uL — ABNORMAL HIGH (ref 0.00–0.07)
Basophils Absolute: 0 10*3/uL (ref 0.0–0.1)
Basophils Relative: 0 %
Eosinophils Absolute: 0 10*3/uL (ref 0.0–0.5)
Eosinophils Relative: 0 %
HCT: 26.6 % — ABNORMAL LOW (ref 36.0–46.0)
Hemoglobin: 9.2 g/dL — ABNORMAL LOW (ref 12.0–15.0)
Immature Granulocytes: 2 %
Lymphocytes Relative: 15 %
Lymphs Abs: 0.8 10*3/uL (ref 0.7–4.0)
MCH: 24.4 pg — ABNORMAL LOW (ref 26.0–34.0)
MCHC: 34.6 g/dL (ref 30.0–36.0)
MCV: 70.6 fL — ABNORMAL LOW (ref 80.0–100.0)
Monocytes Absolute: 0.5 10*3/uL (ref 0.1–1.0)
Monocytes Relative: 9 %
Neutro Abs: 3.7 10*3/uL (ref 1.7–7.7)
Neutrophils Relative %: 74 %
Platelets: 129 10*3/uL — ABNORMAL LOW (ref 150–400)
RBC: 3.77 MIL/uL — ABNORMAL LOW (ref 3.87–5.11)
RDW: 21.2 % — ABNORMAL HIGH (ref 11.5–15.5)
WBC: 5 10*3/uL (ref 4.0–10.5)
nRBC: 0.8 % — ABNORMAL HIGH (ref 0.0–0.2)

## 2020-10-19 LAB — COMPREHENSIVE METABOLIC PANEL
ALT: 15 U/L (ref 0–44)
AST: 29 U/L (ref 15–41)
Albumin: 3.4 g/dL — ABNORMAL LOW (ref 3.5–5.0)
Alkaline Phosphatase: 96 U/L (ref 38–126)
Anion gap: 13 (ref 5–15)
BUN: <5 mg/dL — ABNORMAL LOW (ref 6–20)
CO2: 20 mmol/L — ABNORMAL LOW (ref 22–32)
Calcium: 8.5 mg/dL — ABNORMAL LOW (ref 8.9–10.3)
Chloride: 100 mmol/L (ref 98–111)
Creatinine, Ser: 0.61 mg/dL (ref 0.44–1.00)
GFR, Estimated: >60 mL/min (ref >60)
Glucose, Bld: 112 mg/dL — ABNORMAL HIGH (ref 70–99)
Potassium: 3.3 mmol/L — ABNORMAL LOW (ref 3.5–5.1)
Sodium: 133 mmol/L — ABNORMAL LOW (ref 135–145)
Total Bilirubin: 2 mg/dL — ABNORMAL HIGH (ref 0.3–1.2)
Total Protein: 7.8 g/dL (ref 6.5–8.1)

## 2020-10-19 LAB — RESP PANEL BY RT PCR (RSV, FLU A&B, COVID)
Influenza A by PCR: NEGATIVE
Influenza B by PCR: NEGATIVE
Respiratory Syncytial Virus by PCR: NEGATIVE
SARS Coronavirus 2 by RT PCR: POSITIVE — AB

## 2020-10-19 LAB — LIPASE, BLOOD: Lipase: 17 U/L (ref 11–51)

## 2020-10-19 LAB — ETHANOL: Alcohol, Ethyl (B): <10 mg/dL (ref <10)

## 2020-10-19 MED ORDER — PROCHLORPERAZINE EDISYLATE 10 MG/2ML IJ SOLN: 10.0000 mg | Freq: Once | INTRAMUSCULAR | Status: DC

## 2020-10-19 MED ORDER — ENOXAPARIN SODIUM 40 MG/0.4ML ~~LOC~~ SOLN
40.0000 mg | SUBCUTANEOUS | Status: DC
  Administered 2020-10-20 – 2020-10-22 (×3): 40 mg via SUBCUTANEOUS
  Filled 2020-10-19 (×3): qty 0.4

## 2020-10-19 MED ORDER — DOCUSATE SODIUM 100 MG PO CAPS
100.0000 mg | ORAL_CAPSULE | Freq: Every day | ORAL | Status: DC
  Administered 2020-10-22 – 2020-10-24 (×2): 100 mg via ORAL
  Filled 2020-10-19 (×4): qty 1

## 2020-10-19 MED ORDER — DEXTROSE-NACL 5-0.45 % IV SOLN
INTRAVENOUS | Status: DC
  Administered 2020-10-22: 100 mL/h via INTRAVENOUS

## 2020-10-19 MED ORDER — SODIUM CHLORIDE 0.9 % IV BOLUS
1000.0000 mL | Freq: Once | INTRAVENOUS | Status: AC
  Administered 2020-10-19: 1000 mL via INTRAVENOUS

## 2020-10-19 MED ORDER — CALCIUM CARBONATE ANTACID 500 MG PO CHEW
2.0000 | CHEWABLE_TABLET | ORAL | Status: DC | PRN
  Administered 2020-10-23: 400 mg via ORAL
  Filled 2020-10-19: qty 2

## 2020-10-19 MED ORDER — PRENATAL MULTIVITAMIN CH
1.0000 | ORAL_TABLET | Freq: Every day | ORAL | Status: DC
  Administered 2020-10-21 – 2020-10-24 (×4): 1 via ORAL
  Filled 2020-10-19 (×5): qty 1

## 2020-10-19 MED ORDER — ACETAMINOPHEN 325 MG PO TABS
650.0000 mg | ORAL_TABLET | ORAL | Status: DC | PRN
  Administered 2020-10-20 – 2020-10-21 (×3): 650 mg via ORAL
  Filled 2020-10-19 (×3): qty 2

## 2020-10-19 MED ORDER — ONDANSETRON HCL 4 MG/2ML IJ SOLN
4.0000 mg | Freq: Once | INTRAMUSCULAR | Status: AC
  Administered 2020-10-19: 4 mg via INTRAVENOUS
  Filled 2020-10-19: qty 2

## 2020-10-19 MED ORDER — ACETAMINOPHEN 500 MG PO TABS
1000.0000 mg | ORAL_TABLET | Freq: Once | ORAL | Status: AC
  Administered 2020-10-19: 1000 mg via ORAL
  Filled 2020-10-19: qty 2

## 2020-10-19 NOTE — ED Notes (Signed)
Fetal HR 165.

## 2020-10-19 NOTE — ED Triage Notes (Addendum)
Patient BIB POV c/o fever, N/V, headache, and hematemesis X1 week. Patient stating she had a fever earlier of 101.9, she did not take anything for it.  Has only taken dayquil.  Patient ambulatory from triage.  Patient has a bottle of red Gatorade on the bedside table.  Once arriving to the room, patient vomited all over the floor, mostly clear with some bit of undigested food and gatorade.  Let patient know not to drink anymore of the Gatorade at this time.  Patient asking for water, also asked her to hold off on that due to vomiting.  Patient last tested for COVID 2.5 weeks ago per patient, was negative per patient.  Patient is not vaccinated.

## 2020-10-19 NOTE — ED Provider Notes (Signed)
[facility] DEPT Provider Note   CSN: [id number] Arrival date & time: 10/19/20  1557     History Chief Complaint  Patient presents with  . Cough  . Fever  . Nausea  . Emesis  . Hematemesis  . Migraine    Nicole Case is a 19 y.o. female.  HPI 19 year old female, G1, P0 at about 7 weeks pregnancy presents with 1 week of nausea, anorexia, generalized discomfort.  Today, the patient also has had vomiting, and fever.  Patient denies focal abdominal pain, vaginal bleeding or discharge, dysuria.  She has not had her Covid vaccines, but denies any obvious exposure. 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   Past Medical History:  Diagnosis Date  . Anxiety   . Sickle cell anemia (HCC)   . Sickle cell anemia [facility])     Patient Active Problem List   Diagnosis Date Noted  . 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 Carrier of genetic disorder 07/10/2020  . 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 Trichomonal vaginitis during pregnancy in first trimester 07/03/2020  . Sickle cell anemia of mother during pregnancy ([place]) 07/03/2020  . Maternal morbid obesity, antepartum ([place]) 06/29/2020  . Supervision of high risk pregnancy, antepartum 06/28/2020  . Central chest pain   . Sickle cell anemia with crisis ([place]) 06/20/2020  . Sore throat 10/06/2019  . Fever 10/06/2019  . Urinary tract infection symptoms 10/06/2019  . Sickle-cell disease with pain ([place]) 08/01/2017  . Sickle cell disease, type [place], with unspecified crisis ([place]) 07/31/2017  . Sickle cell anemia with pain ([place]) 05/28/2016  . 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 Sickle cell pain crisis ([place]) 01/29/2014  . Community acquired pneumonia 12/12/2011  . Sickle cell pain crisis 12/12/2011    Past Surgical History:  Procedure Laterality Date  . NO PAST SURGERIES       OB History    Gravida  1    Para      Term      Preterm      AB      Living        SAB      TAB      Ectopic      Multiple      Live Births              Family History  Problem Relation Age of Onset  . Sickle cell anemia Brother     Social History   Tobacco Use  . Smoking status: Never Smoker  . Smokeless tobacco: Never Used  Vaping Use  . Vaping Use: Never used  Substance Use Topics  . Alcohol use: No  . Drug use: Not Currently    Types: Marijuana    Home Medications Prior to Admission medications   Medication Sig Start Date End Date Taking? Authorizing Provider  aspirin 81 MG chewable tablet Chew 1 tablet (81 mg total) by mouth daily. 06/29/20  Yes [name], CNM  Doxylamine-Pyridoxine (DICLEGIS) 10-10 MG TBEC Take 2 tablets by mouth at bedtime. Patient taking differently: Take 2 tablets by mouth daily as needed (nausea & vomiting).  06/29/20  Yes [name], CNM  oxyCODONE (ROXICODONE) 5 MG immediate release tablet Take 1 tablet (5 mg total) by mouth every 4 (four) hours as needed for severe pain. 06/22/20  Yes [name], FNP  Prenatal Vit-Fe Fumarate-FA (PRENATAL VITAMIN PO)  Take 2 tablets by mouth daily.    Yes [provider]  acetaminophen (TYLENOL) 325 MG tablet Take 2 tablets (650 mg total) by mouth every 6 (six) hours as needed for mild pain, moderate pain, fever or headache. Patient not taking: Reported on 05/10/2020 10/08/19   [name], FNP  amoxicillin-clavulanate (AUGMENTIN) 875-125 MG tablet Take 1 tablet by mouth every 12 (twelve) hours. Patient not taking: Reported on 09/21/2020 09/08/20   Kinnie Feil, PA-C  Blood Pressure Monitoring (BLOOD PRESSURE KIT) DEVI 1 kit by Does not apply route once a week. Check Blood Pressure regularly and record readings into the Babyscript[employer] App.  Large Cuff.  DX O90.0 Patient not taking: Reported on 09/23/2020 09/21/20   Cephas Darby, MD  ferrous sulfate 325 (65 FE) MG tablet Take 1 tablet (325 mg  total) by mouth 2 (two) times daily with a meal. Patient not taking: Reported on 09/08/2020 07/03/20   [name], CNM  ondansetron (ZOFRAN) 4 MG tablet Take 1 tablet (4 mg total) by mouth daily as needed for nausea or vomiting. Patient not taking: Reported on 06/29/2020 06/22/20 06/22/21  [name], FNP    Allergies    Patient has no known allergies.  Review of Systems   Review of Systems  Constitutional:       Per HPI, otherwise negative  HENT:       Per HPI, otherwise negative  Respiratory:       Per HPI, otherwise negative  Cardiovascular:       Per HPI, otherwise negative  Gastrointestinal: Negative for vomiting.  Endocrine:       Negative aside from HPI  Genitourinary:       Neg aside from HPI   Musculoskeletal:       Per HPI, otherwise negative  Skin: Negative.   Allergic/Immunologic: Positive for immunocompromised state.  Neurological: Negative for syncope.  Hematological:       Per HPI, sickle cell disease    Physical Exam Updated Vital Signs BP (!) 126/104   Pulse (!) 125   Temp (!) 101.2 F (38.4 C) (Oral)   Resp (!) 30   Ht 5' 7"  (1.702 m)   Wt 97.5 kg   LMP 04/10/2020 (Exact Date)   SpO2 98%   BMI 33.67 kg/m   Physical Exam Vitals and nursing note reviewed.  Constitutional:      Appearance: She is well-developed. She is obese. She is ill-appearing.  HENT:     Head: Normocephalic and atraumatic.  Eyes:     Conjunctiva/sclera: Conjunctivae normal.  Cardiovascular:     Rate and Rhythm: Regular rhythm. Tachycardia present.  Pulmonary:     Effort: Tachypnea present.     Breath sounds: Decreased breath sounds present.  Abdominal:     General: There is no distension.     Tenderness: There is no abdominal tenderness. There is no guarding or rebound.  Skin:    General: Skin is warm and dry.  Neurological:     Mental Status: She is alert and oriented to person, place, and time.     Cranial Nerves: No cranial nerve deficit.      ED  Results / Procedures / Treatments   Labs (all labs ordered are listed, but only abnormal results are displayed) Labs Reviewed  RESP PANEL BY RT PCR (RSV, FLU A&B, COVID) - Abnormal; Notable for the following components:      Result Value   SARS Coronavirus 2 by RT PCR POSITIVE (*)  All other components within normal limits  COMPREHENSIVE METABOLIC PANEL - Abnormal; Notable for the following components:   Sodium 133 (*)    Potassium 3.3 (*)    CO2 20 (*)    Glucose, Bld 112 (*)    BUN <5 (*)    Calcium 8.5 (*)    Albumin 3.4 (*)    Total Bilirubin 2.0 (*)    All other components within normal limits  CBC WITH DIFFERENTIAL/PLATELET - Abnormal; Notable for the following components:   RBC 3.77 (*)    Hemoglobin 9.2 (*)    HCT 26.6 (*)    MCV 70.6 (*)    MCH 24.4 (*)    RDW 21.2 (*)    Platelets 129 (*)    nRBC 0.8 (*)    Abs Immature Granulocytes 0.08 (*)    All other components within normal limits  ETHANOL  LIPASE, BLOOD  URINALYSIS, ROUTINE W REFLEX MICROSCOPIC    EKG None  Radiology DG Chest Port 1 View  Result Date: 10/19/2020 CLINICAL DATA:  Fever EXAM: PORTABLE CHEST 1 VIEW COMPARISON:  09/08/2020 FINDINGS: Airspace opacity noted in the right mid lung compatible with pneumonia. Probable patchy left mid to lower lung opacity. Low lung volumes. Heart is normal size. No effusions or pneumothorax. No acute bony abnormality. IMPRESSION: Patchy bilateral airspace opacities, greatest in the right mid lung concerning for pneumonia. Electronically Signed   By: Rolm Baptise M.D.   On: 10/19/2020 17:22    Procedures Procedures (including critical care time)  Medications Ordered in ED Medications  dextrose 5 %-0.45 % sodium chloride infusion (has no administration in time range)  sodium chloride 0.9 % bolus 1,000 mL (0 mLs Intravenous Stopped 10/19/20 1844)  acetaminophen (TYLENOL) tablet 1,000 mg (1,000 mg Oral Given 10/19/20 1713)  ondansetron (ZOFRAN) injection 4 mg (4  mg Intravenous Given 10/19/20 1710)    ED Course  I have reviewed the triage vital signs and the nursing notes.  Pertinent labs & imaging results that were available during my care of the patient were reviewed by me and considered in my medical decision making (see chart for details).  Adult female following a pregnancy presents with generalized concerns, nausea, vomiting, weakness, cough.  Fetal heart tones 160, patient denies abdominal pain, vaginal bleeding or discharge, which is initially reassuring for her pregnancy. With immediate concern for infection versus Covid versus pregnancy complication patient was placed on continuous monitoring, received fluid resuscitation, antiemetics.  On repeat exam, patient is no longer vomiting, heart rate slightly diminished from 140s, now 120s.  Initial x-ray reviewed, concerning for pneumonia discussed with patient.  Patient does not require oxygen, but has mild persistent tachypnea.  7:35 PM Remaining findings concerning for Covid positive result.  Patient remains tachycardic, tachypneic, meets SIRS criteria, and with concern for ongoing pregnancy, Covid infection, I discussed her case with our OB colleagues for transfer to the women's hospital. Dr. Elly Modena is aware of the patient. Final Clinical Impression(s) / ED Diagnoses Final diagnoses:  Pneumonia due to COVID-19 virus  SIRS (systemic inflammatory response syndrome) ([place])   MDM Number of Diagnoses or Management Options Pneumonia due to COVID-19 virus: new, needed workup SIRS (systemic inflammatory response syndrome) ([place]): new, needed workup   Amount and/or Complexity of Data Reviewed Clinical lab tests: reviewed Tests in the radiology section of CPT: reviewed Tests in the medicine section of CPT: reviewed Decide to obtain previous medical records or to obtain history from someone other than the patient: yes Obtain history  from someone other than the patient: yes Review and summarize  past medical records: yes Discuss the patient with other providers: yes Independent visualization of images, tracings, or specimens: yes  Risk of Complications, Morbidity, and/or Mortality Presenting problems: high Diagnostic procedures: high Management options: high  Critical Care Total time providing critical care: 30-74 minutes (35)  Patient Progress Patient progress: stable    Carmin Muskrat, MD 10/19/20 1939

## 2020-10-20 ENCOUNTER — Encounter (HOSPITAL_COMMUNITY): Payer: Self-pay | Admitting: Obstetrics and Gynecology

## 2020-10-20 DIAGNOSIS — O98512 Other viral diseases complicating pregnancy, second trimester: Secondary | ICD-10-CM | POA: Diagnosis not present

## 2020-10-20 DIAGNOSIS — Z3A27 27 weeks gestation of pregnancy: Secondary | ICD-10-CM | POA: Diagnosis not present

## 2020-10-20 DIAGNOSIS — D696 Thrombocytopenia, unspecified: Secondary | ICD-10-CM | POA: Diagnosis not present

## 2020-10-20 DIAGNOSIS — O99282 Endocrine, nutritional and metabolic diseases complicating pregnancy, second trimester: Secondary | ICD-10-CM | POA: Diagnosis not present

## 2020-10-20 DIAGNOSIS — O99012 Anemia complicating pregnancy, second trimester: Secondary | ICD-10-CM | POA: Diagnosis not present

## 2020-10-20 DIAGNOSIS — E876 Hypokalemia: Secondary | ICD-10-CM | POA: Diagnosis present

## 2020-10-20 DIAGNOSIS — Z3493 Encounter for supervision of normal pregnancy, unspecified, third trimester: Secondary | ICD-10-CM

## 2020-10-20 DIAGNOSIS — D571 Sickle-cell disease without crisis: Secondary | ICD-10-CM | POA: Diagnosis not present

## 2020-10-20 DIAGNOSIS — O99512 Diseases of the respiratory system complicating pregnancy, second trimester: Secondary | ICD-10-CM | POA: Diagnosis not present

## 2020-10-20 DIAGNOSIS — J1282 Pneumonia due to coronavirus disease 2019: Secondary | ICD-10-CM | POA: Diagnosis not present

## 2020-10-20 DIAGNOSIS — U071 COVID-19: Secondary | ICD-10-CM

## 2020-10-20 DIAGNOSIS — O99212 Obesity complicating pregnancy, second trimester: Secondary | ICD-10-CM | POA: Diagnosis not present

## 2020-10-20 DIAGNOSIS — O99112 Other diseases of the blood and blood-forming organs and certain disorders involving the immune mechanism complicating pregnancy, second trimester: Secondary | ICD-10-CM | POA: Diagnosis not present

## 2020-10-20 LAB — LACTATE DEHYDROGENASE: LDH: 203 U/L — ABNORMAL HIGH (ref 98–192)

## 2020-10-20 LAB — D-DIMER, QUANTITATIVE: D-Dimer, Quant: 1.92 ug/mL-FEU — ABNORMAL HIGH (ref 0.00–0.50)

## 2020-10-20 LAB — FERRITIN: Ferritin: 31 ng/mL (ref 11–307)

## 2020-10-20 LAB — TYPE AND SCREEN
ABO/RH(D): A POS
ABO/RH(D): A POS
Antibody Screen: NEGATIVE
Antibody Screen: NEGATIVE

## 2020-10-20 LAB — C-REACTIVE PROTEIN: CRP: 5 mg/dL — ABNORMAL HIGH (ref <1.0)

## 2020-10-20 LAB — FIBRINOGEN: Fibrinogen: 417 mg/dL (ref 210–475)

## 2020-10-20 LAB — PROCALCITONIN: Procalcitonin: <0.1 ng/mL

## 2020-10-20 MED ORDER — LACTATED RINGERS IV BOLUS: 500.0000 mL | Freq: Once | INTRAVENOUS | Status: DC

## 2020-10-20 MED ORDER — ALBUTEROL SULFATE HFA 108 (90 BASE) MCG/ACT IN AERS
2.0000 | INHALATION_SPRAY | Freq: Four times a day (QID) | RESPIRATORY_TRACT | Status: DC
  Administered 2020-10-20 – 2020-10-24 (×18): 2 via RESPIRATORY_TRACT
  Filled 2020-10-20 (×3): qty 6.7

## 2020-10-20 MED ORDER — LACTATED RINGERS IV SOLN: INTRAVENOUS | Status: DC

## 2020-10-20 MED ORDER — ONDANSETRON HCL 4 MG/2ML IJ SOLN
4.0000 mg | Freq: Four times a day (QID) | INTRAMUSCULAR | Status: DC | PRN
  Administered 2020-10-20 – 2020-10-24 (×4): 4 mg via INTRAVENOUS
  Filled 2020-10-20 (×4): qty 2

## 2020-10-20 MED ORDER — DIPHENHYDRAMINE HCL 50 MG/ML IJ SOLN: 50.0000 mg | Freq: Once | INTRAMUSCULAR | Status: DC | PRN

## 2020-10-20 MED ORDER — LACTATED RINGERS IV BOLUS
500.0000 mL | Freq: Once | INTRAVENOUS | Status: AC
  Administered 2020-10-20: 500 mL via INTRAVENOUS

## 2020-10-20 MED ORDER — ZINC SULFATE 220 (50 ZN) MG PO CAPS
220.0000 mg | ORAL_CAPSULE | Freq: Every day | ORAL | Status: DC
  Administered 2020-10-20 – 2020-10-24 (×5): 220 mg via ORAL
  Filled 2020-10-20 (×5): qty 1

## 2020-10-20 MED ORDER — ONDANSETRON 4 MG PO TBDP: 4.0000 mg | ORAL_TABLET | Freq: Three times a day (TID) | ORAL | Status: DC | PRN

## 2020-10-20 MED ORDER — SODIUM CHLORIDE 0.9 % IV SOLN
200.0000 mg | Freq: Once | INTRAVENOUS | Status: AC
  Administered 2020-10-20: 200 mg via INTRAVENOUS
  Filled 2020-10-20: qty 40

## 2020-10-20 MED ORDER — DEXAMETHASONE SODIUM PHOSPHATE 10 MG/ML IJ SOLN
6.0000 mg | INTRAMUSCULAR | Status: DC
  Administered 2020-10-20 – 2020-10-24 (×5): 6 mg via INTRAVENOUS
  Filled 2020-10-20 (×5): qty 1

## 2020-10-20 MED ORDER — GUAIFENESIN-DM 100-10 MG/5ML PO SYRP
10.0000 mL | ORAL_SOLUTION | ORAL | Status: DC | PRN
  Administered 2020-10-20 – 2020-10-21 (×3): 10 mL via ORAL
  Filled 2020-10-20 (×4): qty 10

## 2020-10-20 MED ORDER — ASCORBIC ACID 500 MG PO TABS
500.0000 mg | ORAL_TABLET | Freq: Every day | ORAL | Status: DC
  Administered 2020-10-20 – 2020-10-24 (×5): 500 mg via ORAL
  Filled 2020-10-20 (×5): qty 1

## 2020-10-20 MED ORDER — METHYLPREDNISOLONE SODIUM SUCC 125 MG IJ SOLR: 125.0000 mg | Freq: Once | INTRAMUSCULAR | Status: DC | PRN

## 2020-10-20 MED ORDER — ALBUTEROL SULFATE HFA 108 (90 BASE) MCG/ACT IN AERS: 2.0000 | INHALATION_SPRAY | Freq: Once | RESPIRATORY_TRACT | Status: DC | PRN

## 2020-10-20 MED ORDER — SODIUM CHLORIDE 0.9 % IV SOLN
100.0000 mg | Freq: Every day | INTRAVENOUS | Status: AC
  Administered 2020-10-21 – 2020-10-24 (×4): 100 mg via INTRAVENOUS
  Filled 2020-10-20 (×4): qty 20

## 2020-10-20 MED ORDER — POTASSIUM CHLORIDE CRYS ER 20 MEQ PO TBCR
40.0000 meq | EXTENDED_RELEASE_TABLET | Freq: Once | ORAL | Status: AC
  Administered 2020-10-20: 40 meq via ORAL
  Filled 2020-10-20: qty 2

## 2020-10-20 MED ORDER — SODIUM CHLORIDE 0.9 % IV SOLN: INTRAVENOUS | Status: DC | PRN

## 2020-10-20 MED ORDER — FAMOTIDINE IN NACL 20-0.9 MG/50ML-% IV SOLN: 20.0000 mg | Freq: Once | INTRAVENOUS | Status: DC | PRN

## 2020-10-20 MED ORDER — EPINEPHRINE 0.3 MG/0.3ML IJ SOAJ
0.3000 mg | Freq: Once | INTRAMUSCULAR | Status: DC | PRN
  Filled 2020-10-20: qty 0.6

## 2020-10-20 MED ORDER — OXYCODONE-ACETAMINOPHEN 5-325 MG PO TABS
1.0000 | ORAL_TABLET | Freq: Four times a day (QID) | ORAL | Status: DC | PRN
  Administered 2020-10-20 – 2020-10-23 (×5): 2 via ORAL
  Filled 2020-10-20 (×5): qty 2

## 2020-10-20 MED ORDER — SODIUM CHLORIDE 0.9 % IV SOLN
Freq: Once | INTRAVENOUS | Status: AC
  Filled 2020-10-20: qty 20

## 2020-10-20 NOTE — H&P (Signed)
Nicole Case is a 19 y.o. female G1P0 at [redacted]w[redacted]d presenting with covid pneumonia. Patient reports a one week history of cough, generalized fatigue, body aches, decreased appetite and nausea. She reports headaches, fever and chills on the day prior to admission which prompted her visit to the ED. Patient with prenatal care at [facility] complicated by sickle cell anemia. She reports good fetal movement and denies contractions or vaginal bleeding.   OB History    Gravida  1   Para      Term      Preterm      AB      Living        SAB      TAB      Ectopic      Multiple      Live Births             Past Medical History:  Diagnosis Date  . Anxiety   . Sickle cell anemia (HCC)   . Sickle cell anemia (HCC)    Past Surgical History:  Procedure Laterality Date  . NO PAST SURGERIES     Family History: family history includes Sickle cell anemia in her brother. Social History:  reports that she has never smoked. She has never used smokeless tobacco. She reports previous drug use. Drug: Marijuana. She reports that she does not drink alcohol.     Maternal Diabetes: No Genetic Screening: Normal Maternal Ultrasounds/Referrals: Normal Fetal Ultrasounds or other Referrals:  None Maternal Substance Abuse:  No Significant Maternal Medications:  Meds include: Other: chronic use of oxycodone for the management of sickle cell pain Significant Maternal Lab Results:  None Other Comments:  None  Review of Systems  See pertinent in HPI History   Blood pressure 110/63, pulse (!) 119, temperature 98.8 F (37.1 C), temperature source Oral, resp. rate (!) 24, height 5\' 7"  (1.702 m), weight 97.5 kg, last menstrual period 04/10/2020, SpO2 99 %. Exam Physical Exam  GENERAL: Well-developed, well-nourished female in no acute distress.  LUNGS: Decreased breath sounds bilaterally HEART: Regular rate and rhythm. ABDOMEN: Soft, nontender, gravid EXTREMITIES: No cyanosis, clubbing, or edema,  2+ distal pulses.  FHT: baseline 150, mod variability, +accels, no decels Toco: no contractions  Prenatal labs: ABO, Rh: --/--/A POS (11/17 0350) Antibody: NEG (11/17 0350) Rubella: 2.94 (07/27 1151) RPR: Non Reactive (07/27 1151)  HBsAg: Negative (07/27 1151)  HIV: Non Reactive (07/27 1151)  GBS:     Assessment/Plan: 18 yo P0 at [redacted]w[redacted]d with covid pneumonia - Will admit for observation - Monoclonal antibody candidate - Will consult with medicine for further interventios as needed -  Routine antepartum care   [name] 10/20/2020, 7:44 AM

## 2020-10-20 NOTE — Progress Notes (Signed)
EKG strip reviewed and agree with documentation of values.  Pt. On telemetry box [id number].

## 2020-10-20 NOTE — Consult Note (Addendum)
[facility] Medical Consultation  Nicole Case GDJ:[id number] DOB: [birth year] DOA: 10/19/2020 PCP: Patient, No Pcp Per   Requesting physician: Dr Elly Modena Date of consultation: 10/20/20 Reason for consultation: Pneumonia due to COVID-19 virus  Impression/Recommendations Principal Problem:   Pneumonia due to COVID-19 virus Active Problems:   Hypokalemia   Normal IUP (intrauterine pregnancy) on prenatal ultrasound, third trimester    1. Pneumonia due to COVID-19 virus Patient is unvaccinated against the COVID-19 virus and presents with a 1 week history of a cough productive of yellow phlegm, fever and chills, myalgias, anorexia, shortness of breath and diarrhea. Her COVID-19 PCR test was positive on 10/19/20 Chest x-ray shows patchy bilateral airspace opacities, greatest in the right midlung concerning for pneumonia. She has a room air pulse oximetry of 99%  She already received  monoclonal antibody No indication for systemic steroids at this time since patient is not hypoxic and has room air pulse oximetry of 99% Will obtain procalcitonin levels to to rule out superimposed bacterial pneumonia and will place patient on antibiotic therapy if procalcitonin levels are elevated Continue supportive care with bronchodilators, antitussives and vitamins. Check inflammatory markers   2.  Hypokalemia Related to GI loss from diarrhea Supplement potassium Check magnesium levels   3.  Normal intrauterine pregnancy Further management per obstetrics   I will followup again tomorrow. Please contact me if I can be of assistance in the meanwhile. Thank you for this consultation.  Chief Complaint: Fever  HPI:  Patient is a 19 year old African-American female who is 27 weeks and 4 days pregnant with a past medical history significant for anxiety disorder, sickle cell anemia and obesity who presents to the emergency room for evaluation of fever.  In the ER she had a T-max of 101.2,  she was tachypneic and tachycardic. She is unvaccinated against the COVID-19 virus and in addition to a 1 week history of intermittent fever/chills, she also complains of myalgias, nausea, vomiting, diarrhea, cough productive of yellow phlegm and shortness of breath.  She has been on over-the-counter medications for her symptoms with no improvement.  She denies having any abdominal pain, no vaginal bleeding or discharge, no frequency of urination, no dysuria, no headaches, no chest pain Labs showed sodium of 133, potassium 3.3, chloride 100, bicarb 20, glucose 112, BUN less than 5, creatinine 0.61, calcium 8.5, alkaline phosphatase 96, albumin 3.4, lipase 17, AST 29, ALT 15, total protein 7.8, white count 5.0, hemoglobin 9.8, hematocrit 26.6, MCV 70.6, RDW 21.2, platelet count 129 COVID-19 PCR test is positive (10/19/20) Patient is currently admitted to the obstetrics/gynecology service   Review of Systems:  Essentially negative except for that listed in the history of present illness  Past Medical History:  Diagnosis Date  . Anxiety   . Sickle cell anemia (HCC)    Past Surgical History:  Procedure Laterality Date  . NO PAST SURGERIES     Social History:  reports that she has never smoked. She has never used smokeless tobacco. She reports previous drug use. Drug: Marijuana. She reports that she does not drink alcohol.  No Known Allergies Family History  Problem Relation Age of Onset  . Sickle cell anemia Brother     Prior to Admission medications   Medication Sig Start Date End Date Taking? Authorizing Provider  aspirin 81 MG chewable tablet Chew 1 tablet (81 mg total) by mouth daily. 06/29/20  Yes [name], CNM  Doxylamine-Pyridoxine (DICLEGIS) 10-10 MG TBEC Take 2 tablets by mouth at bedtime. Patient taking  differently: Take 2 tablets by mouth daily as needed (nausea & vomiting).  06/29/20  Yes [name], CNM  oxyCODONE (ROXICODONE) 5 MG immediate release tablet Take 1  tablet (5 mg total) by mouth every 4 (four) hours as needed for severe pain. 06/22/20  Yes [name], FNP  Prenatal Vit-Fe Fumarate-FA (PRENATAL VITAMIN PO) Take 2 tablets by mouth daily.    Yes [provider]  acetaminophen (TYLENOL) 325 MG tablet Take 2 tablets (650 mg total) by mouth every 6 (six) hours as needed for mild pain, moderate pain, fever or headache. Patient not taking: Reported on 05/10/2020 10/08/19   [name], FNP  amoxicillin-clavulanate (AUGMENTIN) 875-125 MG tablet Take 1 tablet by mouth every 12 (twelve) hours. Patient not taking: Reported on 09/21/2020 09/08/20   Kinnie Feil, PA-C  Blood Pressure Monitoring (BLOOD PRESSURE KIT) DEVI 1 kit by Does not apply route once a week. Check Blood Pressure regularly and record readings into the Babyscripts App.  Large Cuff.  DX O90.0 Patient not taking: Reported on 09/23/2020 09/21/20   Cephas Darby, MD  ferrous sulfate 325 (65 FE) MG tablet Take 1 tablet (325 mg total) by mouth 2 (two) times daily with a meal. Patient not taking: Reported on 09/08/2020 07/03/20   [name], CNM  ondansetron (ZOFRAN) 4 MG tablet Take 1 tablet (4 mg total) by mouth daily as needed for nausea or vomiting. Patient not taking: Reported on 06/29/2020 06/22/20 06/22/21  [name], FNP   Physical Exam: Blood pressure 110/63, pulse (!) 119, temperature 98.8 F (37.1 C), temperature source Oral, resp. rate (!) 24, height $RemoveBe'5\' 7"'NHOgdbszG$  (1.702 m), weight 97.5 kg, last menstrual period 04/10/2020, SpO2 99 %. Vitals:   10/20/20 0604 10/20/20 0611  BP:    Pulse:    Resp:    Temp:    SpO2: 97% 99%     General: Acutely ill-appearing, obese  Eyes: Pale conjunctiva  ENT: Moist mucous membranes  Neck: Supple, no JVD  Cardiovascular: Tachycardic  Respiratory: Bilateral air entry  Abdomen: Bowel sounds are present, nontender nondistended  Skin: Warm and dry  Musculoskeletal: Normal ROM  Psychiatric: Flat  affect  Neurologic: Able to move all extremities  Labs on Admission:  Basic Metabolic Panel: Recent Labs  Lab 10/19/20 1643  NA 133*  K 3.3*  CL 100  CO2 20*  GLUCOSE 112*  BUN <5*  CREATININE 0.61  CALCIUM 8.5*   Liver Function Tests: Recent Labs  Lab 10/19/20 1643  AST 29  ALT 15  ALKPHOS 96  BILITOT 2.0*  PROT 7.8  ALBUMIN 3.4*   Recent Labs  Lab 10/19/20 1643  LIPASE 17   No results for input(s): AMMONIA in the last 168 hours. CBC: Recent Labs  Lab 10/19/20 1643  WBC 5.0  NEUTROABS 3.7  HGB 9.2*  HCT 26.6*  MCV 70.6*  PLT 129*   Cardiac Enzymes: No results for input(s): CKTOTAL, CKMB, CKMBINDEX, TROPONINI in the last 168 hours. BNP: Invalid input(s): POCBNP CBG: No results for input(s): GLUCAP in the last 168 hours.  Radiological Exams on Admission: DG Chest Port 1 View  Result Date: 10/19/2020 CLINICAL DATA:  Fever EXAM: PORTABLE CHEST 1 VIEW COMPARISON:  09/08/2020 FINDINGS: Airspace opacity noted in the right mid lung compatible with pneumonia. Probable patchy left mid to lower lung opacity. Low lung volumes. Heart is normal size. No effusions or pneumothorax. No acute bony abnormality. IMPRESSION: Patchy bilateral airspace opacities, greatest in the right mid lung  concerning for pneumonia. Electronically Signed   By: Rolm Baptise M.D.   On: 10/19/2020 17:22    EKG: Independently reviewed.   Time spent: 48  Melitza Metheny [facility] Pager [phone]  If 7PM-7AM, please contact night-coverage [email or web address] Password [facility] 10/20/2020, 8:34 AM

## 2020-10-20 NOTE — Progress Notes (Signed)
Received a call from Dr Vergie Living from [facility] who informs me the patient is now requiring 1 to 2 L of oxygen via nasal cannula to maintain pulse oximetry in the high 90s. We will start patient on Decadron 6 mg IV daily as well as Remdesivir per protocol

## 2020-10-21 ENCOUNTER — Ambulatory Visit: Payer: Medicaid Other

## 2020-10-21 ENCOUNTER — Encounter: Payer: Medicaid Other | Admitting: Obstetrics and Gynecology

## 2020-10-21 DIAGNOSIS — J1282 Pneumonia due to coronavirus disease 2019: Secondary | ICD-10-CM | POA: Diagnosis not present

## 2020-10-21 DIAGNOSIS — D6489 Other specified anemias: Secondary | ICD-10-CM | POA: Diagnosis not present

## 2020-10-21 DIAGNOSIS — O99012 Anemia complicating pregnancy, second trimester: Secondary | ICD-10-CM | POA: Diagnosis not present

## 2020-10-21 DIAGNOSIS — O98512 Other viral diseases complicating pregnancy, second trimester: Secondary | ICD-10-CM | POA: Diagnosis not present

## 2020-10-21 DIAGNOSIS — Z3A27 27 weeks gestation of pregnancy: Secondary | ICD-10-CM | POA: Diagnosis not present

## 2020-10-21 DIAGNOSIS — D696 Thrombocytopenia, unspecified: Secondary | ICD-10-CM | POA: Diagnosis present

## 2020-10-21 DIAGNOSIS — D571 Sickle-cell disease without crisis: Secondary | ICD-10-CM | POA: Diagnosis not present

## 2020-10-21 DIAGNOSIS — Z3493 Encounter for supervision of normal pregnancy, unspecified, third trimester: Secondary | ICD-10-CM | POA: Diagnosis not present

## 2020-10-21 DIAGNOSIS — U071 COVID-19: Secondary | ICD-10-CM | POA: Diagnosis not present

## 2020-10-21 LAB — COMPREHENSIVE METABOLIC PANEL
ALT: 14 U/L (ref 0–44)
AST: 24 U/L (ref 15–41)
Albumin: 2.4 g/dL — ABNORMAL LOW (ref 3.5–5.0)
Alkaline Phosphatase: 70 U/L (ref 38–126)
Anion gap: 9 (ref 5–15)
BUN: <5 mg/dL — ABNORMAL LOW (ref 6–20)
CO2: 23 mmol/L (ref 22–32)
Calcium: 8.3 mg/dL — ABNORMAL LOW (ref 8.9–10.3)
Chloride: 105 mmol/L (ref 98–111)
Creatinine, Ser: 0.47 mg/dL (ref 0.44–1.00)
GFR, Estimated: >60 mL/min (ref >60)
Glucose, Bld: 113 mg/dL — ABNORMAL HIGH (ref 70–99)
Potassium: 3.3 mmol/L — ABNORMAL LOW (ref 3.5–5.1)
Sodium: 137 mmol/L (ref 135–145)
Total Bilirubin: 1.5 mg/dL — ABNORMAL HIGH (ref 0.3–1.2)
Total Protein: 6 g/dL — ABNORMAL LOW (ref 6.5–8.1)

## 2020-10-21 LAB — PHOSPHORUS: Phosphorus: 3.5 mg/dL (ref 2.5–4.6)

## 2020-10-21 LAB — MAGNESIUM: Magnesium: 1.7 mg/dL (ref 1.7–2.4)

## 2020-10-21 LAB — FERRITIN: Ferritin: 46 ng/mL (ref 11–307)

## 2020-10-21 LAB — CBC WITH DIFFERENTIAL/PLATELET
Abs Immature Granulocytes: 0.13 10*3/uL — ABNORMAL HIGH (ref 0.00–0.07)
Basophils Absolute: 0 10*3/uL (ref 0.0–0.1)
Basophils Relative: 0 %
Eosinophils Absolute: 0 10*3/uL (ref 0.0–0.5)
Eosinophils Relative: 0 %
HCT: 21.2 % — ABNORMAL LOW (ref 36.0–46.0)
Hemoglobin: 7.1 g/dL — ABNORMAL LOW (ref 12.0–15.0)
Immature Granulocytes: 3 %
Lymphocytes Relative: 16 %
Lymphs Abs: 0.9 10*3/uL (ref 0.7–4.0)
MCH: 23.6 pg — ABNORMAL LOW (ref 26.0–34.0)
MCHC: 33.5 g/dL (ref 30.0–36.0)
MCV: 70.4 fL — ABNORMAL LOW (ref 80.0–100.0)
Monocytes Absolute: 0.4 10*3/uL (ref 0.1–1.0)
Monocytes Relative: 7 %
Neutro Abs: 3.9 10*3/uL (ref 1.7–7.7)
Neutrophils Relative %: 74 %
Platelets: 106 10*3/uL — ABNORMAL LOW (ref 150–400)
RBC: 3.01 MIL/uL — ABNORMAL LOW (ref 3.87–5.11)
RDW: 21.1 % — ABNORMAL HIGH (ref 11.5–15.5)
WBC: 5.3 10*3/uL (ref 4.0–10.5)
nRBC: 0.6 % — ABNORMAL HIGH (ref 0.0–0.2)

## 2020-10-21 LAB — VITAMIN D 25 HYDROXY (VIT D DEFICIENCY, FRACTURES): Vit D, 25-Hydroxy: 17.45 ng/mL — ABNORMAL LOW (ref 30–100)

## 2020-10-21 LAB — D-DIMER, QUANTITATIVE: D-Dimer, Quant: 1.61 ug/mL-FEU — ABNORMAL HIGH (ref 0.00–0.50)

## 2020-10-21 LAB — C-REACTIVE PROTEIN: CRP: 9 mg/dL — ABNORMAL HIGH (ref <1.0)

## 2020-10-21 MED ORDER — POTASSIUM CHLORIDE CRYS ER 20 MEQ PO TBCR
40.0000 meq | EXTENDED_RELEASE_TABLET | Freq: Once | ORAL | Status: AC
  Administered 2020-10-21: 40 meq via ORAL
  Filled 2020-10-21: qty 2

## 2020-10-21 MED ORDER — VITAMIN D 25 MCG (1000 UNIT) PO TABS
1000.0000 [IU] | ORAL_TABLET | Freq: Every day | ORAL | Status: DC
  Administered 2020-10-21: 1000 [IU] via ORAL
  Filled 2020-10-21 (×2): qty 1

## 2020-10-21 MED ORDER — PANTOPRAZOLE SODIUM 40 MG PO TBEC
40.0000 mg | DELAYED_RELEASE_TABLET | Freq: Every day | ORAL | Status: DC
  Administered 2020-10-21 – 2020-10-24 (×4): 40 mg via ORAL
  Filled 2020-10-21 (×4): qty 1

## 2020-10-21 MED ORDER — MAGNESIUM SULFATE 2 GM/50ML IV SOLN
2.0000 g | Freq: Once | INTRAVENOUS | Status: AC
  Administered 2020-10-21: 2 g via INTRAVENOUS
  Filled 2020-10-21: qty 50

## 2020-10-21 NOTE — Progress Notes (Signed)
PROGRESS NOTE                                                                             PROGRESS NOTE                                                                                                                                                                                                             Patient Demographics:    Nicole Case, is a 19 y.o. female, DOB - 30-Dec-2000, BJY:[id number]  Outpatient Primary MD for the patient is Patient, No Pcp Per    LOS - 2  Admit date - 10/19/2020    Chief Complaint  Patient presents with  . Cough  . Fever  . Nausea  . Emesis  . Hematemesis  . Migraine       Brief Narrative    Patient is a 19 year old African-American female who is 27 weeks and 4 days pregnant with a past medical history significant for anxiety disorder, sickle cell anemia and obesity who presents to the emergency room for evaluation of fever.  In the ER she had a T-max of 101.2, she was tachypneic and tachycardic. She is unvaccinated against the COVID-19 virus and in addition to a 1 week history of intermittent fever/chills, she also complains of myalgias, nausea, vomiting, diarrhea, cough productive of yellow phlegm and shortness of breath.  She has been on over-the-counter medications for her symptoms with no improvement.  She denies having any abdominal pain, no vaginal bleeding or discharge, no frequency of urination, no dysuria, no headaches, no chest pain Labs showed sodium of 133, potassium 3.3, chloride 100, bicarb 20, glucose 112, BUN less than 5, creatinine 0.61, calcium 8.5, alkaline phosphatase 96, albumin 3.4, lipase 17, AST 29, ALT 15, total protein 7.8, white count 5.0, hemoglobin 9.8, hematocrit 26.6, MCV 70.6, RDW 21.2, platelet count 129 COVID-19 PCR test is positive (10/19/20) Patient is currently admitted to the obstetrics/gynecology service    Subjective:    Nicole Case today reports some dyspnea,  cough, and generalized weakness,  denies chest pain, fever or chills.     Assessment  & Plan :    Principal Problem:   Pneumonia due to COVID-19 virus Active Problems:   Anemia   Hypokalemia   Normal IUP (intrauterine pregnancy) on prenatal ultrasound, third trimester   Thrombocytopenia affecting pregnancy (HCC)   COVID-19 infection/pneumonia  -This x-ray significant for pneumonia multifocal, but more pronounced in the right lung, this is due to COVID-19 infection ,  -  procalcitonin within normal range, no evidence of bacterial infection, no indication for antibiotics. -Required some oxygen overnight, but I think this is most likely due to atelectasis and poor inspiratory efforts, currently she is saturating well on room air. -Continue with Decadron. -Continue with remdesivir. -She received monoclonal antibody - Encouraged the patient to sit up in chair in the daytime use I-S and flutter valve for pulmonary toiletry and then prone in bed when at night.  Will advance activity and titrate down oxygen as possible.     SpO2: 96 % O2 Flow Rate (L/min): 2 L/min  Recent Labs  Lab 10/19/20 1643 10/19/20 1644 10/20/20 0845 10/21/20 0523  WBC 5.0  --   --  5.3  PLT 129*  --   --  106*  CRP  --   --  5.0* 9.0*  DDIMER  --   --  1.92* 1.61*  PROCALCITON  --   --  <0.10  --   AST 29  --   --  24  ALT 15  --   --  14  ALKPHOS 96  --   --  70  BILITOT 2.0*  --   --  1.5*  ALBUMIN 3.4*  --   --  2.4*  SARSCOV2NAA  --  POSITIVE*  --   --       Hypokalemia -Repleted, recheck in a.m..   Normal intrauterine pregnancy -Managed by primary OB service.  History of sickle cell disease [place] type. -Patient reports she used to follow-up at [facility], but did not follow-up for few years, reports she is not on any medications currently, hemoglobin dropped to 7.1 today, likely delusional per primary service, continue to monitor closely.  Vitamin D deficiency -Will start on  supplements  Condition - Extremely Guarded  Family Communication  :  D/W patient  Code Status :  full  Procedures  :  none  PUD Prophylaxis : protonix  Disposition Plan  :    Status is: Inpatient  Remains inpatient appropriate because:IV treatments appropriate due to intensity of illness or inability to take PO   Dispo: The patient is from: Home              Anticipated d/c is to: Home              Anticipated d/c date is: 3 days              Patient currently is not medically stable to d/c.      DVT Prophylaxis  :  Lovenox   Lab Results  Component Value Date   PLT 106 (L) 10/21/2020    Diet :  Diet Order            Diet regular Room service appropriate? Yes; Fluid consistency: Thin  Diet effective now                  Inpatient Medications  Scheduled Meds: . albuterol  2 puff Inhalation Q6H  . vitamin C  500 mg Oral Daily  .  dexamethasone (DECADRON) injection  6 mg Intravenous Q24H  . docusate sodium  100 mg Oral Daily  . enoxaparin (LOVENOX) injection  40 mg Subcutaneous Q24H  . pantoprazole  40 mg Oral Daily  . potassium chloride  40 mEq Oral Once  . prenatal multivitamin  1 tablet Oral Q1200  . zinc sulfate  220 mg Oral Daily   Continuous Infusions: . sodium chloride    . dextrose 5 % and 0.45% NaCl Stopped (10/20/20 0020)  . famotidine (PEPCID) IV    . lactated ringers 75 mL/hr at 10/21/20 0900  . remdesivir 100 mg in NS 100 mL     PRN Meds:.sodium chloride, acetaminophen, albuterol, calcium carbonate, diphenhydrAMINE, EPINEPHrine, famotidine (PEPCID) IV, guaiFENesin-dextromethorphan, methylPREDNISolone (SOLU-MEDROL) injection, ondansetron (ZOFRAN) IV, ondansetron, oxyCODONE-acetaminophen  Antibiotics  :    Anti-infectives (From admission, onward)   Start     Dose/Rate Route Frequency Ordered Stop   10/21/20 1200  remdesivir 100 mg in sodium chloride 0.9 % 100 mL IVPB        100 mg 200 mL/hr over 30 Minutes Intravenous Daily 10/20/20 1506  10/25/20 1159   10/20/20 1600  remdesivir 200 mg in sodium chloride 0.9% 250 mL IVPB        200 mg 580 mL/hr over 30 Minutes Intravenous Once 10/20/20 1506 10/20/20 1701        Alenah Sarria M.D on 10/21/2020 at 11:16 AM  To page go to [email or web address]   [facility] -  Office  [phone]    Objective:   Vitals:   10/21/20 0810 10/21/20 0814 10/21/20 0815 10/21/20 0816  BP:  114/64    Pulse:  (!) 115    Resp:  (!) 32    Temp:  98.1 F (36.7 C)    TempSrc:      SpO2: 100% 96% 96% 96%  Weight:      Height:        Wt Readings from Last 3 Encounters:  10/19/20 97.5 kg  09/21/20 123.8 kg  06/29/20 (!) 120.7 kg     Intake/Output Summary (Last 24 hours) at 10/21/2020 1116 Last data filed at 10/21/2020 1000 Gross per 24 hour  Intake 2888.56 ml  Output 700 ml  Net 2188.56 ml     Physical Exam  Awake Alert, No new F.N deficits, Normal affect Symmetrical Chest wall movement, diminished air entry at the bases RRR,No Gallops,Rubs or new Murmurs, No Parasternal Heave +ve B.Sounds, Abd Soft No Cyanosis, Clubbing or edema, No new Rash or bruise      Data Review:    CBC Recent Labs  Lab 10/19/20 1643 10/21/20 0523  WBC 5.0 5.3  HGB 9.2* 7.1*  HCT 26.6* 21.2*  PLT 129* 106*  MCV 70.6* 70.4*  MCH 24.4* 23.6*  MCHC 34.6 33.5  RDW 21.2* 21.1*  LYMPHSABS 0.8 0.9  MONOABS 0.5 0.4  EOSABS 0.0 0.0  BASOSABS 0.0 0.0    Recent Labs  Lab 10/19/20 1643 10/20/20 0845 10/21/20 0523  NA 133*  --  137  K 3.3*  --  3.3*  CL 100  --  105  CO2 20*  --  23  GLUCOSE 112*  --  113*  BUN <5*  --  <5*  CREATININE 0.61  --  0.47  CALCIUM 8.5*  --  8.3*  AST 29  --  24  ALT 15  --  14  ALKPHOS 96  --  70  BILITOT 2.0*  --  1.5*  ALBUMIN 3.4*  --  2.4*  MG  --   --  1.7  CRP  --  5.0* 9.0*  DDIMER  --  1.92* 1.61*  PROCALCITON  --  <0.10  --      ------------------------------------------------------------------------------------------------------------------ No results for input(s): CHOL, HDL, LDLCALC, TRIG, CHOLHDL, LDLDIRECT in the last 72 hours.  Lab Results  Component Value Date   HGBA1C <4.2 (L) 06/29/2020   ------------------------------------------------------------------------------------------------------------------ No results for input(s): TSH, T4TOTAL, T3FREE, THYROIDAB in the last 72 hours.  Invalid input(s): FREET3  Cardiac Enzymes No results for input(s): CKMB, TROPONINI, MYOGLOBIN in the last 168 hours.  Invalid input(s): CK ------------------------------------------------------------------------------------------------------------------ No results found for: BNP  Micro Results Recent Results (from the past 240 hour(s))  Resp Panel by RT PCR (RSV, Flu A&B, Covid) - Nasopharyngeal Swab     Status: Abnormal   Collection Time: 10/19/20  4:44 PM   Specimen: Nasopharyngeal Swab  Result Value Ref Range Status   SARS Coronavirus 2 by RT PCR POSITIVE (A) NEGATIVE Final    Comment: RESULT CALLED TO, READ BACK BY AND VERIFIED WITH: JACOBS,D. RN @1846  10/19/20 BILLINGSLEY,L (NOTE) SARS-CoV-2 target nucleic acids are DETECTED.  SARS-CoV-2 RNA is generally detectable in upper respiratory specimens  during the acute phase of infection. Positive results are indicative of the presence of the identified virus, but do not rule out bacterial infection or co-infection with other pathogens not detected by the test. Clinical correlation with patient history and other diagnostic information is necessary to determine patient infection status. The expected result is Negative.  Fact Sheet for Patients:  10/21/20  Fact Sheet for Healthcare Providers: https://www.moore.com/  This test is not yet approved or cleared by the https://www.young.biz/ FDA and  has been authorized  for detection and/or diagnosis of SARS-CoV-2 by FDA under an Emergency Use Authorization (EUA).  This EUA will remain in effect (meaning this test  can be used) for the duration of  the COVID-19 declaration under Section 564(b)(1) of the Act, 21 U.S.C. section 360bbb-3(b)(1), unless the authorization is terminated or revoked sooner.      Influenza A by PCR NEGATIVE NEGATIVE Final   Influenza B by PCR NEGATIVE NEGATIVE Final    Comment: (NOTE) The Xpert Xpress SARS-CoV-2/FLU/RSV assay is intended as an aid in  the diagnosis of influenza from Nasopharyngeal swab specimens and  should not be used as a sole basis for treatment. Nasal washings and  aspirates are unacceptable for Xpert Xpress SARS-CoV-2/FLU/RSV  testing.  Fact Sheet for Patients: [place]  Fact Sheet for Healthcare Providers: https://www.moore.com/  This test is not yet approved or cleared by the https://www.young.biz/ FDA and  has been authorized for detection and/or diagnosis of SARS-CoV-2 by  FDA under an Emergency Use Authorization (EUA). This EUA will remain  in effect (meaning this test can be used) for the duration of the  Covid-19 declaration under Section 564(b)(1) of the Act, 21  U.S.C. section 360bbb-3(b)(1), unless the authorization is  terminated or revoked.    Respiratory Syncytial Virus by PCR NEGATIVE NEGATIVE Final    Comment: (NOTE) Fact Sheet for Patients: [place]  Fact Sheet for Healthcare Providers: https://www.moore.com/  This test is not yet approved or cleared by the https://www.young.biz/ FDA and  has been authorized for detection and/or diagnosis of SARS-CoV-2 by  FDA under an Emergency Use Authorization (EUA). This EUA will remain  in effect (meaning this test can be used) for the duration of the  COVID-19 declaration under Section 564(b)(1) of the Act, 21 U.S.C.  section 360bbb-3(b)(1), unless the  authorization is terminated or  revoked. Performed at [facility], [address] [place], [place] [address]     Radiology Reports DG Chest Port 1 View  Result Date: 10/19/2020 CLINICAL DATA:  Fever EXAM: PORTABLE CHEST 1 VIEW COMPARISON:  09/08/2020 FINDINGS: Airspace opacity noted in the right mid lung compatible with pneumonia. Probable patchy left mid to lower lung opacity. Low lung volumes. Heart is normal size. No effusions or pneumothorax. No acute bony abnormality. IMPRESSION: Patchy bilateral airspace opacities, greatest in the right mid lung concerning for pneumonia. Electronically Signed   By: Charlett Nose M.D.   On: 10/19/2020 17:22

## 2020-10-21 NOTE — Progress Notes (Signed)
Daily Antepartum Note  Admission Date: 10/19/2020 Current Date: 10/21/2020 10:42 AM  Nicole Case is a 19 y.o. G1P0 @ [redacted]w[redacted]d, HD#3, admitted for covid PNA.  Pregnancy complicated by: Patient Active Problem List   Diagnosis Date Noted  . Hypokalemia 10/20/2020  . Normal IUP (intrauterine pregnancy) on prenatal ultrasound, third trimester 10/20/2020  . Pneumonia due to COVID-19 virus 10/19/2020  . 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 Carrier of genetic disorder 07/10/2020  . 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 Trichomonal vaginitis during pregnancy in first trimester 07/03/2020  . Sickle cell anemia of mother during pregnancy (HCC) 07/03/2020  . Maternal morbid obesity, antepartum (HCC) 06/29/2020  . Supervision of high risk pregnancy, antepartum 06/28/2020  . Central chest pain   . Sickle cell anemia with crisis (HCC) 06/20/2020  . Sore throat 10/06/2019  . Fever 10/06/2019  . Urinary tract infection symptoms 10/06/2019  . Sickle-cell disease with pain (HCC) 08/01/2017  . Sickle cell disease, type [place], with unspecified crisis (HCC) 07/31/2017  . Sickle cell anemia with pain (HCC) 05/28/2016  . 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 Sickle cell pain crisis (HCC) 01/29/2014  . Community acquired pneumonia 12/12/2011  . Sickle cell pain crisis 12/12/2011    Overnight/24hr events:  Started on steroids and remdesivir due to [place] O2 requirement  Subjective:  Chest discomfort, sob stable  No OB complaints  Objective:    Current Vital Signs 24h Vital Sign Ranges  T 98.1 F (36.7 C) Temp  Avg: 98.6 F (37 C)  Min: 98 F (36.7 C)  Max: 99.6 F (37.6 C)  BP 114/64 BP  Min: 96/69  Max: 114/64  HR (!) 115 Pulse  Avg: 117.6  Min: 105  Max: 131  RR (!) 32 Resp  Avg: 24.7  Min: 20  Max: 32  SaO2 96 % Nasal Cannula SpO2  Avg: 96.5 %  Min: 92 %  Max: 100 %       24 Hour I/O Current Shift I/O  Time Ins Outs 11/17 0701 - 11/18 0700 In: 3176.6  [P.O.:1505; I.V.:1279.3] Out: 550 [Urine:550] 11/18 0701 - 11/18 1900 In: 319.6 [I.V.:319.6] Out: 400    Patient Vitals for the past 24 hrs:  BP Temp Temp src Pulse Resp SpO2  10/21/20 0816 -- -- -- -- -- 96 %  10/21/20 0815 -- -- -- -- -- 96 %  10/21/20 0814 114/64 98.1 F (36.7 C) -- (!) 115 (!) 32 96 %  10/21/20 0810 -- -- -- -- -- 100 %  10/21/20 0518 109/65 98.1 F (36.7 C) Oral (!) 105 20 98 %  10/21/20 0500 -- -- -- -- -- 96 %  10/21/20 0400 -- -- -- -- -- 100 %  10/21/20 0300 -- -- -- -- -- 97 %  10/21/20 0202 -- 98 F (36.7 C) Oral -- (!) 22 97 %  10/21/20 0200 -- -- -- -- -- 97 %  10/21/20 0100 -- -- -- -- -- 96 %  10/21/20 0000 -- -- -- -- -- 95 %  10/20/20 2340 -- -- -- -- -- 95 %  10/20/20 2300 -- -- -- -- -- 94 %  10/20/20 2100 -- -- -- -- -- 95 %  10/20/20 2000 -- -- -- -- -- 98 %  10/20/20 1958 96/69 98.7 F (37.1 C) Oral (!) 117 (!) 26 98 %  10/20/20 1805 -- -- -- -- -- 96 %  10/20/20 1800 -- -- -- -- --  96 %  10/20/20 1755 -- -- -- -- -- 97 %  10/20/20 1751 -- -- -- -- -- 97 %  10/20/20 1745 -- -- -- -- -- 95 %  10/20/20 1740 -- -- -- -- -- 95 %  10/20/20 1735 -- -- -- -- -- 95 %  10/20/20 1730 -- -- -- -- -- 94 %  10/20/20 1725 -- -- -- -- -- 94 %  10/20/20 1720 -- -- -- -- -- 94 %  10/20/20 1715 -- -- -- -- -- 94 %  10/20/20 1710 -- -- -- -- -- 95 %  10/20/20 1705 -- -- -- -- -- 95 %  10/20/20 1700 -- -- -- -- -- 95 %  10/20/20 1655 -- -- -- -- -- 96 %  10/20/20 1650 -- -- -- -- -- 96 %  10/20/20 1649 (!) 103/51 99.6 F (37.6 C) Oral (!) 131 (!) 24 --  10/20/20 1645 -- -- -- -- -- 97 %  10/20/20 1640 -- -- -- -- -- 97 %  10/20/20 1635 -- -- -- -- -- 97 %  10/20/20 1630 -- -- -- -- -- 99 %  10/20/20 1625 -- -- -- -- -- 98 %  10/20/20 1620 -- -- -- -- -- 96 %  10/20/20 1615 -- -- -- -- -- 97 %  10/20/20 1610 -- -- -- -- -- 98 %  10/20/20 1605 -- -- -- -- -- 94 %  10/20/20 1450 -- -- -- -- -- 98 %  10/20/20 1445 -- -- -- -- -- 98 %  10/20/20  1440 -- -- -- -- -- 98 %  10/20/20 1435 -- -- -- -- -- 98 %  10/20/20 1431 -- -- -- -- -- 98 %  10/20/20 1425 -- -- -- -- -- 98 %  10/20/20 1420 -- -- -- -- -- 97 %  10/20/20 1416 -- -- -- -- -- 97 %  10/20/20 1410 -- -- -- -- -- 97 %  10/20/20 1405 -- -- -- -- -- 97 %  10/20/20 1400 -- -- -- -- -- 96 %  10/20/20 1355 -- -- -- -- -- 96 %  10/20/20 1350 -- -- -- -- -- 100 %  10/20/20 1345 -- -- -- -- -- 100 %  10/20/20 1341 -- -- -- -- -- 100 %  10/20/20 1335 -- -- -- -- -- 98 %  10/20/20 1330 -- -- -- -- -- 98 %  10/20/20 1325 -- -- -- -- -- 98 %  10/20/20 1320 -- -- -- -- -- 98 %  10/20/20 1315 -- -- -- -- -- 98 %  10/20/20 1310 -- -- -- -- -- 99 %  10/20/20 1306 -- -- -- -- -- 98 %  10/20/20 1300 -- -- -- -- -- 98 %  10/20/20 1255 -- -- -- -- -- 98 %  10/20/20 1250 -- -- -- -- -- 98 %  10/20/20 1245 -- -- -- -- -- 97 %  10/20/20 1240 -- 99.2 F (37.3 C) -- -- -- 95 %  10/20/20 1215 -- -- -- -- -- 93 %  10/20/20 1210 -- -- -- -- -- 95 %  10/20/20 1206 -- -- -- -- -- 94 %  10/20/20 1200 -- -- -- -- -- 92 %  10/20/20 1155 -- -- -- -- -- 92 %  10/20/20 1150 -- -- -- -- -- 95 %  10/20/20 1140 -- -- -- -- -- 95 %  10/20/20 1136 -- -- -- -- -- 93 %  10/20/20 1130 -- -- -- -- -- 95 %  10/20/20 1125 -- -- -- -- -- 96 %  10/20/20 1120 -- -- -- -- -- 96 %  10/20/20 1115 -- -- -- -- -- 95 %  10/20/20 1114 111/64 98.3 F (36.8 C) Oral (!) 120 (!) 24 --   FHT: 160 baseline, +accels, no decel, mod variability Toco: quiet x 1 hour  Physical exam: General: Well nourished, well developed female in no acute distress. Abdomen: gravid nttp Respiratory: no resp distress Skin: Warm and dry.   Medications: Current Facility-Administered Medications  Medication Dose Route Frequency Provider Last Rate Last Admin  . 0.9 %  sodium chloride infusion   Intravenous PRN Constant, Peggy, MD      . acetaminophen (TYLENOL) tablet 650 mg  650 mg Oral Q4H PRN Constant, Peggy, MD   650 mg at  10/21/20 0820  . albuterol (VENTOLIN HFA) 108 (90 Base) MCG/ACT inhaler 2 puff  2 puff Inhalation Once PRN Constant, Peggy, MD      . albuterol (VENTOLIN HFA) 108 (90 Base) MCG/ACT inhaler 2 puff  2 puff Inhalation Q6H Agbata, Tochukwu, MD   2 puff at 10/21/20 0807  . ascorbic acid (VITAMIN C) tablet 500 mg  500 mg Oral Daily Agbata, Tochukwu, MD   500 mg at 10/21/20 0946  . calcium carbonate (TUMS - dosed in mg elemental calcium) chewable tablet 400 mg of elemental calcium  2 tablet Oral Q4H PRN Constant, Peggy, MD      . dexamethasone (DECADRON) injection 6 mg  6 mg Intravenous Q24H Agbata, Tochukwu, MD   6 mg at 10/20/20 1607  . dextrose 5 %-0.45 % sodium chloride infusion   Intravenous Continuous Gerhard Munch, MD   Stopped at 10/20/20 0020  . diphenhydrAMINE (BENADRYL) injection 50 mg  50 mg Intravenous Once PRN Constant, Peggy, MD      . docusate sodium (COLACE) capsule 100 mg  100 mg Oral Daily Constant, Peggy, MD      . enoxaparin (LOVENOX) injection 40 mg  40 mg Subcutaneous Q24H Constant, Peggy, MD   40 mg at 10/20/20 2210  . EPINEPHrine (EPI-PEN) injection 0.3 mg  0.3 mg Intramuscular Once PRN Constant, Peggy, MD      . famotidine (PEPCID) IVPB 20 mg premix  20 mg Intravenous Once PRN Constant, Peggy, MD      . guaiFENesin-dextromethorphan (ROBITUSSIN DM) 100-10 MG/5ML syrup 10 mL  10 mL Oral Q4H PRN Agbata, Tochukwu, MD   10 mL at 10/20/20 2002  . lactated ringers infusion   Intravenous Continuous Constant, Peggy, MD 75 mL/hr at 10/21/20 0900 Rate Verify at 10/21/20 0900  . magnesium sulfate IVPB 2 g 50 mL  2 g Intravenous Once [place] Bing, MD 50 mL/hr at 10/21/20 0951 2 g at 10/21/20 0951  . methylPREDNISolone sodium succinate (SOLU-MEDROL) 125 mg/2 mL injection 125 mg  125 mg Intravenous Once PRN Constant, Peggy, MD      . ondansetron (ZOFRAN) injection 4 mg  4 mg Intravenous Q6H PRN [place] Bing, MD   4 mg at 10/20/20 1041  . ondansetron (ZOFRAN-ODT) disintegrating tablet  4 mg  4 mg Oral Q8H PRN [place] Bing, MD      . oxyCODONE-acetaminophen (PERCOCET/ROXICET) 5-325 MG per tablet 1-2 tablet  1-2 tablet Oral Q6H PRN Constant, Peggy, MD   2 tablet at 10/20/20 2210  . pantoprazole (PROTONIX) EC tablet 40 mg  40 mg Oral Daily Elgergawy, Leana Roe, MD   40 mg at 10/21/20 0946  .  potassium chloride SA (KLOR-CON) CR tablet 40 mEq  40 mEq Oral Once Agbata, Tochukwu, MD      . prenatal multivitamin tablet 1 tablet  1 tablet Oral Q1200 Constant, Peggy, MD      . remdesivir 100 mg in sodium chloride 0.9 % 100 mL IVPB  100 mg Intravenous Daily Agbata, Tochukwu, MD      . zinc sulfate capsule 220 mg  220 mg Oral Daily Agbata, Tochukwu, MD   220 mg at 10/21/20 0946    Labs:  Recent Labs  Lab 10/19/20 1643 10/21/20 0523  WBC 5.0 5.3  HGB 9.2* 7.1*  HCT 26.6* 21.2*  PLT 129* 106*    Recent Labs  Lab 10/19/20 1643 10/21/20 0523  NA 133* 137  K 3.3* 3.3*  CL 100 105  CO2 20* 23  BUN <5* <5*  CREATININE 0.61 0.47  CALCIUM 8.5* 8.3*  PROT 7.8 6.0*  BILITOT 2.0* 1.5*  ALKPHOS 96 70  ALT 15 14  AST 29 24  GLUCOSE 112* 113*    Radiology:  No new imaging  Assessment & Plan:  Pt stable *Pregnancy: routine care. Reactive NST. qday NSTs *COVID PNA: co-managing with hospitalist service, appreciate recs. Pt 95-97% on RA when I was in the room this morning.  -s/p monoclonal AB on 11/17 *[place] disease/Heme: follow CBCs; likely dilutional.  *Preterm: no issues. NICU aware *PPx: lovenox, scds *FEN/GI: regular diet, MIVF per Hospitalist service. Electrolytes being replaced *Dispo: once stable on RA  Cornelia Copa MD Attending Center for [facility]) GYN Consult Phone: [phone] (M-F, [phone]) & [phone] (Off hours, weekends, holidays)

## 2020-10-22 DIAGNOSIS — O98512 Other viral diseases complicating pregnancy, second trimester: Secondary | ICD-10-CM | POA: Diagnosis not present

## 2020-10-22 DIAGNOSIS — D571 Sickle-cell disease without crisis: Secondary | ICD-10-CM | POA: Diagnosis not present

## 2020-10-22 DIAGNOSIS — D6489 Other specified anemias: Secondary | ICD-10-CM | POA: Diagnosis not present

## 2020-10-22 DIAGNOSIS — Z3493 Encounter for supervision of normal pregnancy, unspecified, third trimester: Secondary | ICD-10-CM | POA: Diagnosis not present

## 2020-10-22 DIAGNOSIS — Z3A27 27 weeks gestation of pregnancy: Secondary | ICD-10-CM | POA: Diagnosis not present

## 2020-10-22 DIAGNOSIS — O99012 Anemia complicating pregnancy, second trimester: Secondary | ICD-10-CM | POA: Diagnosis not present

## 2020-10-22 DIAGNOSIS — J1282 Pneumonia due to coronavirus disease 2019: Secondary | ICD-10-CM | POA: Diagnosis not present

## 2020-10-22 DIAGNOSIS — U071 COVID-19: Secondary | ICD-10-CM | POA: Diagnosis not present

## 2020-10-22 LAB — CBC WITH DIFFERENTIAL/PLATELET
Abs Immature Granulocytes: 0.3 10*3/uL — ABNORMAL HIGH (ref 0.00–0.07)
Basophils Absolute: 0 10*3/uL (ref 0.0–0.1)
Basophils Relative: 0 %
Eosinophils Absolute: 0 10*3/uL (ref 0.0–0.5)
Eosinophils Relative: 0 %
HCT: 21.6 % — ABNORMAL LOW (ref 36.0–46.0)
Hemoglobin: 7.3 g/dL — ABNORMAL LOW (ref 12.0–15.0)
Immature Granulocytes: 6 %
Lymphocytes Relative: 25 %
Lymphs Abs: 1.3 10*3/uL (ref 0.7–4.0)
MCH: 23.5 pg — ABNORMAL LOW (ref 26.0–34.0)
MCHC: 33.8 g/dL (ref 30.0–36.0)
MCV: 69.5 fL — ABNORMAL LOW (ref 80.0–100.0)
Monocytes Absolute: 0.5 10*3/uL (ref 0.1–1.0)
Monocytes Relative: 10 %
Neutro Abs: 3 10*3/uL (ref 1.7–7.7)
Neutrophils Relative %: 59 %
Platelets: 127 10*3/uL — ABNORMAL LOW (ref 150–400)
RBC: 3.11 MIL/uL — ABNORMAL LOW (ref 3.87–5.11)
RDW: 21.6 % — ABNORMAL HIGH (ref 11.5–15.5)
WBC: 5.1 10*3/uL (ref 4.0–10.5)
nRBC: 1.4 % — ABNORMAL HIGH (ref 0.0–0.2)

## 2020-10-22 LAB — D-DIMER, QUANTITATIVE: D-Dimer, Quant: 1.56 ug{FEU}/mL — ABNORMAL HIGH (ref 0.00–0.50)

## 2020-10-22 LAB — COMPREHENSIVE METABOLIC PANEL
ALT: 16 U/L (ref 0–44)
AST: 33 U/L (ref 15–41)
Albumin: 2.4 g/dL — ABNORMAL LOW (ref 3.5–5.0)
Alkaline Phosphatase: 80 U/L (ref 38–126)
Anion gap: 5 (ref 5–15)
BUN: <5 mg/dL — ABNORMAL LOW (ref 6–20)
CO2: 27 mmol/L (ref 22–32)
Calcium: 8.4 mg/dL — ABNORMAL LOW (ref 8.9–10.3)
Chloride: 107 mmol/L (ref 98–111)
Creatinine, Ser: 0.46 mg/dL (ref 0.44–1.00)
GFR, Estimated: >60 mL/min (ref >60)
Glucose, Bld: 116 mg/dL — ABNORMAL HIGH (ref 70–99)
Potassium: 4 mmol/L (ref 3.5–5.1)
Sodium: 139 mmol/L (ref 135–145)
Total Bilirubin: 1.4 mg/dL — ABNORMAL HIGH (ref 0.3–1.2)
Total Protein: 5.9 g/dL — ABNORMAL LOW (ref 6.5–8.1)

## 2020-10-22 LAB — MAGNESIUM: Magnesium: 1.9 mg/dL (ref 1.7–2.4)

## 2020-10-22 LAB — FERRITIN: Ferritin: 67 ng/mL (ref 11–307)

## 2020-10-22 LAB — PHOSPHORUS: Phosphorus: 4.1 mg/dL (ref 2.5–4.6)

## 2020-10-22 LAB — C-REACTIVE PROTEIN: CRP: 8 mg/dL — ABNORMAL HIGH

## 2020-10-22 MED ORDER — GUAIFENESIN-DM 100-10 MG/5ML PO SYRP
10.0000 mL | ORAL_SOLUTION | Freq: Two times a day (BID) | ORAL | Status: DC
  Administered 2020-10-22 – 2020-10-24 (×5): 10 mL via ORAL
  Filled 2020-10-22 (×5): qty 10

## 2020-10-22 MED ORDER — VITAMIN D (ERGOCALCIFEROL) 1.25 MG (50000 UNIT) PO CAPS
50000.0000 [IU] | ORAL_CAPSULE | ORAL | Status: DC
  Administered 2020-10-22: 50000 [IU] via ORAL
  Filled 2020-10-22: qty 1

## 2020-10-22 NOTE — Progress Notes (Signed)
Daily Antepartum Note  Admission Date: 10/19/2020 Current Date: 10/22/2020 9:30 AM  Nicole Case is a 19 y.o. G1P0 @ [redacted]w[redacted]d, HD#4, admitted for covid PNA.  Pregnancy complicated by: Patient Active Problem List   Diagnosis Date Noted  . Thrombocytopenia affecting pregnancy (HCC) 10/21/2020  . Hypokalemia 10/20/2020  . Normal IUP (intrauterine pregnancy) on prenatal ultrasound, third trimester 10/20/2020  . Pneumonia due to COVID-19 virus 10/19/2020  . 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 Carrier of genetic disorder 07/10/2020  . 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 Trichomonal vaginitis during pregnancy in first trimester 07/03/2020  . Anemia 07/03/2020  . Maternal morbid obesity, antepartum (HCC) 06/29/2020  . Supervision of high risk pregnancy, antepartum 06/28/2020  . Central chest pain   . Sickle cell anemia with crisis (HCC) 06/20/2020  . Sore throat 10/06/2019  . Fever 10/06/2019  . Urinary tract infection symptoms 10/06/2019  . Sickle-cell disease with pain (HCC) 08/01/2017  . Sickle cell disease, type Hodge, with unspecified crisis (HCC) 07/31/2017  . Sickle cell anemia with pain (HCC) 05/28/2016  . 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 Sickle cell pain crisis (HCC) 01/29/2014  . Community acquired pneumonia 12/12/2011  . Sickle cell pain crisis 12/12/2011    Overnight/24hr events:  None  Subjective:  Chest discomfort and sob improving. Having some nausea with emesis. Pt coughing more No OB complaints.   Objective:    Current Vital Signs 24h Vital Sign Ranges  T 97.8 F (36.6 C) Temp  Avg: 98 F (36.7 C)  Min: 97.6 F (36.4 C)  Max: 98.4 F (36.9 C)  BP 115/80 BP  Min: 95/46  Max: 115/80  HR (!) 108 Pulse  Avg: 108.3  Min: 86  Max: 124  RR 18 Resp  Avg: 24.4  Min: 18  Max: 30  SaO2 100 % RA SpO2  Avg: 99.4 %  Min: 92 %  Max: 100 %       24 Hour I/O Current Shift I/O  Time Ins Outs 11/18 0701 - 11/19 0700 In: 1932.3  [P.O.:1314; I.V.:468.2] Out: 2400 [Urine:2000] No intake/output data recorded.   Patient Vitals for the past 24 hrs:  BP Temp Temp src Pulse Resp SpO2  10/22/20 0816 115/80 97.8 F (36.6 C) Oral (!) 108 18 100 %  10/22/20 0805 -- -- -- -- -- 100 %  10/22/20 0800 -- -- -- -- -- 100 %  10/22/20 0755 -- -- -- -- -- 100 %  10/22/20 0750 -- -- -- -- -- 100 %  10/22/20 0745 -- -- -- -- -- 100 %  10/22/20 0740 -- -- -- -- -- 100 %  10/22/20 0735 -- -- -- -- -- 99 %  10/22/20 0730 -- -- -- -- -- 100 %  10/22/20 0725 -- -- -- -- -- 99 %  10/22/20 0720 -- -- -- -- -- 98 %  10/22/20 0715 -- -- -- -- -- 100 %  10/22/20 0710 -- -- -- -- -- 100 %  10/22/20 0705 -- -- -- -- -- 100 %  10/22/20 0700 -- -- -- -- -- 100 %  10/22/20 0655 -- -- -- -- -- 100 %  10/22/20 0650 -- -- -- -- -- 100 %  10/22/20 0645 -- -- -- -- -- 100 %  10/22/20 0640 -- -- -- -- -- 99 %  10/22/20 0637 (!) 95/46 98 F (36.7 C) -- 86 20 --  10/22/20 0636 -- -- -- -- --  100 %  10/22/20 0635 -- -- -- -- -- 100 %  10/22/20 0630 -- -- -- -- -- 99 %  10/22/20 0625 -- -- -- -- -- 99 %  10/22/20 0620 -- -- -- -- -- 98 %  10/22/20 0615 -- -- -- -- -- 97 %  10/22/20 0610 -- -- -- -- -- 97 %  10/22/20 0605 -- -- -- -- -- 100 %  10/22/20 0600 -- -- -- -- -- 99 %  10/22/20 0555 -- -- -- -- -- 100 %  10/22/20 0545 -- -- -- -- -- 100 %  10/22/20 0540 -- -- -- -- -- 100 %  10/22/20 0535 -- -- -- -- -- 100 %  10/22/20 0530 -- -- -- -- -- 100 %  10/22/20 0525 -- -- -- -- -- 100 %  10/22/20 0520 -- -- -- -- -- 100 %  10/22/20 0515 -- -- -- -- -- 100 %  10/22/20 0510 -- -- -- -- -- 100 %  10/22/20 0505 -- -- -- -- -- 100 %  10/22/20 0500 -- -- -- -- -- 100 %  10/22/20 0456 -- -- -- -- -- 100 %  10/22/20 0455 -- -- -- -- -- 100 %  10/22/20 0450 -- -- -- -- -- 100 %  10/22/20 0445 -- -- -- -- -- 100 %  10/22/20 0440 -- -- -- -- -- 100 %  10/22/20 0435 -- -- -- -- -- 100 %  10/22/20 0430 -- -- -- -- -- 100 %  10/22/20 0425 -- --  -- -- -- 100 %  10/22/20 0420 -- -- -- -- -- 100 %  10/22/20 0415 -- -- -- -- -- 100 %  10/22/20 0410 -- -- -- -- -- 100 %  10/22/20 0405 -- -- -- -- -- 100 %  10/22/20 0400 -- -- -- -- -- 100 %  10/22/20 0355 -- -- -- -- -- 100 %  10/22/20 0350 -- -- -- -- -- 100 %  10/22/20 0345 -- -- -- -- -- 100 %  10/22/20 0340 -- -- -- -- -- 100 %  10/22/20 0335 -- -- -- -- -- 100 %  10/22/20 0330 -- -- -- -- -- 100 %  10/22/20 0325 -- -- -- -- -- 100 %  10/22/20 0320 -- -- -- -- -- 100 %  10/22/20 0316 -- -- -- -- -- 100 %  10/22/20 0315 -- -- -- -- -- 100 %  10/22/20 0310 -- -- -- -- -- 100 %  10/22/20 0305 -- -- -- -- -- 100 %  10/22/20 0300 -- -- -- -- -- 100 %  10/22/20 0255 -- -- -- -- -- 100 %  10/22/20 0250 -- -- -- -- -- 100 %  10/22/20 0245 -- -- -- -- -- 99 %  10/22/20 0240 -- -- -- -- -- 99 %  10/22/20 0235 -- -- -- -- -- 99 %  10/22/20 0230 -- -- -- -- -- 99 %  10/22/20 0225 -- -- -- -- -- 99 %  10/22/20 0220 -- -- -- -- -- 99 %  10/22/20 0215 -- -- -- -- -- 99 %  10/22/20 0210 -- -- -- -- -- 99 %  10/22/20 0205 -- -- -- -- -- 99 %  10/22/20 0200 -- -- -- -- -- 99 %  10/22/20 0100 -- -- -- -- -- 99 %  10/22/20 0000 -- -- -- -- -- 98 %  10/21/20 2300 -- -- -- -- --   100 %  10/21/20 2200 -- -- -- -- -- 98 %  10/21/20 2132 (!) 111/58 98.4 F (36.9 C) Oral -- (!) 24 96 %  10/21/20 2000 -- -- -- -- -- 92 %  10/21/20 1900 -- -- -- -- -- 93 %  10/21/20 1544 106/61 98.4 F (36.9 C) Oral (!) 124 (!) 30 --  10/21/20 1218 99/70 97.6 F (36.4 C) Oral (!) 115 (!) 30 97 %   FHT: 155 baseline, +accels, no decel, mod variability Toco: quiet x 10m  Physical exam: General: Well nourished, well developed female in no acute distress. Abdomen: gravid nttp Respiratory: no resp distress Skin: Warm and dry.   When patient slouches, o2 92-94% on RA. With helping patient sit up more in the bed, she's 99-100% on RA  Medications: Current Facility-Administered Medications  Medication  Dose Route Frequency Provider Last Rate Last Admin  . 0.9 %  sodium chloride infusion   Intravenous PRN Constant, Peggy, MD      . acetaminophen (TYLENOL) tablet 650 mg  650 mg Oral Q4H PRN Constant, Peggy, MD   650 mg at 10/21/20 0820  . albuterol (VENTOLIN HFA) 108 (90 Base) MCG/ACT inhaler 2 puff  2 puff Inhalation Once PRN Constant, Peggy, MD      . albuterol (VENTOLIN HFA) 108 (90 Base) MCG/ACT inhaler 2 puff  2 puff Inhalation Q6H Agbata, Tochukwu, MD   2 puff at 10/22/20 0902  . ascorbic acid (VITAMIN C) tablet 500 mg  500 mg Oral Daily Agbata, Tochukwu, MD   500 mg at 10/22/20 0902  . calcium carbonate (TUMS - dosed in mg elemental calcium) chewable tablet 400 mg of elemental calcium  2 tablet Oral Q4H PRN Constant, Peggy, MD      . dexamethasone (DECADRON) injection 6 mg  6 mg Intravenous Q24H Agbata, Tochukwu, MD   6 mg at 10/21/20 1538  . dextrose 5 %-0.45 % sodium chloride infusion   Intravenous Continuous [place] Bing, MD   Stopped at 10/20/20 0020  . diphenhydrAMINE (BENADRYL) injection 50 mg  50 mg Intravenous Once PRN Constant, Peggy, MD      . docusate sodium (COLACE) capsule 100 mg  100 mg Oral Daily Constant, Peggy, MD   100 mg at 10/22/20 0902  . enoxaparin (LOVENOX) injection 40 mg  40 mg Subcutaneous Q24H Constant, Peggy, MD   40 mg at 10/21/20 2122  . EPINEPHrine (EPI-PEN) injection 0.3 mg  0.3 mg Intramuscular Once PRN Constant, Peggy, MD      . guaiFENesin-dextromethorphan (ROBITUSSIN DM) 100-10 MG/5ML syrup 10 mL  10 mL Oral Q4H PRN Agbata, Tochukwu, MD   10 mL at 10/21/20 1206  . lactated ringers infusion   Intravenous Continuous Constant, Peggy, MD 75 mL/hr at 10/22/20 0128 New Bag at 10/22/20 0128  . methylPREDNISolone sodium succinate (SOLU-MEDROL) 125 mg/2 mL injection 125 mg  125 mg Intravenous Once PRN Constant, Peggy, MD      . ondansetron (ZOFRAN) injection 4 mg  4 mg Intravenous Q6H PRN [place] Bing, MD   4 mg at 10/20/20 1041  . ondansetron (ZOFRAN-ODT)  disintegrating tablet 4 mg  4 mg Oral Q8H PRN [place] Bing, MD      . oxyCODONE-acetaminophen (PERCOCET/ROXICET) 5-325 MG per tablet 1-2 tablet  1-2 tablet Oral Q6H PRN Constant, Peggy, MD   2 tablet at 10/21/20 2122  . pantoprazole (PROTONIX) EC tablet 40 mg  40 mg Oral Daily Elgergawy, Leana Roe, MD   40 mg at 10/22/20 0902  .  potassium chloride SA (KLOR-CON) CR tablet 40 mEq  40 mEq Oral Once Agbata, Tochukwu, MD      . prenatal multivitamin tablet 1 tablet  1 tablet Oral Q1200 Constant, Peggy, MD   1 tablet at 10/21/20 1204  . remdesivir 100 mg in sodium chloride 0.9 % 100 mL IVPB  100 mg Intravenous Daily Agbata, Tochukwu, MD   Stopped at 10/21/20 1300  . Vitamin D (Ergocalciferol) (DRISDOL) capsule 50,000 Units  50,000 Units Oral Q7 days [place] Bing, MD      . zinc sulfate capsule 220 mg  220 mg Oral Daily Agbata, Tochukwu, MD   220 mg at 10/22/20 0902    Labs:  Recent Labs  Lab 10/19/20 1643 10/21/20 0523 10/22/20 0544  WBC 5.0 5.3 5.1  HGB 9.2* 7.1* 7.3*  HCT 26.6* 21.2* 21.6*  PLT 129* 106* 127*    Recent Labs  Lab 10/19/20 1643 10/21/20 0523 10/22/20 0544  NA 133* 137 139  K 3.3* 3.3* 4.0  CL 100 105 107  CO2 20* 23 27  BUN <5* <5* <5*  CREATININE 0.61 0.47 0.46  CALCIUM 8.5* 8.3* 8.4*  PROT 7.8 6.0* 5.9*  BILITOT 2.0* 1.5* 1.4*  ALKPHOS 96 70 80  ALT 15 14 16   AST 29 24 33  GLUCOSE 112* 113* 116*    Radiology:  No new imaging  Assessment & Plan:  Pt stable *Pregnancy: routine care. Reactive NST. qday NSTs *COVID PNA: co-managing with hospitalist service, appreciate recs.  -s/p monoclonal AB on 11/17 *[place] disease/Heme: improving CBCs; likely dilutional.  *Preterm: no issues. NICU aware *PPx: lovenox, scds *FEN/GI: regular diet, MIVF decreased to 100/hr.  *Dispo: once stable on RA. Possibly tomorrow.   12/17 MD Attending Center for [facility]Faculty [facility]) GYN Consult Phone: [phone] (M-F, 0800-1700) &  [phone] (Off hours, weekends, holidays)

## 2020-10-22 NOTE — Progress Notes (Signed)
PROGRESS NOTE                                                                             PROGRESS NOTE                                                                                                                                                                                                             Patient Demographics:    Nicole Case, is a 19 y.o. female, DOB - 09/26/2001, YTK:[id number]  Outpatient Primary MD for the patient is Patient, No Pcp Per    LOS - 3  Admit date - 10/19/2020    Chief Complaint  Patient presents with  . Cough  . Fever  . Nausea  . Emesis  . Hematemesis  . Migraine       Brief Narrative    Patient is a 19 year old African-American female who is 27 weeks and 4 days pregnant with a past medical history significant for anxiety disorder, sickle cell anemia and obesity who presents to the emergency room for evaluation of fever.  In the ER she had a T-max of 101.2, she was tachypneic and tachycardic. She is unvaccinated against the COVID-19 virus and in addition to a 1 week history of intermittent fever/chills, she also complains of myalgias, nausea, vomiting, diarrhea, cough productive of yellow phlegm and shortness of breath.  She has been on over-the-counter medications for her symptoms with no improvement.  She denies having any abdominal pain, no vaginal bleeding or discharge, no frequency of urination, no dysuria, no headaches, no chest pain Labs showed sodium of 133, potassium 3.3, chloride 100, bicarb 20, glucose 112, BUN less than 5, creatinine 0.61, calcium 8.5, alkaline phosphatase 96, albumin 3.4, lipase 17, AST 29, ALT 15, total protein 7.8, white count 5.0, hemoglobin 9.8, hematocrit 26.6, MCV 70.6, RDW 21.2, platelet count 129 COVID-19 PCR test is positive (10/19/20) Patient is currently admitted to the obstetrics/gynecology service    Subjective:    Nicole Case today reports he is improving,  appetite has improved  as well, but reports significant cough, which does cause her to have nausea and vomiting .   Assessment  & Plan :    Principal Problem:   Pneumonia due to COVID-19 virus Active Problems:   Anemia   Hypokalemia   Normal IUP (intrauterine pregnancy) on prenatal ultrasound, third trimester   Thrombocytopenia affecting pregnancy (HCC)   COVID-19 infection/pneumonia  -This x-ray significant for pneumonia multifocal, but more pronounced in the right lung, this is due to COVID-19 infection ,  -  procalcitonin within normal range, no evidence of bacterial infection, no indication for antibiotics. -Patient with some intermittent oxygen requirement, mainly overnight, during daytime with no oxygen requirement, I think most likely in the setting of undiagnosed OSA, as well with poor inspiratory effort as she lays on the bed most of the day, I have encouraged her to get out of bed to chair, and keep using incentive spirometer . -Continue with Decadron. -Continue with remdesivir. -She received monoclonal antibody - Encouraged the patient to sit up in chair in the daytime use I-S and flutter valve for pulmonary toiletry and then prone in bed when at night.  Will advance activity and titrate down oxygen as possible.     SpO2: 97 % O2 Flow Rate (L/min): 2 L/min  Recent Labs  Lab 10/19/20 1643 10/19/20 1644 10/20/20 0845 10/21/20 0523 10/22/20 0544  WBC 5.0  --   --  5.3 5.1  PLT 129*  --   --  106* 127*  CRP  --   --  5.0* 9.0* 8.0*  DDIMER  --   --  1.92* 1.61* 1.56*  PROCALCITON  --   --  <0.10  --   --   AST 29  --   --  24 33  ALT 15  --   --  14 16  ALKPHOS 96  --   --  70 80  BILITOT 2.0*  --   --  1.5* 1.4*  ALBUMIN 3.4*  --   --  2.4* 2.4*  SARSCOV2NAA  --  POSITIVE*  --   --   --       Hypokalemia -Repleted  Normal intrauterine pregnancy -Managed by primary OB service.  History of sickle cell disease [place] type/anemia of sickle cell -Patient reports  she used to follow-up at [facility], but did not follow-up for few years, reports she is not on any medications currently. -Between 7.3 today, stable over last 24 hours, initial drop most likely delusional as she was kept on IV fluids.  Vitamin D deficiency -started on supplements  Condition - Extremely Guarded  Family Communication  :  D/W patient  Code Status :  full  Procedures  :  none  PUD Prophylaxis : protonix  Disposition Plan  :    Status is: Inpatient  Remains inpatient appropriate because:IV treatments appropriate due to intensity of illness or inability to take PO   Dispo: The patient is from: Home              Anticipated d/c is to: Home              Anticipated d/c date is: 1 day              Patient currently is not medically stable to d/c.      DVT Prophylaxis  :  Lovenox   Lab Results  Component Value Date   PLT 127 (L) 10/22/2020    Diet :  Diet Order  Diet regular Room service appropriate? Yes; Fluid consistency: Thin  Diet effective now                  Inpatient Medications  Scheduled Meds: . albuterol  2 puff Inhalation Q6H  . vitamin C  500 mg Oral Daily  . dexamethasone (DECADRON) injection  6 mg Intravenous Q24H  . docusate sodium  100 mg Oral Daily  . enoxaparin (LOVENOX) injection  40 mg Subcutaneous Q24H  . guaiFENesin-dextromethorphan  10 mL Oral BID  . pantoprazole  40 mg Oral Daily  . potassium chloride  40 mEq Oral Once  . prenatal multivitamin  1 tablet Oral Q1200  . Vitamin D (Ergocalciferol)  50,000 Units Oral Q7 days  . zinc sulfate  220 mg Oral Daily   Continuous Infusions: . sodium chloride    . dextrose 5 % and 0.45% NaCl 100 mL/hr (10/22/20 1132)  . lactated ringers Stopped (10/22/20 1132)  . remdesivir 100 mg in NS 100 mL 100 mg (10/22/20 1140)   PRN Meds:.sodium chloride, acetaminophen, albuterol, calcium carbonate, diphenhydrAMINE, EPINEPHrine, methylPREDNISolone (SOLU-MEDROL) injection, ondansetron  (ZOFRAN) IV, ondansetron, oxyCODONE-acetaminophen  Antibiotics  :    Anti-infectives (From admission, onward)   Start     Dose/Rate Route Frequency Ordered Stop   10/21/20 1200  remdesivir 100 mg in sodium chloride 0.9 % 100 mL IVPB        100 mg 200 mL/hr over 30 Minutes Intravenous Daily 10/20/20 1506 10/25/20 1159   10/20/20 1600  remdesivir 200 mg in sodium chloride 0.9% 250 mL IVPB        200 mg 580 mL/hr over 30 Minutes Intravenous Once 10/20/20 1506 10/20/20 1701        Justyne Roell M.D on 10/22/2020 at 12:31 PM  To page go to [email or web address]   [facility] -  Office  [phone]    Objective:   Vitals:   10/22/20 0800 10/22/20 0805 10/22/20 0816 10/22/20 1129  BP:   115/80 (!) 108/59  Pulse:   (!) 108 (!) 105  Resp:   18 20  Temp:   97.8 F (36.6 C) 98.2 F (36.8 C)  TempSrc:   Oral Oral  SpO2: 100% 100% 100% 97%  Weight:      Height:        Wt Readings from Last 3 Encounters:  10/19/20 97.5 kg  09/21/20 123.8 kg  06/29/20 (!) 120.7 kg     Intake/Output Summary (Last 24 hours) at 10/22/2020 1231 Last data filed at 10/22/2020 1200 Gross per 24 hour  Intake 2768.42 ml  Output 2400 ml  Net 368.42 ml     Physical Exam  Awake Alert, Oriented X 3, No new F.N deficits, Normal affect Symmetrical Chest wall movement, Good air movement bilaterally, CTAB RRR,No Gallops,Rubs or new Murmurs, No Parasternal Heave +ve B.Sounds,  No Cyanosis, Clubbing or edema, No new Rash or bruise       Data Review:    CBC Recent Labs  Lab 10/19/20 1643 10/21/20 0523 10/22/20 0544  WBC 5.0 5.3 5.1  HGB 9.2* 7.1* 7.3*  HCT 26.6* 21.2* 21.6*  PLT 129* 106* 127*  MCV 70.6* 70.4* 69.5*  MCH 24.4* 23.6* 23.5*  MCHC 34.6 33.5 33.8  RDW 21.2* 21.1* 21.6*  LYMPHSABS 0.8 0.9 1.3  MONOABS 0.5 0.4 0.5  EOSABS 0.0 0.0 0.0  BASOSABS 0.0 0.0 0.0    Recent Labs  Lab 10/19/20 1643 10/20/20 0845 10/21/20 0523 10/22/20 0544  NA 133*  --  137 139  K 3.3*   --  3.3* 4.0  CL 100  --  105 107  CO2 20*  --  23 27  GLUCOSE 112*  --  113* 116*  BUN <5*  --  <5* <5*  CREATININE 0.61  --  0.47 0.46  CALCIUM 8.5*  --  8.3* 8.4*  AST 29  --  24 33  ALT 15  --  14 16  ALKPHOS 96  --  70 80  BILITOT 2.0*  --  1.5* 1.4*  ALBUMIN 3.4*  --  2.4* 2.4*  MG  --   --  1.7 1.9  CRP  --  5.0* 9.0* 8.0*  DDIMER  --  1.92* 1.61* 1.56*  PROCALCITON  --  <0.10  --   --     ------------------------------------------------------------------------------------------------------------------ No results for input(s): CHOL, HDL, LDLCALC, TRIG, CHOLHDL, LDLDIRECT in the last 72 hours.  Lab Results  Component Value Date   HGBA1C <4.2 (L) 06/29/2020   ------------------------------------------------------------------------------------------------------------------ No results for input(s): TSH, T4TOTAL, T3FREE, THYROIDAB in the last 72 hours.  Invalid input(s): FREET3  Cardiac Enzymes No results for input(s): CKMB, TROPONINI, MYOGLOBIN in the last 168 hours.  Invalid input(s): CK ------------------------------------------------------------------------------------------------------------------ No results found for: BNP  Micro Results Recent Results (from the past 240 hour(s))  Resp Panel by RT PCR (RSV, Flu A&B, Covid) - Nasopharyngeal Swab     Status: Abnormal   Collection Time: 10/19/20  4:44 PM   Specimen: Nasopharyngeal Swab  Result Value Ref Range Status   SARS Coronavirus 2 by RT PCR POSITIVE (A) NEGATIVE Final    Comment: RESULT CALLED TO, READ BACK BY AND VERIFIED WITH: JACOBS,D. RN  10/19/20 BILLINGSLEY,L (NOTE) SARS-CoV-2 target nucleic acids are DETECTED.  SARS-CoV-2 RNA is generally detectable in upper respiratory specimens  during the acute phase of infection. Positive results are indicative of the presence of the identified virus, but do not rule out bacterial infection or co-infection with other pathogens not detected by the test.  Clinical correlation with patient history and other diagnostic information is necessary to determine patient infection status. The expected result is Negative.  Fact Sheet for Patients:  https://www.moore.com/  Fact Sheet for Healthcare Providers: https://www.young.biz/  This test is not yet approved or cleared by the [place] FDA and  has been authorized for detection and/or diagnosis of SARS-CoV-2 by FDA under an Emergency Use Authorization (EUA).  This EUA will remain in effect (meaning this test  can be used) for the duration of  the COVID-19 declaration under Section 564(b)(1) of the Act, 21 U.S.C. section 360bbb-3(b)(1), unless the authorization is terminated or revoked sooner.      Influenza A by PCR NEGATIVE NEGATIVE Final   Influenza B by PCR NEGATIVE NEGATIVE Final    Comment: (NOTE) The Xpert Xpress SARS-CoV-2/FLU/RSV assay is intended as an aid in  the diagnosis of influenza from Nasopharyngeal swab specimens and  should not be used as a sole basis for treatment. Nasal washings and  aspirates are unacceptable for Xpert Xpress SARS-CoV-2/FLU/RSV  testing.  Fact Sheet for Patients: https://www.moore.com/  Fact Sheet for Healthcare Providers: https://www.young.biz/  This test is not yet approved or cleared by the [place] FDA and  has been authorized for detection and/or diagnosis of SARS-CoV-2 by  FDA under an Emergency Use Authorization (EUA). This EUA will remain  in effect (meaning this test can be used) for the duration of the  Covid-19 declaration under Section 564(b)(1) of the Act, 21  U.S.C. section 360bbb-3(b)(1), unless the authorization is  terminated or revoked.    Respiratory Syncytial Virus by PCR NEGATIVE NEGATIVE Final    Comment: (NOTE) Fact Sheet for Patients: https://www.moore.com/https://www.fda.gov/media/142436/download  Fact Sheet for Healthcare  Providers: https://www.young.biz/https://www.fda.gov/media/142435/download  This test is not yet approved or cleared by the [place] FDA and  has been authorized for detection and/or diagnosis of SARS-CoV-2 by  FDA under an Emergency Use Authorization (EUA). This EUA will remain  in effect (meaning this test can be used) for the duration of the  COVID-19 declaration under Section 564(b)(1) of the Act, 21 U.S.C.  section 360bbb-3(b)(1), unless the authorization is terminated or  revoked. Performed at [facility], [address] [place], [place] [address]     Radiology Reports DG Chest Port 1 View  Result Date: 10/19/2020 CLINICAL DATA:  Fever EXAM: PORTABLE CHEST 1 VIEW COMPARISON:  09/08/2020 FINDINGS: Airspace opacity noted in the right mid lung compatible with pneumonia. Probable patchy left mid to lower lung opacity. Low lung volumes. Heart is normal size. No effusions or pneumothorax. No acute bony abnormality. IMPRESSION: Patchy bilateral airspace opacities, greatest in the right mid lung concerning for pneumonia. Electronically Signed   By: Charlett NoseKevin  Dover M.D.   On: 10/19/2020 17:22

## 2020-10-23 DIAGNOSIS — R197 Diarrhea, unspecified: Secondary | ICD-10-CM

## 2020-10-23 DIAGNOSIS — U071 COVID-19: Secondary | ICD-10-CM | POA: Diagnosis not present

## 2020-10-23 DIAGNOSIS — J1282 Pneumonia due to coronavirus disease 2019: Secondary | ICD-10-CM | POA: Diagnosis not present

## 2020-10-23 DIAGNOSIS — R112 Nausea with vomiting, unspecified: Secondary | ICD-10-CM

## 2020-10-23 DIAGNOSIS — Z3A27 27 weeks gestation of pregnancy: Secondary | ICD-10-CM | POA: Diagnosis not present

## 2020-10-23 DIAGNOSIS — Z3493 Encounter for supervision of normal pregnancy, unspecified, third trimester: Secondary | ICD-10-CM | POA: Diagnosis not present

## 2020-10-23 DIAGNOSIS — D6489 Other specified anemias: Secondary | ICD-10-CM | POA: Diagnosis not present

## 2020-10-23 LAB — C-REACTIVE PROTEIN: CRP: 3.4 mg/dL — ABNORMAL HIGH (ref <1.0)

## 2020-10-23 LAB — CBC WITH DIFFERENTIAL/PLATELET
Abs Immature Granulocytes: 0 10*3/uL (ref 0.00–0.07)
Band Neutrophils: 1 %
Basophils Absolute: 0 10*3/uL (ref 0.0–0.1)
Basophils Relative: 0 %
Eosinophils Absolute: 0.1 10*3/uL (ref 0.0–0.5)
Eosinophils Relative: 1 %
HCT: 22.2 % — ABNORMAL LOW (ref 36.0–46.0)
Hemoglobin: 7.5 g/dL — ABNORMAL LOW (ref 12.0–15.0)
Lymphocytes Relative: 15 %
Lymphs Abs: 0.8 10*3/uL (ref 0.7–4.0)
MCH: 24 pg — ABNORMAL LOW (ref 26.0–34.0)
MCHC: 33.8 g/dL (ref 30.0–36.0)
MCV: 70.9 fL — ABNORMAL LOW (ref 80.0–100.0)
Monocytes Absolute: 0.1 10*3/uL (ref 0.1–1.0)
Monocytes Relative: 1 %
Neutro Abs: 4.5 10*3/uL (ref 1.7–7.7)
Neutrophils Relative %: 82 %
Platelets: 124 10*3/uL — ABNORMAL LOW (ref 150–400)
RBC: 3.13 MIL/uL — ABNORMAL LOW (ref 3.87–5.11)
RDW: 21.6 % — ABNORMAL HIGH (ref 11.5–15.5)
WBC: 5.4 10*3/uL (ref 4.0–10.5)
nRBC: 3 % — ABNORMAL HIGH (ref 0.0–0.2)
nRBC: 3 /100 WBC — ABNORMAL HIGH

## 2020-10-23 LAB — COMPREHENSIVE METABOLIC PANEL
ALT: 26 U/L (ref 0–44)
AST: 57 U/L — ABNORMAL HIGH (ref 15–41)
Albumin: 2.2 g/dL — ABNORMAL LOW (ref 3.5–5.0)
Alkaline Phosphatase: 82 U/L (ref 38–126)
Anion gap: 10 (ref 5–15)
BUN: <5 mg/dL — ABNORMAL LOW (ref 6–20)
CO2: 22 mmol/L (ref 22–32)
Calcium: 8.3 mg/dL — ABNORMAL LOW (ref 8.9–10.3)
Chloride: 106 mmol/L (ref 98–111)
Creatinine, Ser: 0.5 mg/dL (ref 0.44–1.00)
GFR, Estimated: >60 mL/min (ref >60)
Glucose, Bld: 108 mg/dL — ABNORMAL HIGH (ref 70–99)
Potassium: 4 mmol/L (ref 3.5–5.1)
Sodium: 138 mmol/L (ref 135–145)
Total Bilirubin: 1.5 mg/dL — ABNORMAL HIGH (ref 0.3–1.2)
Total Protein: 5.4 g/dL — ABNORMAL LOW (ref 6.5–8.1)

## 2020-10-23 LAB — PHOSPHORUS: Phosphorus: 3.5 mg/dL (ref 2.5–4.6)

## 2020-10-23 LAB — MAGNESIUM: Magnesium: 1.6 mg/dL — ABNORMAL LOW (ref 1.7–2.4)

## 2020-10-23 LAB — D-DIMER, QUANTITATIVE: D-Dimer, Quant: 1.78 ug/mL-FEU — ABNORMAL HIGH (ref 0.00–0.50)

## 2020-10-23 LAB — FERRITIN: Ferritin: 48 ng/mL (ref 11–307)

## 2020-10-23 MED ORDER — SODIUM CHLORIDE 0.9 % IV BOLUS
500.0000 mL | Freq: Once | INTRAVENOUS | Status: AC
  Administered 2020-10-23: 500 mL via INTRAVENOUS

## 2020-10-23 MED ORDER — PROMETHAZINE HCL 25 MG/ML IJ SOLN: 12.5000 mg | INTRAMUSCULAR | Status: DC | PRN

## 2020-10-23 NOTE — Progress Notes (Signed)
Patient SpO2 stating 93-94% on RA between 2105-2120 w/ RN at bedside monitoring continuous pulse Ox. Nasal Cannula added 1L/min @2120  per standing order to maintain O2 >95%. SpO2 stating 96-98. Will continue to monitor and titrate down as tolerated. IS encouraged, modified Sim and prone positioning discussed.

## 2020-10-23 NOTE — Progress Notes (Signed)
Patient ID: Nicole Case, female   DOB: 05-Sep-2001, 19 y.o.   MRN: [id number] FACULTY PRACTICE ANTEPARTUM(COMPREHENSIVE) NOTE  Nicole Case is a 19 y.o. G1P0 with Estimated Date of Delivery: 01/15/21   By   [redacted]w[redacted]d  who is admitted for COVID pneumonia with sickle cell disease.    Fetal presentation is unsure. Length of Stay:  4  Days  Date of admission:10/19/2020  Subjective: Feels better than the previous  Patient reports the fetal movement as active. Patient reports uterine contraction  activity as none. Patient reports  vaginal bleeding as none. Patient describes fluid per vagina as None.  Vitals:  Blood pressure 116/79, pulse 100, temperature 98.3 F (36.8 C), temperature source Oral, resp. rate 19, height 5\' 7"  (1.702 m), weight 97.5 kg, last menstrual period 04/10/2020, SpO2 94 %. Vitals:   10/23/20 0620 10/23/20 0625 10/23/20 0630 10/23/20 0750  BP:    116/79  Pulse:    100  Resp:    19  Temp:      TempSrc:      SpO2: 95% 94% 95% 94%  Weight:      Height:       Physical Examination:  General appearance - alert, well appearing, and in no distress Abdomen - soft, nontender, nondistended, no masses or organomegaly Fundal Height:  size equals dates Pelvic Exam:  normal external genitalia, vulva, vagina, cervix, uterus and adnexa, examination not indicated Cervical Exam: Not evaluated.Extremities: extremities normal, atraumatic, no cyanosis or edema with DTRs 2+ bilaterally Membranes:intact  Fetal Monitoring:  Baseline: 150 bpm, Variability: Good {> 6 bpm), Accelerations: Reactive and Decelerations: Absent   reactive  Labs:  Results for orders placed or performed during the hospital encounter of 10/19/20 (from the past 24 hour(s))  Comprehensive metabolic panel   Collection Time: 10/23/20  6:35 AM  Result Value Ref Range   Sodium 138 135 - 145 mmol/L   Potassium 4.0 3.5 - 5.1 mmol/L   Chloride 106 98 - 111 mmol/L   CO2 22 22 - 32 mmol/L   Glucose, Bld 108 (H) 70  - 99 mg/dL   BUN <5 (L) 6 - 20 mg/dL   Creatinine, Ser 10/25/20 0.44 - 1.00 mg/dL   Calcium 8.3 (L) 8.9 - 10.3 mg/dL   Total Protein 5.4 (L) 6.5 - 8.1 g/dL   Albumin 2.2 (L) 3.5 - 5.0 g/dL   AST 57 (H) 15 - 41 U/L   ALT 26 0 - 44 U/L   Alkaline Phosphatase 82 38 - 126 U/L   Total Bilirubin 1.5 (H) 0.3 - 1.2 mg/dL   GFR, Estimated 6.60 >63 mL/min   Anion gap 10 5 - 15  C-reactive protein   Collection Time: 10/23/20  6:35 AM  Result Value Ref Range   CRP 3.4 (H) <1.0 mg/dL  D-dimer, quantitative (not at [facility])   Collection Time: 10/23/20  6:35 AM  Result Value Ref Range   D-Dimer, Quant 1.78 (H) 0.00 - 0.50 ug/mL-FEU  Ferritin   Collection Time: 10/23/20  6:35 AM  Result Value Ref Range   Ferritin 48 11 - 307 ng/mL  Magnesium   Collection Time: 10/23/20  6:35 AM  Result Value Ref Range   Magnesium 1.6 (L) 1.7 - 2.4 mg/dL  Phosphorus   Collection Time: 10/23/20  6:35 AM  Result Value Ref Range   Phosphorus 3.5 2.5 - 4.6 mg/dL    Imaging Studies:    DG Chest Port 1 View  Result Date: 10/19/2020 CLINICAL DATA:  Fever EXAM: PORTABLE CHEST 1 VIEW COMPARISON:  09/08/2020 FINDINGS: Airspace opacity noted in the right mid lung compatible with pneumonia. Probable patchy left mid to lower lung opacity. Low lung volumes. Heart is normal size. No effusions or pneumothorax. No acute bony abnormality. IMPRESSION: Patchy bilateral airspace opacities, greatest in the right mid lung concerning for pneumonia. Electronically Signed   By: Charlett Nose M.D.   On: 10/19/2020 17:22     Medications:  Scheduled . albuterol  2 puff Inhalation Q6H  . vitamin C  500 mg Oral Daily  . dexamethasone (DECADRON) injection  6 mg Intravenous Q24H  . docusate sodium  100 mg Oral Daily  . enoxaparin (LOVENOX) injection  40 mg Subcutaneous Q24H  . guaiFENesin-dextromethorphan  10 mL Oral BID  . pantoprazole  40 mg Oral Daily  . prenatal multivitamin  1 tablet Oral Q1200  . Vitamin D (Ergocalciferol)  50,000  Units Oral Q7 days  . zinc sulfate  220 mg Oral Daily   I have reviewed the patient's current medications.  ASSESSMENT: G1P0 [redacted]w[redacted]d Estimated Date of Delivery: 01/15/21  Patient Active Problem List   Diagnosis Date Noted  . Thrombocytopenia affecting pregnancy (HCC) 10/21/2020  . Hypokalemia 10/20/2020  . Normal IUP (intrauterine pregnancy) on prenatal ultrasound, third trimester 10/20/2020  . Pneumonia due to COVID-19 virus 10/19/2020  . 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 Carrier of genetic disorder 07/10/2020  . 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 Trichomonal vaginitis during pregnancy in first trimester 07/03/2020  . Anemia 07/03/2020  . Maternal morbid obesity, antepartum (HCC) 06/29/2020  . Supervision of high risk pregnancy, antepartum 06/28/2020  . Central chest pain   . Sickle cell anemia with crisis (HCC) 06/20/2020  . Sore throat 10/06/2019  . Fever 10/06/2019  . Urinary tract infection symptoms 10/06/2019  . Sickle-cell disease with pain (HCC) 08/01/2017  . Sickle cell disease, type [place], with unspecified crisis (HCC) 07/31/2017  . Sickle cell anemia with pain (HCC) 05/28/2016  . 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 Sickle cell pain crisis (HCC) 01/29/2014  . Community acquired pneumonia 12/12/2011  . Sickle cell pain crisis 12/12/2011    PLAN: Continue methylprednisolone and remdesivir, finishes up tomorrow so will keep in house until then  [employer] 10/23/2020,9:09 AM

## 2020-10-23 NOTE — Progress Notes (Signed)
PROGRESS NOTE                                                                             PROGRESS NOTE                                                                                                                                                                                                             Patient Demographics:    Shaasia Odle, is a 19 y.o. female, DOB - 02/12/2001, FYB:[id number]  Outpatient Primary MD for the patient is Patient, No Pcp Per    LOS - 4  Admit date - 10/19/2020    Chief Complaint  Patient presents with  . Cough  . Fever  . Nausea  . Emesis  . Hematemesis  . Migraine       Brief Narrative    Patient is a 19 year old African-American female who is 27 weeks and 4 days pregnant with a past medical history significant for anxiety disorder, sickle cell anemia and obesity who presents to the emergency room for evaluation of fever.  In the ER she had a T-max of 101.2, she was tachypneic and tachycardic. She is unvaccinated against the COVID-19 virus and in addition to a 1 week history of intermittent fever/chills, she also complains of myalgias, nausea, vomiting, diarrhea, cough productive of yellow phlegm and shortness of breath.  She has been on over-the-counter medications for her symptoms with no improvement.  She denies having any abdominal pain, no vaginal bleeding or discharge, no frequency of urination, no dysuria, no headaches, no chest pain Labs showed sodium of 133, potassium 3.3, chloride 100, bicarb 20, glucose 112, BUN less than 5, creatinine 0.61, calcium 8.5, alkaline phosphatase 96, albumin 3.4, lipase 17, AST 29, ALT 15, total protein 7.8, white count 5.0, hemoglobin 9.8, hematocrit 26.6, MCV 70.6, RDW 21.2, platelet count 129 COVID-19 PCR test is positive (10/19/20) Patient is currently admitted to the obstetrics/gynecology service    Subjective:    Manasvini Lightle today reports appetite has  improved, cough has improved  as well, denies any further vomiting .   Assessment  & Plan :    Principal Problem:   Pneumonia due to COVID-19 virus Active Problems:   Anemia   Hypokalemia   Normal IUP (intrauterine pregnancy) on prenatal ultrasound, third trimester   Thrombocytopenia affecting pregnancy (HCC)   COVID-19 infection/pneumonia  -This x-ray significant for pneumonia multifocal, but more pronounced in the right lung, this is due to COVID-19 infection ,  -  procalcitonin within normal range, no evidence of bacterial infection, no indication for antibiotics. -Patient with some intermittent oxygen requirement, mainly overnight, during daytime with no oxygen requirement, I think most likely in the setting of undiagnosed OSA, as well with poor inspiratory effort as she lays on the bed most of the day, I have encouraged her to get out of bed to chair, and keep using incentive spirometer . -Continue with Decadron. -Continue with remdesivir, she is unable to go tomorrow to [facility] for outpatient remdesivir, so she will stay till tomorrow to finish her fifth dose of remdesivir. -She received monoclonal antibody - Encouraged the patient to sit up in chair in the daytime use I-S and flutter valve for pulmonary toiletry and then prone in bed when at night.  Will advance activity and titrate down oxygen as possible.     SpO2: 96 % O2 Flow Rate (L/min): 2 L/min  Recent Labs  Lab 10/19/20 1643 10/19/20 1644 10/20/20 0845 10/21/20 0523 10/22/20 0544 10/23/20 0635 10/23/20 0826  WBC 5.0  --   --  5.3 5.1  --  5.4  PLT 129*  --   --  106* 127*  --  124*  CRP  --   --  5.0* 9.0* 8.0* 3.4*  --   DDIMER  --   --  1.92* 1.61* 1.56* 1.78*  --   PROCALCITON  --   --  <0.10  --   --   --   --   AST 29  --   --  24 33 57*  --   ALT 15  --   --  14 16 26   --   ALKPHOS 96  --   --  70 80 82  --   BILITOT 2.0*  --   --  1.5* 1.4* 1.5*  --   ALBUMIN 3.4*  --   --  2.4* 2.4* 2.2*  --    SARSCOV2NAA  --  POSITIVE*  --   --   --   --   --       Hypokalemia -Repleted  Normal intrauterine pregnancy -Managed by primary OB service.  History of sickle cell disease [place] type/anemia of sickle cell -Patient reports she used to follow-up at [facility], but did not follow-up for few years, reports she is not on any medications currently. -Between 7.3 today, stable over last 24 hours, initial drop most likely delusional as she was kept on IV fluids.  Vitamin D deficiency -started on supplements  Condition - Extremely Guarded  Family Communication  :  D/W patient  Code Status :  full  Procedures  :  none  PUD Prophylaxis : protonix  Disposition Plan  :    Status is: Inpatient  Remains inpatient appropriate because:IV treatments appropriate due to intensity of illness or inability to take PO   Dispo: The patient is from: Home              Anticipated d/c is to: Home  Anticipated d/c date is: 1 day              Patient currently is not medically stable to d/c.      DVT Prophylaxis  :  Lovenox   Lab Results  Component Value Date   PLT 124 (L) 10/23/2020    Diet :  Diet Order            Diet regular Room service appropriate? Yes; Fluid consistency: Thin  Diet effective now                  Inpatient Medications  Scheduled Meds: . albuterol  2 puff Inhalation Q6H  . vitamin C  500 mg Oral Daily  . dexamethasone (DECADRON) injection  6 mg Intravenous Q24H  . docusate sodium  100 mg Oral Daily  . enoxaparin (LOVENOX) injection  40 mg Subcutaneous Q24H  . guaiFENesin-dextromethorphan  10 mL Oral BID  . pantoprazole  40 mg Oral Daily  . prenatal multivitamin  1 tablet Oral Q1200  . Vitamin D (Ergocalciferol)  50,000 Units Oral Q7 days  . zinc sulfate  220 mg Oral Daily   Continuous Infusions: . dextrose 5 % and 0.45% NaCl 100 mL/hr at 10/23/20 0753  . remdesivir 100 mg in NS 100 mL Stopped (10/22/20 1210)   PRN Meds:.acetaminophen,  calcium carbonate, ondansetron (ZOFRAN) IV, ondansetron, oxyCODONE-acetaminophen  Antibiotics  :    Anti-infectives (From admission, onward)   Start     Dose/Rate Route Frequency Ordered Stop   10/21/20 1200  remdesivir 100 mg in sodium chloride 0.9 % 100 mL IVPB        100 mg 200 mL/hr over 30 Minutes Intravenous Daily 10/20/20 1506 10/25/20 1159   10/20/20 1600  remdesivir 200 mg in sodium chloride 0.9% 250 mL IVPB        200 mg 580 mL/hr over 30 Minutes Intravenous Once 10/20/20 1506 10/20/20 1701        Mychael Smock M.D on 10/23/2020 at 11:42 AM  To page go to [email or web address]   [facility] -  Office  [phone]    Objective:   Vitals:   10/23/20 0625 10/23/20 0630 10/23/20 0750 10/23/20 0916  BP:   116/79   Pulse:   100   Resp:   19   Temp:      TempSrc:      SpO2: 94% 95% 94% 96%  Weight:      Height:        Wt Readings from Last 3 Encounters:  10/19/20 97.5 kg  09/21/20 123.8 kg  06/29/20 (!) 120.7 kg     Intake/Output Summary (Last 24 hours) at 10/23/2020 1142 Last data filed at 10/23/2020 1044 Gross per 24 hour  Intake 2840.07 ml  Output 1750 ml  Net 1090.07 ml     Physical Exam  Awake Alert, Oriented X 3, No new F.N deficits, Normal affect Symmetrical Chest wall movement, Good air movement bilaterally, CTAB RRR,No Gallops,Rubs or new Murmurs, No Parasternal Heave No Cyanosis, Clubbing or edema, No new Rash or bruise    Patient RN Jeanice Lim was present as a female chaperone at bedside     Data Review:    CBC Recent Labs  Lab 10/19/20 1643 10/21/20 0523 10/22/20 0544 10/23/20 0826  WBC 5.0 5.3 5.1 5.4  HGB 9.2* 7.1* 7.3* 7.5*  HCT 26.6* 21.2* 21.6* 22.2*  PLT 129* 106* 127* 124*  MCV 70.6* 70.4* 69.5* 70.9*  MCH 24.4* 23.6* 23.5* 24.0*  MCHC  34.6 33.5 33.8 33.8  RDW 21.2* 21.1* 21.6* 21.6*  LYMPHSABS 0.8 0.9 1.3 0.8  MONOABS 0.5 0.4 0.5 0.1  EOSABS 0.0 0.0 0.0 0.1  BASOSABS 0.0 0.0 0.0 0.0    Recent Labs  Lab  10/19/20 1643 10/20/20 0845 10/21/20 0523 10/22/20 0544 10/23/20 0635  NA 133*  --  137 139 138  K 3.3*  --  3.3* 4.0 4.0  CL 100  --  105 107 106  CO2 20*  --  GLUCOSE 112*  --  113* 116* 108*  BUN <5*  --  <5* <5* <5*  CREATININE 0.61  --  0.47 0.46 0.50  CALCIUM 8.5*  --  8.3* 8.4* 8.3*  AST 29  --  24 33 57*  ALT 15  --  ALKPHOS 96  --  70 80 82  BILITOT 2.0*  --  1.5* 1.4* 1.5*  ALBUMIN 3.4*  --  2.4* 2.4* 2.2*  MG  --   --  1.7 1.9 1.6*  CRP  --  5.0* 9.0* 8.0* 3.4*  DDIMER  --  1.92* 1.61* 1.56* 1.78*  PROCALCITON  --  <0.10  --   --   --     ------------------------------------------------------------------------------------------------------------------ No results for input(s): CHOL, HDL, LDLCALC, TRIG, CHOLHDL, LDLDIRECT in the last 72 hours.  Lab Results  Component Value Date   HGBA1C <4.2 (L) 06/29/2020   ------------------------------------------------------------------------------------------------------------------ No results for input(s): TSH, T4TOTAL, T3FREE, THYROIDAB in the last 72 hours.  Invalid input(s): FREET3  Cardiac Enzymes No results for input(s): CKMB, TROPONINI, MYOGLOBIN in the last 168 hours.  Invalid input(s): CK ------------------------------------------------------------------------------------------------------------------ No results found for: BNP  Micro Results Recent Results (from the past 240 hour(s))  Resp Panel by RT PCR (RSV, Flu A&B, Covid) - Nasopharyngeal Swab     Status: Abnormal   Collection Time: 10/19/20  4:44 PM   Specimen: Nasopharyngeal Swab  Result Value Ref Range Status   SARS Coronavirus 2 by RT PCR POSITIVE (A) NEGATIVE Final    Comment: RESULT CALLED TO, READ BACK BY AND VERIFIED WITH: JACOBS,D. RN  10/19/20 BILLINGSLEY,L (NOTE) SARS-CoV-2 target nucleic acids are DETECTED.  SARS-CoV-2 RNA is generally detectable in upper respiratory specimens  during the acute phase of  infection. Positive results are indicative of the presence of the identified virus, but do not rule out bacterial infection or co-infection with other pathogens not detected by the test. Clinical correlation with patient history and other diagnostic information is necessary to determine patient infection status. The expected result is Negative.  Fact Sheet for Patients:  https://www.moore.com/  Fact Sheet for Healthcare Providers: https://www.young.biz/  This test is not yet approved or cleared by the [place] FDA and  has been authorized for detection and/or diagnosis of SARS-CoV-2 by FDA under an Emergency Use Authorization (EUA).  This EUA will remain in effect (meaning this test  can be used) for the duration of  the COVID-19 declaration under Section 564(b)(1) of the Act, 21 U.S.C. section 360bbb-3(b)(1), unless the authorization is terminated or revoked sooner.      Influenza A by PCR NEGATIVE NEGATIVE Final   Influenza B by PCR NEGATIVE NEGATIVE Final    Comment: (NOTE) The Xpert Xpress SARS-CoV-2/FLU/RSV assay is intended as an aid in  the diagnosis of influenza from Nasopharyngeal swab specimens and  should not be used as a sole basis for treatment. Nasal washings and  aspirates are unacceptable for Xpert Xpress SARS-CoV-2/FLU/RSV  testing.  Fact Sheet for Patients: https://www.moore.com/https://www.fda.gov/media/142436/download  Fact Sheet for Healthcare Providers: https://www.young.biz/https://www.fda.gov/media/142435/download  This test is not yet approved or cleared by the [place] FDA and  has been authorized for detection and/or diagnosis of SARS-CoV-2 by  FDA under an Emergency Use Authorization (EUA). This EUA will remain  in effect (meaning this test can be used) for the duration of the  Covid-19 declaration under Section 564(b)(1) of the Act, 21  U.S.C. section 360bbb-3(b)(1), unless the authorization is  terminated or revoked.    Respiratory  Syncytial Virus by PCR NEGATIVE NEGATIVE Final    Comment: (NOTE) Fact Sheet for Patients: https://www.moore.com/https://www.fda.gov/media/142436/download  Fact Sheet for Healthcare Providers: https://www.young.biz/https://www.fda.gov/media/142435/download  This test is not yet approved or cleared by the [place] FDA and  has been authorized for detection and/or diagnosis of SARS-CoV-2 by  FDA under an Emergency Use Authorization (EUA). This EUA will remain  in effect (meaning this test can be used) for the duration of the  COVID-19 declaration under Section 564(b)(1) of the Act, 21 U.S.C.  section 360bbb-3(b)(1), unless the authorization is terminated or  revoked. Performed at [facility], [address] [place], [place] [address]     Radiology Reports DG Chest Port 1 View  Result Date: 10/19/2020 CLINICAL DATA:  Fever EXAM: PORTABLE CHEST 1 VIEW COMPARISON:  09/08/2020 FINDINGS: Airspace opacity noted in the right mid lung compatible with pneumonia. Probable patchy left mid to lower lung opacity. Low lung volumes. Heart is normal size. No effusions or pneumothorax. No acute bony abnormality. IMPRESSION: Patchy bilateral airspace opacities, greatest in the right mid lung concerning for pneumonia. Electronically Signed   By: Charlett NoseKevin  Dover M.D.   On: 10/19/2020 17:22

## 2020-10-24 DIAGNOSIS — O98512 Other viral diseases complicating pregnancy, second trimester: Secondary | ICD-10-CM | POA: Diagnosis not present

## 2020-10-24 DIAGNOSIS — R197 Diarrhea, unspecified: Secondary | ICD-10-CM | POA: Diagnosis not present

## 2020-10-24 DIAGNOSIS — Z3493 Encounter for supervision of normal pregnancy, unspecified, third trimester: Secondary | ICD-10-CM | POA: Diagnosis not present

## 2020-10-24 DIAGNOSIS — U071 COVID-19: Secondary | ICD-10-CM | POA: Diagnosis not present

## 2020-10-24 DIAGNOSIS — Z3A27 27 weeks gestation of pregnancy: Secondary | ICD-10-CM | POA: Diagnosis not present

## 2020-10-24 DIAGNOSIS — J1282 Pneumonia due to coronavirus disease 2019: Secondary | ICD-10-CM | POA: Diagnosis not present

## 2020-10-24 DIAGNOSIS — R112 Nausea with vomiting, unspecified: Secondary | ICD-10-CM | POA: Diagnosis not present

## 2020-10-24 DIAGNOSIS — D6489 Other specified anemias: Secondary | ICD-10-CM

## 2020-10-24 LAB — TYPE AND SCREEN
ABO/RH(D): A POS
Antibody Screen: NEGATIVE

## 2020-10-24 LAB — COMPREHENSIVE METABOLIC PANEL
ALT: 39 U/L (ref 0–44)
AST: 63 U/L — ABNORMAL HIGH (ref 15–41)
Albumin: 2.1 g/dL — ABNORMAL LOW (ref 3.5–5.0)
Alkaline Phosphatase: 83 U/L (ref 38–126)
Anion gap: 8 (ref 5–15)
BUN: <5 mg/dL — ABNORMAL LOW (ref 6–20)
CO2: 24 mmol/L (ref 22–32)
Calcium: 8.2 mg/dL — ABNORMAL LOW (ref 8.9–10.3)
Chloride: 106 mmol/L (ref 98–111)
Creatinine, Ser: 0.53 mg/dL (ref 0.44–1.00)
GFR, Estimated: >60 mL/min (ref >60)
Glucose, Bld: 112 mg/dL — ABNORMAL HIGH (ref 70–99)
Potassium: 4.3 mmol/L (ref 3.5–5.1)
Sodium: 138 mmol/L (ref 135–145)
Total Bilirubin: 1.6 mg/dL — ABNORMAL HIGH (ref 0.3–1.2)
Total Protein: 5.2 g/dL — ABNORMAL LOW (ref 6.5–8.1)

## 2020-10-24 MED ORDER — VITAMIN D (ERGOCALCIFEROL) 1.25 MG (50000 UNIT) PO CAPS
50000.0000 [IU] | ORAL_CAPSULE | ORAL | 0 refills | Status: DC
Start: 2020-10-29 — End: 2021-01-03

## 2020-10-24 MED ORDER — DEXAMETHASONE 6 MG PO TABS: 6.0000 mg | ORAL_TABLET | Freq: Every day | ORAL | 0 refills | Status: DC

## 2020-10-24 MED ORDER — PANTOPRAZOLE SODIUM 40 MG PO TBEC
40.0000 mg | DELAYED_RELEASE_TABLET | Freq: Every day | ORAL | 0 refills | Status: DC
Start: 2020-10-25 — End: 2021-01-03

## 2020-10-24 MED ORDER — PROMETHAZINE HCL 25 MG/ML IJ SOLN: 12.5000 mg | INTRAMUSCULAR | Status: DC | PRN

## 2020-10-24 NOTE — Discharge Instructions (Signed)
Person Under Monitoring Name: Nicole Case  Location: [address] [place] [address]   Infection Prevention Recommendations for Individuals Confirmed to have, or Being Evaluated for, 2019 Novel Coronavirus (COVID-19) Infection Who Receive Care at Home  Individuals who are confirmed to have, or are being evaluated for, COVID-19 should follow the prevention steps below until a healthcare provider or local or state health department says they can return to normal activities.  Stay home except to get medical care You should restrict activities outside your home, except for getting medical care. Do not go to work, school, or public areas, and do not use public transportation or taxis.  Call ahead before visiting your doctor Before your medical appointment, call the healthcare provider and tell them that you have, or are being evaluated for, COVID-19 infection. This will help the healthcare provider's office take steps to keep other people from getting infected. Ask your healthcare provider to call the local or state health department.  Monitor your symptoms Seek prompt medical attention if your illness is worsening (e.g., difficulty breathing). Before going to your medical appointment, call the healthcare provider and tell them that you have, or are being evaluated for, COVID-19 infection. Ask your healthcare provider to call the local or state health department.  Wear a facemask You should wear a facemask that covers your nose and mouth when you are in the same room with other people and when you visit a healthcare provider. People who live with or visit you should also wear a facemask while they are in the same room with you.  Separate yourself from other people in your home As much as possible, you should stay in a different room from other people in your home. Also, you should use a separate bathroom, if available.  Avoid sharing household items You should not  share dishes, drinking glasses, cups, eating utensils, towels, bedding, or other items with other people in your home. After using these items, you should wash them thoroughly with soap and water.  Cover your coughs and sneezes Cover your mouth and nose with a tissue when you cough or sneeze, or you can cough or sneeze into your sleeve. Throw used tissues in a lined trash can, and immediately wash your hands with soap and water for at least 20 seconds or use an alcohol-based hand rub.  Wash your [employer] your hands often and thoroughly with soap and water for at least 20 seconds. You can use an alcohol-based hand sanitizer if soap and water are not available and if your hands are not visibly dirty. Avoid touching your eyes, nose, and mouth with unwashed hands.   Prevention Steps for Caregivers and Household Members of Individuals Confirmed to have, or Being Evaluated for, COVID-19 Infection Being Cared for in the Home  If you live with, or provide care at home for, a person confirmed to have, or being evaluated for, COVID-19 infection please follow these guidelines to prevent infection:  Follow healthcare provider's instructions Make sure that you understand and can help the patient follow any healthcare provider instructions for all care.  Provide for the patient's basic needs You should help the patient with basic needs in the home and provide support for getting groceries, prescriptions, and other personal needs.  Monitor the patient's symptoms If they are getting sicker, call his or her medical provider and tell them that the patient has, or is being evaluated for, COVID-19 infection. This will help the healthcare provider's office  take steps to keep other people from getting infected. Ask the healthcare provider to call the local or state health department.  Limit the number of people who have contact with the patient  If possible, have only one caregiver for the  patient.  Other household members should stay in another home or place of residence. If this is not possible, they should stay  in another room, or be separated from the patient as much as possible. Use a separate bathroom, if available.  Restrict visitors who do not have an essential need to be in the home.  Keep older adults, very young children, and other sick people away from the patient Keep older adults, very young children, and those who have compromised immune systems or chronic health conditions away from the patient. This includes people with chronic heart, lung, or kidney conditions, diabetes, and cancer.  Ensure good ventilation Make sure that shared spaces in the home have good air flow, such as from an air conditioner or an opened window, weather permitting.  Wash your hands often  Wash your hands often and thoroughly with soap and water for at least 20 seconds. You can use an alcohol based hand sanitizer if soap and water are not available and if your hands are not visibly dirty.  Avoid touching your eyes, nose, and mouth with unwashed hands.  Use disposable paper towels to dry your hands. If not available, use dedicated cloth towels and replace them when they become wet.  Wear a facemask and gloves  Wear a disposable facemask at all times in the room and gloves when you touch or have contact with the patient's blood, body fluids, and/or secretions or excretions, such as sweat, saliva, sputum, nasal mucus, vomit, urine, or feces.  Ensure the mask fits over your nose and mouth tightly, and do not touch it during use.  Throw out disposable facemasks and gloves after using them. Do not reuse.  Wash your hands immediately after removing your facemask and gloves.  If your personal clothing becomes contaminated, carefully remove clothing and launder. Wash your hands after handling contaminated clothing.  Place all used disposable facemasks, gloves, and other waste in a lined  container before disposing them with other household waste.  Remove gloves and wash your hands immediately after handling these items.  Do not share dishes, glasses, or other household items with the patient  Avoid sharing household items. You should not share dishes, drinking glasses, cups, eating utensils, towels, bedding, or other items with a patient who is confirmed to have, or being evaluated for, COVID-19 infection.  After the person uses these items, you should wash them thoroughly with soap and water.  Wash laundry thoroughly  Immediately remove and wash clothes or bedding that have blood, body fluids, and/or secretions or excretions, such as sweat, saliva, sputum, nasal mucus, vomit, urine, or feces, on them.  Wear gloves when handling laundry from the patient.  Read and follow directions on labels of laundry or clothing items and detergent. In general, wash and dry with the warmest temperatures recommended on the label.  Clean all areas the individual has used often  Clean all touchable surfaces, such as counters, tabletops, doorknobs, bathroom fixtures, toilets, phones, keyboards, tablets, and bedside tables, every day. Also, clean any surfaces that may have blood, body fluids, and/or secretions or excretions on them.  Wear gloves when cleaning surfaces the patient has come in contact with.  Use a diluted bleach solution (e.g., dilute bleach with 1 part  bleach and 10 parts water) or a household disinfectant with a label that says EPA-registered for coronaviruses. To make a bleach solution at home, add 1 tablespoon of bleach to 1 quart (4 cups) of water. For a larger supply, add  cup of bleach to 1 gallon (16 cups) of water.  Read labels of cleaning products and follow recommendations provided on product labels. Labels contain instructions for safe and effective use of the cleaning product including precautions you should take when applying the product, such as wearing gloves or  eye protection and making sure you have good ventilation during use of the product.  Remove gloves and wash hands immediately after cleaning.  Monitor yourself for signs and symptoms of illness Caregivers and household members are considered close contacts, should monitor their health, and will be asked to limit movement outside of the home to the extent possible. Follow the monitoring steps for close contacts listed on the symptom monitoring form.   ? If you have additional questions, contact your local health department or call the epidemiologist on call at [phone] (available 24/7). ? This guidance is subject to change. For the most up-to-date guidance from [facility], please refer to their website: [email or web address]

## 2020-10-24 NOTE — Progress Notes (Signed)
Patient ID: Nicole Case, female   DOB: June 09, 2001, 19 y.o.   MRN: [id number] FACULTY PRACTICE ANTEPARTUM(COMPREHENSIVE) NOTE  NAKEITHA MILLIGAN is a 19 y.o. G1P0 with Estimated Date of Delivery: 01/15/21   By   [redacted]w[redacted]d  who is admitted for COVID 19 in sickle cell anemia.    Fetal presentation is . Length of Stay:  5  Days  Date of admission:10/19/2020  Subjective: On supplemental O2 with sleep only due to previously undiagnosed OSA, presumptive Patient reports the fetal movement as active. Patient reports uterine contraction  activity as none. Patient reports  vaginal bleeding as none. Patient describes fluid per vagina as None.  Vitals:  Blood pressure (!) 113/56, pulse 84, temperature 98.5 F (36.9 C), temperature source Oral, resp. rate 20, height 5\' 7"  (1.702 m), weight 97.5 kg, last menstrual period 04/10/2020, SpO2 97 %. Vitals:   10/24/20 0219 10/24/20 0330 10/24/20 0535 10/24/20 0800  BP: 110/63 (!) 113/56    Pulse: (!) 102 84    Resp:  20    Temp:  98.5 F (36.9 C)    TempSrc:  Oral    SpO2:  97% 98% 97%  Weight:      Height:       Physical Examination:  General appearance - alert, well appearing, and in no distress Abdomen - soft, nontender, nondistended, no masses or organomegaly Fundal Height:  size equals dates Pelvic Exam:  examination not indicated Cervical Exam: Not evaluated. Extremities: extremities normal, atraumatic, no cyanosis or edema with DTRs 2+ bilaterally Membranes:intact  Fetal Monitoring:  Baseline: 150s bpm, Variability: Good {> 6 bpm), Accelerations: Reactive and Decelerations: Absent   reactive  Labs:  Results for orders placed or performed during the hospital encounter of 10/19/20 (from the past 24 hour(s))  Comprehensive metabolic panel   Collection Time: 10/24/20  6:17 AM  Result Value Ref Range   Sodium 138 135 - 145 mmol/L   Potassium 4.3 3.5 - 5.1 mmol/L   Chloride 106 98 - 111 mmol/L   CO2 24 22 - 32 mmol/L   Glucose, Bld 112 (H)  70 - 99 mg/dL   BUN <5 (L) 6 - 20 mg/dL   Creatinine, Ser 10/26/20 0.44 - 1.00 mg/dL   Calcium 8.2 (L) 8.9 - 10.3 mg/dL   Total Protein 5.2 (L) 6.5 - 8.1 g/dL   Albumin 2.1 (L) 3.5 - 5.0 g/dL   AST 63 (H) 15 - 41 U/L   ALT 39 0 - 44 U/L   Alkaline Phosphatase 83 38 - 126 U/L   Total Bilirubin 1.6 (H) 0.3 - 1.2 mg/dL   GFR, Estimated 6.01 >09 mL/min   Anion gap 8 5 - 15  Type and screen [facility]   Collection Time: 10/24/20  6:17 AM  Result Value Ref Range   ABO/RH(D) A POS    Antibody Screen NEG    Sample Expiration      10/27/2020,2359 Performed at [facility], [address], [place], [place]     Imaging Studies:    No results found.   Medications:  Scheduled . albuterol  2 puff Inhalation Q6H  . vitamin C  500 mg Oral Daily  . dexamethasone (DECADRON) injection  6 mg Intravenous Q24H  . docusate sodium  100 mg Oral Daily  . guaiFENesin-dextromethorphan  10 mL Oral BID  . pantoprazole  40 mg Oral Daily  . prenatal multivitamin  1 tablet Oral Q1200  . Vitamin D (Ergocalciferol)  50,000 Units Oral Q7 days  . zinc sulfate  220 mg Oral Daily   I have reviewed the patient's current medications.  ASSESSMENT: G1P0 [redacted]w[redacted]d Estimated Date of Delivery: 01/15/21  Patient Active Problem List   Diagnosis Date Noted  . Thrombocytopenia affecting pregnancy (HCC) 10/21/2020  . Hypokalemia 10/20/2020  . Normal IUP (intrauterine pregnancy) on prenatal ultrasound, third trimester 10/20/2020  . Pneumonia due to COVID-19 virus 10/19/2020  . 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 Carrier of genetic disorder 07/10/2020  . 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 Trichomonal vaginitis during pregnancy in first trimester 07/03/2020  . Anemia 07/03/2020  . Maternal morbid obesity, antepartum (HCC) 06/29/2020  . Supervision of high risk pregnancy, antepartum 06/28/2020  . Central chest pain   . Sickle cell anemia with crisis  (HCC) 06/20/2020  . Sore throat 10/06/2019  . Fever 10/06/2019  . Urinary tract infection symptoms 10/06/2019  . Sickle-cell disease with pain (HCC) 08/01/2017  . Sickle cell disease, type [place], with unspecified crisis (HCC) 07/31/2017  . Sickle cell anemia with pain (HCC) 05/28/2016  . 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 Sickle cell pain crisis (HCC) 01/29/2014  . Community acquired pneumonia 12/12/2011  . Sickle cell pain crisis 12/12/2011    PLAN: Await hospitalist evaluation for discharge likely after remdesivir infusion today  Dr Alysia Penna to reassess for diachrge  Jamiyla Ishee H Jaedan Huttner 10/24/2020,8:40 AM

## 2020-10-24 NOTE — Progress Notes (Signed)
Instructed pt. On use of Flutter valve device for airway clearance..  Pt demonstrated proper use.  Pt. encouraged to use device on her own after discharge.

## 2020-10-24 NOTE — Discharge Summary (Signed)
Patient ID: Nicole Case MRN: [id number] DOB/AGE: 02-08-2001 19 y.o.  Admit date: 10/19/2020 Discharge date: 10/24/2020  Admission Diagnoses: IUP 28 weeks, Covid pneumonia  Discharge Diagnoses: SAA, undeliveried  Prenatal Procedures: NST  Consults: [place] Hospital Course:  This is a 19 y.o. G1P0 with IUP at 65w1dadmitted for above mentioned Dx. Hospital service was consulted for management of her Covid pneumonia. She received, steroids, monoclonal antibodies and Remdesavir.  She did required supplemental O2 at night and this was felt to be related to undiagnosed OSA.   She was observed, fetal heart rate monitoring remained reassuring.   After completion of her treatment, she was deemed stable for discharge home from the hospital service. Discharge medications, instructions and follow up were reviewed with pt.     Discharge Exam: Temp:  [98 F (36.7 C)-98.7 F (37.1 C)] 98.4 F (36.9 C) (11/21 1531) Pulse Rate:  [83-102] 98 (11/21 1531) Resp:  [17-22] 17 (11/21 1531) BP: (90-122)/(48-73) 116/59 (11/21 1531) SpO2:  [94 %-100 %] 99 % (11/21 1530) Physical Examination: CONSTITUTIONAL: Well-developed, well-nourished female in no acute distress.  HENT:  Normocephalic, atraumatic, External right and left ear normal. Oropharynx is clear and moist EYES: Conjunctivae and EOM are normal. Pupils are equal, round, and reactive to light. No scleral icterus.  NECK: Normal range of motion, supple, no masses SKIN: Skin is warm and dry. No rash noted. Not diaphoretic. No erythema. No pallor. [place] Alert and oriented to person, place, and time. Normal reflexes, muscle tone coordination. No cranial nerve deficit noted. PSYCHIATRIC: Normal mood and affect. Normal behavior. Normal judgment and thought content. CARDIOVASCULAR: Normal heart rate noted, regular rhythm RESPIRATORY: Effort and breath sounds normal, no problems with respiration noted MUSCULOSKELETAL: Normal  range of motion. No edema and no tenderness. 2+ distal pulses. ABDOMEN: Soft, nontender, nondistended, gravid. CERVIX:  deffererd  Fetal monitoring: FHR: 1150's bpm, Variability: moderate, Accelerations: Present, Decelerations: Absent  Uterine activity: no contractions per hour  Significant Diagnostic Studies:  Results for orders placed or performed during the hospital encounter of 10/19/20 (from the past 168 hour(s))  Comprehensive metabolic panel   Collection Time: 10/19/20  4:43 PM  Result Value Ref Range   Sodium 133 (L) 135 - 145 mmol/L   Potassium 3.3 (L) 3.5 - 5.1 mmol/L   Chloride 100 98 - 111 mmol/L   CO2 20 (L) 22 - 32 mmol/L   Glucose, Bld 112 (H) 70 - 99 mg/dL   BUN <5 (L) 6 - 20 mg/dL   Creatinine, Ser 0.61 0.44 - 1.00 mg/dL   Calcium 8.5 (L) 8.9 - 10.3 mg/dL   Total Protein 7.8 6.5 - 8.1 g/dL   Albumin 3.4 (L) 3.5 - 5.0 g/dL   AST 29 15 - 41 U/L   ALT 15 0 - 44 U/L   Alkaline Phosphatase 96 38 - 126 U/L   Total Bilirubin 2.0 (H) 0.3 - 1.2 mg/dL   GFR, Estimated >60 >60 mL/min   Anion gap 13 5 - 15  Ethanol   Collection Time: 10/19/20  4:43 PM  Result Value Ref Range   Alcohol, Ethyl (B) <10 <10 mg/dL  Lipase, blood   Collection Time: 10/19/20  4:43 PM  Result Value Ref Range   Lipase 17 11 - 51 U/L  CBC with Differential   Collection Time: 10/19/20  4:43 PM  Result Value Ref Range   WBC 5.0 4.0 - 10.5 K/uL   RBC 3.77 (L) 3.87 - 5.11 MIL/uL  Hemoglobin 9.2 (L) 12.0 - 15.0 g/dL   HCT 26.6 (L) 36 - 46 %   MCV 70.6 (L) 80.0 - 100.0 fL   MCH 24.4 (L) 26.0 - 34.0 pg   MCHC 34.6 30.0 - 36.0 g/dL   RDW 21.2 (H) 11.5 - 15.5 %   Platelets 129 (L) 150 - 400 K/uL   nRBC 0.8 (H) 0.0 - 0.2 %   Neutrophils Relative % 74 %   Neutro Abs 3.7 1.7 - 7.7 K/uL   Lymphocytes Relative 15 %   Lymphs Abs 0.8 0.7 - 4.0 K/uL   Monocytes Relative 9 %   Monocytes Absolute 0.5 0.1 - 1.0 K/uL   Eosinophils Relative 0 %   Eosinophils Absolute 0.0 0.0 - 0.5 K/uL   Basophils  Relative 0 %   Basophils Absolute 0.0 0.0 - 0.1 K/uL   Immature Granulocytes 2 %   Abs Immature Granulocytes 0.08 (H) 0.00 - 0.07 K/uL   Polychromasia PRESENT    Target Cells PRESENT   Resp Panel by RT PCR (RSV, Flu A&B, Covid) - Nasopharyngeal Swab   Collection Time: 10/19/20  4:44 PM   Specimen: Nasopharyngeal Swab  Result Value Ref Range   SARS Coronavirus 2 by RT PCR POSITIVE (A) NEGATIVE   Influenza A by PCR NEGATIVE NEGATIVE   Influenza B by PCR NEGATIVE NEGATIVE   Respiratory Syncytial Virus by PCR NEGATIVE NEGATIVE  Type and screen [place]   Collection Time: 10/20/20  3:50 AM  Result Value Ref Range   ABO/RH(D) A POS    Antibody Screen NEG    Sample Expiration      10/23/2020,2359 Performed at [facility], [place] [address] [place], [place] [address]   Procalcitonin - Baseline   Collection Time: 10/20/20  8:45 AM  Result Value Ref Range   Procalcitonin <0.10 ng/mL  Ferritin   Collection Time: 10/20/20  8:45 AM  Result Value Ref Range   Ferritin 31 11 - 307 ng/mL  Fibrinogen   Collection Time: 10/20/20  8:45 AM  Result Value Ref Range   Fibrinogen 417 210 - 475 mg/dL  Lactate dehydrogenase   Collection Time: 10/20/20  8:45 AM  Result Value Ref Range   LDH 203 (H) 98 - 192 U/L  D-dimer, quantitative (not at [facility])   Collection Time: 10/20/20  8:45 AM  Result Value Ref Range   D-Dimer, Quant 1.92 (H) 0.00 - 0.50 ug/mL-FEU  C-reactive protein   Collection Time: 10/20/20  8:45 AM  Result Value Ref Range   CRP 5.0 (H) <1.0 mg/dL  Type and screen [place]   Collection Time: 10/20/20  8:45 AM  Result Value Ref Range   ABO/RH(D) A POS    Antibody Screen NEG    Sample Expiration      10/23/2020,2359 Performed at [facility], [place] [address], [place], [place] [address]   CBC with Differential/Platelet   Collection Time: 10/21/20  5:23 AM  Result Value Ref Range   WBC 5.3 4.0 - 10.5 K/uL   RBC  3.01 (L) 3.87 - 5.11 MIL/uL   Hemoglobin 7.1 (L) 12.0 - 15.0 g/dL   HCT 21.2 (L) 36 - 46 %   MCV 70.4 (L) 80.0 - 100.0 fL   MCH 23.6 (L) 26.0 - 34.0 pg   MCHC 33.5 30.0 - 36.0 g/dL   RDW 21.1 (H) 11.5 - 15.5 %   Platelets 106 (L) 150 - 400 K/uL   nRBC  0.6 (H) 0.0 - 0.2 %   Neutrophils Relative % 74 %   Neutro Abs 3.9 1.7 - 7.7 K/uL   Lymphocytes Relative 16 %   Lymphs Abs 0.9 0.7 - 4.0 K/uL   Monocytes Relative 7 %   Monocytes Absolute 0.4 0.1 - 1.0 K/uL   Eosinophils Relative 0 %   Eosinophils Absolute 0.0 0.0 - 0.5 K/uL   Basophils Relative 0 %   Basophils Absolute 0.0 0.0 - 0.1 K/uL   Immature Granulocytes 3 %   Abs Immature Granulocytes 0.13 (H) 0.00 - 0.07 K/uL  Comprehensive metabolic panel   Collection Time: 10/21/20  5:23 AM  Result Value Ref Range   Sodium 137 135 - 145 mmol/L   Potassium 3.3 (L) 3.5 - 5.1 mmol/L   Chloride 105 98 - 111 mmol/L   CO2 23 22 - 32 mmol/L   Glucose, Bld 113 (H) 70 - 99 mg/dL   BUN <5 (L) 6 - 20 mg/dL   Creatinine, Ser 0.47 0.44 - 1.00 mg/dL   Calcium 8.3 (L) 8.9 - 10.3 mg/dL   Total Protein 6.0 (L) 6.5 - 8.1 g/dL   Albumin 2.4 (L) 3.5 - 5.0 g/dL   AST 24 15 - 41 U/L   ALT 14 0 - 44 U/L   Alkaline Phosphatase 70 38 - 126 U/L   Total Bilirubin 1.5 (H) 0.3 - 1.2 mg/dL   GFR, Estimated >60 >60 mL/min   Anion gap 9 5 - 15  C-reactive protein   Collection Time: 10/21/20  5:23 AM  Result Value Ref Range   CRP 9.0 (H) <1.0 mg/dL  D-dimer, quantitative (not at [facility])   Collection Time: 10/21/20  5:23 AM  Result Value Ref Range   D-Dimer, Quant 1.61 (H) 0.00 - 0.50 ug/mL-FEU  Ferritin   Collection Time: 10/21/20  5:23 AM  Result Value Ref Range   Ferritin 46 11 - 307 ng/mL  Magnesium   Collection Time: 10/21/20  5:23 AM  Result Value Ref Range   Magnesium 1.7 1.7 - 2.4 mg/dL  Phosphorus   Collection Time: 10/21/20  5:23 AM  Result Value Ref Range   Phosphorus 3.5 2.5 - 4.6 mg/dL  VITAMIN D 25 Hydroxy (Vit-D Deficiency, Fractures)    Collection Time: 10/21/20  5:23 AM  Result Value Ref Range   Vit D, 25-Hydroxy 17.45 (L) 30 - 100 ng/mL  CBC with Differential/Platelet   Collection Time: 10/22/20  5:44 AM  Result Value Ref Range   WBC 5.1 4.0 - 10.5 K/uL   RBC 3.11 (L) 3.87 - 5.11 MIL/uL   Hemoglobin 7.3 (L) 12.0 - 15.0 g/dL   HCT 21.6 (L) 36 - 46 %   MCV 69.5 (L) 80.0 - 100.0 fL   MCH 23.5 (L) 26.0 - 34.0 pg   MCHC 33.8 30.0 - 36.0 g/dL   RDW 21.6 (H) 11.5 - 15.5 %   Platelets 127 (L) 150 - 400 K/uL   nRBC 1.4 (H) 0.0 - 0.2 %   Neutrophils Relative % 59 %   Neutro Abs 3.0 1.7 - 7.7 K/uL   Lymphocytes Relative 25 %   Lymphs Abs 1.3 0.7 - 4.0 K/uL   Monocytes Relative 10 %   Monocytes Absolute 0.5 0.1 - 1.0 K/uL   Eosinophils Relative 0 %   Eosinophils Absolute 0.0 0.0 - 0.5 K/uL   Basophils Relative 0 %   Basophils Absolute 0.0 0.0 - 0.1 K/uL   WBC Morphology MILD LEFT SHIFT (1-5% METAS, OCC  MYELO, OCC BANDS)    Immature Granulocytes 6 %   Abs Immature Granulocytes 0.30 (H) 0.00 - 0.07 K/uL   Schistocytes PRESENT    Tear Drop Cells PRESENT    Polychromasia PRESENT    Target Cells PRESENT   Comprehensive metabolic panel   Collection Time: 10/22/20  5:44 AM  Result Value Ref Range   Sodium 139 135 - 145 mmol/L   Potassium 4.0 3.5 - 5.1 mmol/L   Chloride 107 98 - 111 mmol/L   CO2 27 22 - 32 mmol/L   Glucose, Bld 116 (H) 70 - 99 mg/dL   BUN <5 (L) 6 - 20 mg/dL   Creatinine, Ser 0.46 0.44 - 1.00 mg/dL   Calcium 8.4 (L) 8.9 - 10.3 mg/dL   Total Protein 5.9 (L) 6.5 - 8.1 g/dL   Albumin 2.4 (L) 3.5 - 5.0 g/dL   AST 33 15 - 41 U/L   ALT 16 0 - 44 U/L   Alkaline Phosphatase 80 38 - 126 U/L   Total Bilirubin 1.4 (H) 0.3 - 1.2 mg/dL   GFR, Estimated >60 >60 mL/min   Anion gap 5 5 - 15  C-reactive protein   Collection Time: 10/22/20  5:44 AM  Result Value Ref Range   CRP 8.0 (H) <1.0 mg/dL  D-dimer, quantitative (not at [facility])   Collection Time: 10/22/20  5:44 AM  Result Value Ref Range   D-Dimer,  Quant 1.56 (H) 0.00 - 0.50 ug/mL-FEU  Ferritin   Collection Time: 10/22/20  5:44 AM  Result Value Ref Range   Ferritin 67 11 - 307 ng/mL  Magnesium   Collection Time: 10/22/20  5:44 AM  Result Value Ref Range   Magnesium 1.9 1.7 - 2.4 mg/dL  Phosphorus   Collection Time: 10/22/20  5:44 AM  Result Value Ref Range   Phosphorus 4.1 2.5 - 4.6 mg/dL  Comprehensive metabolic panel   Collection Time: 10/23/20  6:35 AM  Result Value Ref Range   Sodium 138 135 - 145 mmol/L   Potassium 4.0 3.5 - 5.1 mmol/L   Chloride 106 98 - 111 mmol/L   CO2 22 22 - 32 mmol/L   Glucose, Bld 108 (H) 70 - 99 mg/dL   BUN <5 (L) 6 - 20 mg/dL   Creatinine, Ser 0.50 0.44 - 1.00 mg/dL   Calcium 8.3 (L) 8.9 - 10.3 mg/dL   Total Protein 5.4 (L) 6.5 - 8.1 g/dL   Albumin 2.2 (L) 3.5 - 5.0 g/dL   AST 57 (H) 15 - 41 U/L   ALT 26 0 - 44 U/L   Alkaline Phosphatase 82 38 - 126 U/L   Total Bilirubin 1.5 (H) 0.3 - 1.2 mg/dL   GFR, Estimated >60 >60 mL/min   Anion gap 10 5 - 15  C-reactive protein   Collection Time: 10/23/20  6:35 AM  Result Value Ref Range   CRP 3.4 (H) <1.0 mg/dL  D-dimer, quantitative (not at [facility])   Collection Time: 10/23/20  6:35 AM  Result Value Ref Range   D-Dimer, Quant 1.78 (H) 0.00 - 0.50 ug/mL-FEU  Ferritin   Collection Time: 10/23/20  6:35 AM  Result Value Ref Range   Ferritin 48 11 - 307 ng/mL  Magnesium   Collection Time: 10/23/20  6:35 AM  Result Value Ref Range   Magnesium 1.6 (L) 1.7 - 2.4 mg/dL  Phosphorus   Collection Time: 10/23/20  6:35 AM  Result Value Ref Range   Phosphorus 3.5 2.5 - 4.6 mg/dL  CBC with Differential/Platelet   Collection Time: 10/23/20  8:26 AM  Result Value Ref Range   WBC 5.4 4.0 - 10.5 K/uL   RBC 3.13 (L) 3.87 - 5.11 MIL/uL   Hemoglobin 7.5 (L) 12.0 - 15.0 g/dL   HCT 22.2 (L) 36 - 46 %   MCV 70.9 (L) 80.0 - 100.0 fL   MCH 24.0 (L) 26.0 - 34.0 pg   MCHC 33.8 30.0 - 36.0 g/dL   RDW 21.6 (H) 11.5 - 15.5 %   Platelets 124 (L) 150 - 400 K/uL    nRBC 3.0 (H) 0.0 - 0.2 %   Neutrophils Relative % 82 %   Neutro Abs 4.5 1.7 - 7.7 K/uL   Band Neutrophils 1 %   Lymphocytes Relative 15 %   Lymphs Abs 0.8 0.7 - 4.0 K/uL   Monocytes Relative 1 %   Monocytes Absolute 0.1 0.1 - 1.0 K/uL   Eosinophils Relative 1 %   Eosinophils Absolute 0.1 0.0 - 0.5 K/uL   Basophils Relative 0 %   Basophils Absolute 0.0 0.0 - 0.1 K/uL   nRBC 3 (H) 0 /100 WBC   Abs Immature Granulocytes 0.00 0.00 - 0.07 K/uL   Tear Drop Cells PRESENT    Polychromasia PRESENT    Target Cells PRESENT    Ovalocytes PRESENT   Comprehensive metabolic panel   Collection Time: 10/24/20  6:17 AM  Result Value Ref Range   Sodium 138 135 - 145 mmol/L   Potassium 4.3 3.5 - 5.1 mmol/L   Chloride 106 98 - 111 mmol/L   CO2 24 22 - 32 mmol/L   Glucose, Bld 112 (H) 70 - 99 mg/dL   BUN <5 (L) 6 - 20 mg/dL   Creatinine, Ser 0.53 0.44 - 1.00 mg/dL   Calcium 8.2 (L) 8.9 - 10.3 mg/dL   Total Protein 5.2 (L) 6.5 - 8.1 g/dL   Albumin 2.1 (L) 3.5 - 5.0 g/dL   AST 63 (H) 15 - 41 U/L   ALT 39 0 - 44 U/L   Alkaline Phosphatase 83 38 - 126 U/L   Total Bilirubin 1.6 (H) 0.3 - 1.2 mg/dL   GFR, Estimated >60 >60 mL/min   Anion gap 8 5 - 15  Type and screen [place]   Collection Time: 10/24/20  6:17 AM  Result Value Ref Range   ABO/RH(D) A POS    Antibody Screen NEG    Sample Expiration      10/27/2020,2359 Performed at [facility], [address], [place], [place] [address]     Discharge Condition: Stable  Disposition: Discharge disposition: 01-Home or Self Care        Discharge Instructions    Discharge activity:  No Restrictions   Complete by: As directed    Discharge diet:  No restrictions   Complete by: As directed    Do not have sex or do anything that might make you have an orgasm   Complete by: As directed    Fetal Kick Count:  Lie on our left side for one hour after a meal, and count the number of times your baby kicks.  If it is less  than 5 times, get up, move around and drink some juice.  Repeat the test 30 minutes later.  If it is still less than 5 kicks in an hour, notify your doctor.   Complete by: As directed    Notify physician for a general feeling that "something is not  right"   Complete by: As directed    Notify physician for increase or change in vaginal discharge   Complete by: As directed    Notify physician for intestinal cramps, with or without diarrhea, sometimes described as "gas pain"   Complete by: As directed    Notify physician for leaking of fluid   Complete by: As directed    Notify physician for low, dull backache, unrelieved by heat or Tylenol   Complete by: As directed    Notify physician for menstrual like cramps   Complete by: As directed    Notify physician for pelvic pressure   Complete by: As directed    Notify physician for uterine contractions.  These may be painless and feel like the uterus is tightening or the baby is  "balling up"   Complete by: As directed    Notify physician for vaginal bleeding   Complete by: As directed    PRETERM LABOR:  Includes any of the follwing symptoms that occur between 20 - [redacted] weeks gestation.  If these symptoms are not stopped, preterm labor can result in preterm delivery, placing your baby at risk   Complete by: As directed      Allergies as of 10/24/2020   No Known Allergies     Medication List    STOP taking these medications   acetaminophen 325 MG tablet Commonly known as: TYLENOL   amoxicillin-clavulanate 875-125 MG tablet Commonly known as: AUGMENTIN   ferrous sulfate 325 (65 FE) MG tablet   ondansetron 4 MG tablet Commonly known as: Zofran     TAKE these medications   aspirin 81 MG chewable tablet Chew 1 tablet (81 mg total) by mouth daily.   Blood Pressure Kit Devi 1 kit by Does not apply route once a week. Check Blood Pressure regularly and record readings into the [employer] App.  Large Cuff.  DX O90.0   dexamethasone 6  MG tablet Commonly known as: DECADRON Take 1 tablet (6 mg total) by mouth daily. Start taking on: October 25, 2020   Doxylamine-Pyridoxine 10-10 MG Tbec Commonly known as: Diclegis Take 2 tablets by mouth at bedtime. What changed:   when to take this  reasons to take this   oxyCODONE 5 MG immediate release tablet Commonly known as: Roxicodone Take 1 tablet (5 mg total) by mouth every 4 (four) hours as needed for severe pain.   pantoprazole 40 MG tablet Commonly known as: PROTONIX Take 1 tablet (40 mg total) by mouth daily. Start taking on: October 25, 2020   PRENATAL VITAMIN PO Take 2 tablets by mouth daily.   Vitamin D (Ergocalciferol) 1.25 MG (50000 UNIT) Caps capsule Commonly known as: DRISDOL Take 1 capsule (50,000 Units total) by mouth every 7 (seven) days. Start taking on: October 29, 2020       Follow-up Information    [place] Follow up.   Why: Pt already has OB follow up appt 11/02/20 Contact information: [address] [place] [address] [phone]              Signed: Chancy Milroy M.D. 10/24/2020, 4:29 PM

## 2020-10-24 NOTE — Progress Notes (Addendum)
PROGRESS NOTE                                                                             PROGRESS NOTE                                                                                                                                                                                                             Patient Demographics:    Nicole Case, is a 19 y.o. female, DOB - September 27, 2001, ZOX:[id number]  Outpatient Primary MD for the patient is Patient, No Pcp Per    LOS - 5  Admit date - 10/19/2020    Chief Complaint  Patient presents with   Cough   Fever   Nausea   Emesis   Hematemesis   Migraine       Brief Narrative    Patient is a 19 year old African-American female who is 27 weeks and 4 days pregnant with a past medical history significant for anxiety disorder, sickle cell anemia and obesity who presents to the emergency room for evaluation of fever.  In the ER she had a T-max of 101.2, she was tachypneic and tachycardic. She is unvaccinated against the COVID-19 virus and in addition to a 1 week history of intermittent fever/chills, she also complains of myalgias, nausea, vomiting, diarrhea, cough productive of yellow phlegm and shortness of breath.  She has been on over-the-counter medications for her symptoms with no improvement.  She denies having any abdominal pain, no vaginal bleeding or discharge, no frequency of urination, no dysuria, no headaches, no chest pain Labs showed sodium of 133, potassium 3.3, chloride 100, bicarb 20, glucose 112, BUN less than 5, creatinine 0.61, calcium 8.5, alkaline phosphatase 96, albumin 3.4, lipase 17, AST 29, ALT 15, total protein 7.8, white count 5.0, hemoglobin 9.8, hematocrit 26.6, MCV 70.6, RDW 21.2, platelet count 129 COVID-19 PCR test is positive (10/19/20) Patient is currently admitted to the obstetrics/gynecology service    Subjective:    Rhya Barwick today good appetite, no nausea,  no vomiting, cough  has improved, no further dyspnea .   Assessment  & Plan :    Principal Problem:   Pneumonia due to COVID-19 virus Active Problems:   Anemia   Hypokalemia   Normal IUP (intrauterine pregnancy) on prenatal ultrasound, third trimester   Thrombocytopenia affecting pregnancy (HCC)   COVID-19 infection/pneumonia  -This x-ray significant for pneumonia multifocal, but more pronounced in the right lung, this is due to COVID-19 infection ,  -  procalcitonin within normal range, no evidence of bacterial infection, no indication for antibiotics. -Patient with some intermittent oxygen requirement, mainly overnight, during daytime with no oxygen requirement, I think most likely in the setting of undiagnosed OSA, as well with poor inspiratory effort as she lays on the bed most of the day, I have encouraged her to get out of bed to chair, and keep using incentive spirometer .  I have reminded her to take her incentive spirometer and flutter at home and keep using it. -Treated with IV remdesivir during hospital stay x5 days, she was on Decadron as well, to finish another 5 days as an outpatient.Marland Kitchen     SpO2: 95 % O2 Flow Rate (L/min): 0.5 L/min  Recent Labs  Lab 10/19/20 1643 10/19/20 1644 10/20/20 0845 10/21/20 0523 10/22/20 0544 10/23/20 0635 10/23/20 0826 10/24/20 0617  WBC 5.0  --   --  5.3 5.1  --  5.4  --   PLT 129*  --   --  106* 127*  --  124*  --   CRP  --   --  5.0* 9.0* 8.0* 3.4*  --   --   DDIMER  --   --  1.92* 1.61* 1.56* 1.78*  --   --   PROCALCITON  --   --  <0.10  --   --   --   --   --   AST 29  --   --  24 33 57*  --  63*  ALT 15  --   --  14 16 26   --  39  ALKPHOS 96  --   --  70 80 82  --  83  BILITOT 2.0*  --   --  1.5* 1.4* 1.5*  --  1.6*  ALBUMIN 3.4*  --   --  2.4* 2.4* 2.2*  --  2.1*  SARSCOV2NAA  --  POSITIVE*  --   --   --   --   --   --       Hypokalemia -Repleted  Normal intrauterine pregnancy -Managed by primary OB service.  History  of sickle cell disease [place] type/anemia of sickle cell -Patient reports she used to follow[facility]up at [facility], but did not follow-up for few years, reports she is not on any medications currently. -Initial drop in hemoglobin due to dilution, but has been stable since.  Vitamin D deficiency -started on supplements  Condition - Extremely Guarded  Family Communication  :  D/W patient  Code Status :  full  Procedures  :  none  PUD Prophylaxis : protonix  Disposition Plan  :    Status is: Inpatient  Remains inpatient appropriate because:for discharge today.   Dispo: The patient is from: Home              Anticipated d/c is to: Home              Anticipated d/c date is: Today              Patient currently  is medically stable to d/c.      DVT Prophylaxis  :  Lovenox   Lab Results  Component Value Date   PLT 124 (L) 10/23/2020    Diet :  Diet Order            Diet regular Room service appropriate? Yes; Fluid consistency: Thin  Diet effective now                  Inpatient Medications  Scheduled Meds:  albuterol  2 puff Inhalation Q6H   vitamin C  500 mg Oral Daily   dexamethasone (DECADRON) injection  6 mg Intravenous Q24H   docusate sodium  100 mg Oral Daily   guaiFENesin-dextromethorphan  10 mL Oral BID   pantoprazole  40 mg Oral Daily   prenatal multivitamin  1 tablet Oral Q1200   Vitamin D (Ergocalciferol)  50,000 Units Oral Q7 days   zinc sulfate  220 mg Oral Daily   Continuous Infusions:  dextrose 5 % and 0.45% NaCl 100 mL/hr at 10/24/20 [id number]   remdesivir 100 mg in NS 100 mL Stopped (10/23/20 1245)   PRN Meds:.acetaminophen, calcium carbonate, ondansetron (ZOFRAN) IV, ondansetron, oxyCODONE-acetaminophen, promethazine  Antibiotics  :    Anti-infectives (From admission, onward)   Start     Dose/Rate Route Frequency Ordered Stop   10/21/20 1200  remdesivir 100 mg in sodium chloride 0.9 % 100 mL IVPB        100 mg 200 mL/hr over 30 Minutes  Intravenous Daily 10/20/20 1506 10/25/20 1159   10/20/20 1600  remdesivir 200 mg in sodium chloride 0.9% 250 mL IVPB        200 mg 580 mL/hr over 30 Minutes Intravenous Once 10/20/20 1506 10/20/20 1701        Kendyll Huettner M.D on 10/24/2020 at 9:55 AM  To page go to [email or web address]   [facility] -  Office  [phone]    Objective:   Vitals:   10/24/20 0535 10/24/20 0800 10/24/20 0850 10/24/20 0900  BP:   103/73   Pulse:   (!) 101   Resp:   (!) 22   Temp:   98.5 F (36.9 C)   TempSrc:   Oral   SpO2: 98% 97% 95% 95%  Weight:      Height:        Wt Readings from Last 3 Encounters:  10/19/20 97.5 kg  09/21/20 123.8 kg  06/29/20 (!) 120.7 kg     Intake/Output Summary (Last 24 hours) at 10/24/2020 0955 Last data filed at 10/24/2020 0900 Gross per 24 hour  Intake 3260 ml  Output 2550 ml  Net 710 ml     Physical Exam  Awake Alert, Oriented X 3, No new F.N deficits, Normal affect Symmetrical Chest wall movement, Good air movement bilaterally, CTAB RRR,No Gallops,Rubs or new Murmurs, No Parasternal Heave. No Cyanosis, Clubbing or edema, No new Rash or bruise        Data Review:    CBC Recent Labs  Lab 10/19/20 1643 10/21/20 0523 10/22/20 0544 10/23/20 0826  WBC 5.0 5.3 5.1 5.4  HGB 9.2* 7.1* 7.3* 7.5*  HCT 26.6* 21.2* 21.6* 22.2*  PLT 129* 106* 127* 124*  MCV 70.6* 70.4* 69.5* 70.9*  MCH 24.4* 23.6* 23.5* 24.0*  MCHC 34.6 33.5 33.8 33.8  RDW 21.2* 21.1* 21.6* 21.6*  LYMPHSABS 0.8 0.9 1.3 0.8  MONOABS 0.5 0.4 0.5 0.1  EOSABS 0.0 0.0 0.0 0.1  BASOSABS 0.0 0.0 0.0 0.0  Recent Labs  Lab 10/19/20 1643 10/20/20 0845 10/21/20 0523 10/22/20 0544 10/23/20 0635 10/24/20 0617  NA 133*  --  137 139 138 138  K 3.3*  --  3.3* 4.0 4.0 4.3  CL 100  --  105 107 106 106  CO2 20*  --  23 27 22 24   GLUCOSE 112*  --  113* 116* 108* 112*  BUN <5*  --  <5* <5* <5* <5*  CREATININE 0.61  --  0.47 0.46 0.50 0.53  CALCIUM 8.5*  --  8.3* 8.4* 8.3*  8.2*  AST 29  --  24 33 57* 63*  ALT 15  --  14 16 26  39  ALKPHOS 96  --  70 80 82 83  BILITOT 2.0*  --  1.5* 1.4* 1.5* 1.6*  ALBUMIN 3.4*  --  2.4* 2.4* 2.2* 2.1*  MG  --   --  1.7 1.9 1.6*  --   CRP  --  5.0* 9.0* 8.0* 3.4*  --   DDIMER  --  1.92* 1.61* 1.56* 1.78*  --   PROCALCITON  --  <0.10  --   --   --   --     ------------------------------------------------------------------------------------------------------------------ No results for input(s): CHOL, HDL, LDLCALC, TRIG, CHOLHDL, LDLDIRECT in the last 72 hours.  Lab Results  Component Value Date   HGBA1C <4.2 (L) 06/29/2020   ------------------------------------------------------------------------------------------------------------------ No results for input(s): TSH, T4TOTAL, T3FREE, THYROIDAB in the last 72 hours.  Invalid input(s): FREET3  Cardiac Enzymes No results for input(s): CKMB, TROPONINI, MYOGLOBIN in the last 168 hours.  Invalid input(s): CK ------------------------------------------------------------------------------------------------------------------ No results found for: BNP  Micro Results Recent Results (from the past 240 hour(s))  Resp Panel by RT PCR (RSV, Flu A&B, Covid) - Nasopharyngeal Swab     Status: Abnormal   Collection Time: 10/19/20  4:44 PM   Specimen: Nasopharyngeal Swab  Result Value Ref Range Status   SARS Coronavirus 2 by RT PCR POSITIVE (A) NEGATIVE Final    Comment: RESULT CALLED TO, READ BACK BY AND VERIFIED WITH: JACOBS,D. RN @1846  10/19/20 BILLINGSLEY,L (NOTE) SARS-CoV-2 target nucleic acids are DETECTED.  SARS-CoV-2 RNA is generally detectable in upper respiratory specimens  during the acute phase of infection. Positive results are indicative of the presence of the identified virus, but do not rule out bacterial infection or co-infection with other pathogens not detected by the test. Clinical correlation with patient history and other diagnostic information is necessary  to determine patient infection status. The expected result is Negative.  Fact Sheet for Patients:  10/21/20  Fact Sheet for Healthcare Providers:  This test is not yet approved or cleared by the 10/21/20 FDA and  has been authorized for detection and/or diagnosis of SARS-CoV-2 by FDA under an Emergency Use Authorization (EUA).  This EUA will remain in effect (meaning this test  can be used) for the duration of  the COVID-19 declaration under Section 564(b)(1) of the Act, 21 U.S.C. section 360bbb-3(b)(1), unless the authorization is terminated or revoked sooner.      Influenza A by PCR NEGATIVE NEGATIVE Final   Influenza B by PCR NEGATIVE NEGATIVE Final    Comment: (NOTE) The Xpert Xpress SARS-CoV-2/FLU/RSV assay is intended as an aid in  the diagnosis of influenza from Nasopharyngeal swab specimens and  should not be used as a sole basis for treatment. Nasal washings and  aspirates are unacceptable for Xpert Xpress SARS-CoV-2/FLU/RSV  testing.  Fact Sheet for Patients: https://www.moore.com/  Fact Sheet for  Healthcare Providers: https://www.young.biz/  This test is not yet approved or cleared by the [place] and  has been authorized for detection and/or diagnosis of SARS-CoV-2 by  FDA under an Emergency Use Authorization (EUA). This EUA will remain  in effect (meaning this test can be used) for the duration of the  Covid-19 declaration under Section 564(b)(1) of the Act, 21  U.S.C. section 360bbb-3(b)(1), unless the authorization is  terminated or revoked.    Respiratory Syncytial Virus by PCR NEGATIVE NEGATIVE Final    Comment: (NOTE) Fact Sheet for Patients: https://www.moore.com/  Fact Sheet for Healthcare Providers: https://www.young.biz/  This test is not yet approved or cleared by the [place]  FDA and  has been authorized for detection and/or diagnosis of SARS-CoV-2 by  FDA under an Emergency Use Authorization (EUA). This EUA will remain  in effect (meaning this test can be used) for the duration of the  COVID-19 declaration under Section 564(b)(1) of the Act, 21 U.S.C.  section 360bbb-3(b)(1), unless the authorization is terminated or  revoked. Performed at [facility], [address], [place], [place] [address]     Radiology Reports DG Chest Port 1 View  Result Date: 10/19/2020 CLINICAL DATA:  Fever EXAM: PORTABLE CHEST 1 VIEW COMPARISON:  09/08/2020 FINDINGS: Airspace opacity noted in the right mid lung compatible with pneumonia. Probable patchy left mid to lower lung opacity. Low lung volumes. Heart is normal size. No effusions or pneumothorax. No acute bony abnormality. IMPRESSION: Patchy bilateral airspace opacities, greatest in the right mid lung concerning for pneumonia. Electronically Signed   By: Charlett Nose M.D.   On: 10/19/2020 17:22

## 2020-10-25 ENCOUNTER — Telehealth: Payer: Self-pay

## 2020-10-25 ENCOUNTER — Other Ambulatory Visit: Payer: Self-pay | Admitting: *Deleted

## 2020-10-25 LAB — PATHOLOGIST SMEAR REVIEW

## 2020-10-25 NOTE — Patient Outreach (Signed)
Care Coordination  10/25/2020  Nicole Case Mar 01, 2001 [id number]   An unsuccessful telephone outreach was attempted today. The patient was referred to the case management team for assistance with care management and care coordination.   Follow Up Plan: The Managed Medicaid care management team will reach out to the patient again over the next 7 days.   Estanislado Emms RN, BSN [place]  [facility]

## 2020-10-25 NOTE — Telephone Encounter (Signed)
Called patient to reschedule her cancelled appointments with MFM. She did not pick up and I could not leave a voicemail.

## 2020-10-25 NOTE — Patient Instructions (Signed)
Visit Information  [name] Case  - as a part of your Medicaid benefit, you are eligible for care management and care coordination services at no cost or copay. I was unable to reach you by phone today but would be happy to help you with your health related needs. Please feel free to call me @ [phone].   A member of the Managed Medicaid care management team will reach out to you again over the next 7 days.   Estanislado Emms RN, BSN   [facility]

## 2020-10-27 ENCOUNTER — Other Ambulatory Visit: Payer: Self-pay | Admitting: *Deleted

## 2020-10-27 NOTE — Patient Outreach (Signed)
Care Coordination  10/27/2020  Nicole Case 2001-01-12 [id number]   A second unsuccessful telephone outreach was attempted today. The patient was referred to the case management team for assistance with care management and care coordination.   Follow Up Plan: The Managed Medicaid care management team will reach out to the patient again over the next 7-14 days.  The patient has been provided with contact information for the Managed Medicaid care management team via MyChart and has been advised to call with any health related questions or concerns.   Estanislado Emms RN, BSN [place]  [facility]

## 2020-10-27 NOTE — Patient Instructions (Signed)
Visit Information  [name] Case  - as a part of your Medicaid benefit, you are eligible for care management and care coordination services at no cost or copay. I was unable to reach you by phone today but would be happy to help you with your health related needs. Please feel free to call me @ [phone].   A member of the Managed Medicaid care management team will reach out to you again over the next 7-14 days.   Estanislado Emms RN, BSN [place]  [facility]

## 2020-11-02 ENCOUNTER — Telehealth: Payer: Medicaid Other | Admitting: Obstetrics and Gynecology

## 2020-11-02 ENCOUNTER — Encounter: Payer: Self-pay | Admitting: Obstetrics and Gynecology

## 2020-11-08 ENCOUNTER — Telehealth: Payer: Medicaid Other | Admitting: Obstetrics and Gynecology

## 2020-11-08 ENCOUNTER — Other Ambulatory Visit: Payer: Self-pay | Admitting: *Deleted

## 2020-11-08 ENCOUNTER — Telehealth: Payer: Self-pay | Admitting: *Deleted

## 2020-11-08 NOTE — Telephone Encounter (Signed)
Tried calling patient for mychart appointment. No answer, unable to leave voice message.  Clovis Pu, RN

## 2020-11-08 NOTE — Patient Outreach (Cosign Needed)
Care Coordination  11/08/2020  [name] [birth year] [id number]    Medicaid Managed Care   Unsuccessful Outreach Note  11/08/2020 Name: Nicole Case MRN: [id number] DOB: 2000-12-23  Referred by: Patient, No Pcp Per Reason for referral : High Risk Managed Medicaid (Unsuccessful follow up outreach)   Nicole Case [place] is enrolled in a Managed Medicaid Health Plan: Yes  Third unsuccessful telephone outreach was attempted today. The patient was referred to the case management team for assistance with care management and care coordination. The patient's primary care provider has been notified of our unsuccessful attempts to make or maintain contact with the patient. The care management team is pleased to engage with this patient at any time in the future should he/she be interested in assistance from the care management team.   Follow Up Plan: The patient has been provided with contact information for the Managed Medicaid care management team and has been advised to call with any health related questions or concerns.  The Managed Medicaid care management team is available to follow up with the patient after provider conversation with the patient regarding recommendation for care management engagement and subsequent re-referral to the care management team.   Estanislado Emms RN, BSN [place]  [facility] RN Care Coordinator

## 2020-11-08 NOTE — Telephone Encounter (Signed)
Tried calling patient second time for 10 AM mychart appointment. No answer, unable to leave voice message.  Clovis Pu, RN

## 2020-11-10 ENCOUNTER — Other Ambulatory Visit: Payer: Self-pay

## 2020-11-10 ENCOUNTER — Other Ambulatory Visit: Payer: Medicaid Other

## 2020-11-10 DIAGNOSIS — O0990 Supervision of high risk pregnancy, unspecified, unspecified trimester: Secondary | ICD-10-CM | POA: Diagnosis not present

## 2020-11-11 LAB — HIV ANTIBODY (ROUTINE TESTING W REFLEX): HIV Screen 4th Generation wRfx: NONREACTIVE

## 2020-11-11 LAB — CBC
Hematocrit: 24.2 % — ABNORMAL LOW (ref 34.0–46.6)
Hemoglobin: 7.8 g/dL — ABNORMAL LOW (ref 11.1–15.9)
MCH: 25.5 pg — ABNORMAL LOW (ref 26.6–33.0)
MCHC: 32.2 g/dL (ref 31.5–35.7)
MCV: 79 fL (ref 79–97)
Platelets: 137 10*3/uL — ABNORMAL LOW (ref 150–450)
RBC: 3.06 x10E6/uL — ABNORMAL LOW (ref 3.77–5.28)
RDW: 25.2 % — ABNORMAL HIGH (ref 11.7–15.4)
WBC: 6.8 10*3/uL (ref 3.4–10.8)

## 2020-11-11 LAB — RPR: RPR Ser Ql: NONREACTIVE

## 2020-11-11 LAB — GLUCOSE TOLERANCE, 2 HOURS W/ 1HR
Glucose, 1 hour: 141 mg/dL (ref 65–179)
Glucose, 2 hour: 123 mg/dL (ref 65–152)
Glucose, Fasting: 117 mg/dL — ABNORMAL HIGH (ref 65–91)

## 2020-11-16 ENCOUNTER — Other Ambulatory Visit: Payer: Self-pay

## 2020-11-16 DIAGNOSIS — O24419 Gestational diabetes mellitus in pregnancy, unspecified control: Secondary | ICD-10-CM

## 2020-11-17 ENCOUNTER — Encounter: Payer: Medicaid Other | Attending: Obstetrics and Gynecology | Admitting: Registered"

## 2020-11-24 ENCOUNTER — Encounter: Payer: Self-pay | Admitting: Obstetrics and Gynecology

## 2020-11-24 ENCOUNTER — Ambulatory Visit (INDEPENDENT_AMBULATORY_CARE_PROVIDER_SITE_OTHER): Payer: Medicaid Other | Admitting: Obstetrics and Gynecology

## 2020-11-24 ENCOUNTER — Other Ambulatory Visit: Payer: Self-pay

## 2020-11-24 ENCOUNTER — Other Ambulatory Visit (INDEPENDENT_AMBULATORY_CARE_PROVIDER_SITE_OTHER): Payer: Medicaid Other | Admitting: Obstetrics and Gynecology

## 2020-11-24 VITALS — BP 119/81 | HR 128 | Wt 273.0 lb

## 2020-11-24 DIAGNOSIS — D571 Sickle-cell disease without crisis: Secondary | ICD-10-CM

## 2020-11-24 DIAGNOSIS — O99119 Other diseases of the blood and blood-forming organs and certain disorders involving the immune mechanism complicating pregnancy, unspecified trimester: Secondary | ICD-10-CM | POA: Diagnosis not present

## 2020-11-24 DIAGNOSIS — D696 Thrombocytopenia, unspecified: Secondary | ICD-10-CM

## 2020-11-24 DIAGNOSIS — D6489 Other specified anemias: Secondary | ICD-10-CM

## 2020-11-24 DIAGNOSIS — O24410 Gestational diabetes mellitus in pregnancy, diet controlled: Secondary | ICD-10-CM

## 2020-11-24 DIAGNOSIS — O99210 Obesity complicating pregnancy, unspecified trimester: Secondary | ICD-10-CM

## 2020-11-24 DIAGNOSIS — Z148 Genetic carrier of other disease: Secondary | ICD-10-CM

## 2020-11-24 DIAGNOSIS — Z23 Encounter for immunization: Secondary | ICD-10-CM

## 2020-11-24 DIAGNOSIS — O0990 Supervision of high risk pregnancy, unspecified, unspecified trimester: Secondary | ICD-10-CM

## 2020-11-24 DIAGNOSIS — Z3A32 32 weeks gestation of pregnancy: Secondary | ICD-10-CM | POA: Insufficient documentation

## 2020-11-24 DIAGNOSIS — Z8616 Personal history of COVID-19: Secondary | ICD-10-CM

## 2020-11-24 MED ORDER — SODIUM CHLORIDE 0.9 % IV SOLN: 500.0000 mg | Freq: Once | INTRAVENOUS | Status: DC

## 2020-11-24 NOTE — Patient Instructions (Addendum)
Gestational Diabetes Mellitus, Self Care When you have gestational diabetes (gestational diabetes mellitus), you must make sure your blood sugar (glucose) stays in a healthy range. You can do this with:  Nutrition.  Exercise.  Lifestyle changes.  Medicines or insulin, if needed.  Support from your doctors and others. If you get treated for this condition, it may not hurt you or your unborn baby (fetus). If you do not get treated for this condition, it may cause problems that can hurt you or your unborn baby. If you get gestational diabetes, you are:  More likely to get it if you get pregnant again.  More likely to develop type 2 diabetes in the future. How to stay aware of blood sugar   Check your blood sugar every day while you are pregnant. Check it as often as told.  Call your doctor if your blood sugar is above your goal numbers for two tests in a row. Your doctor will set personal treatment goals for you. Generally, you should have these blood sugar levels:  Before meals, or after not eating for a long time (fasting or preprandial): at or below 95 mg/dL (5.3 mmol/L).  After meals (postprandial): ? One hour after a meal: at or below 140 mg/dL (7.8 mmol/L). ? Two hours after a meal: at or below 120 mg/dL (6.7 mmol/L).  A1c (hemoglobin A1c) level: 6-6.5%. How to manage high and low blood sugar Signs of high blood sugar High blood sugar is called hyperglycemia. Know the early signs of high blood sugar. Signs may include:  Feeling: ? Thirsty. ? Hungry. ? Very tired.  Needing to pee (urinate) more than usual.  Blurry vision. Signs of low blood sugar Low blood sugar is called hypoglycemia. This is when blood sugar is at or below 70 mg/dL (3.9 mmol/L). Signs may include:  Feeling: ? Hungry. ? Worried or nervous (anxious). ? Sweaty and clammy. ? Confused. ? Dizzy. ? Sleepy. ? Sick to your stomach (nauseous).  Having: ? A fast heartbeat. ? A headache. ? A change  in your vision. ? Tingling or no feeling (numbness) around your mouth, lips, or tongue. ? Jerky movements that you cannot control (seizure).  Having trouble with: ? Moving (coordination). ? Sleeping. ? Passing out (fainting). ? Getting upset easily (irritability). Treating low blood sugar To treat low blood sugar, eat or drink something sugary right away. If you can think clearly and swallow safely, follow the 15:15 rule:  Take 15 grams of a fast-acting carb (carbohydrate). Talk with your doctor about how much you should take.  Some fast-acting carbs are: ? Sugar tablets (glucose pills). Take 3-4 glucose pills. ? 6-8 pieces of hard candy. ? 4-6 oz (120-150 mL) of fruit juice. ? 4-6 oz (120-150 mL) of regular (not diet) soda. ? 1 Tbsp (15 mL) honey or sugar.  Check your blood sugar 15 minutes after you take the carb.  If your blood sugar is still at or below 70 mg/dL (3.9 mmol/L), take 15 grams of a carb again.  If your blood sugar does not go above 70 mg/dL (3.9 mmol/L) after 3 tries, get help right away.  After your blood sugar goes back to normal, eat a meal or a snack within 1 hour. Treating very low blood sugar If your blood sugar is at or below 54 mg/dL (3 mmol/L), you have very low blood sugar (severe hypoglycemia). This is an emergency. Do not wait to see if the symptoms will go away. Get medical help right  away. Call your local emergency services (911 in the [place] If you have very low blood sugar and you cannot eat or drink, you may need a glucagon shot (injection). A family member or friend should learn how to check your blood sugar and how to give you a glucagon shot. Ask your doctor if you need to have a glucagon shot kit at home. Follow these instructions at home: Medicine  Take your insulin and diabetes medicines as told.  If your doctor says you should take more or less insulin or medicines, do this exactly as told.  Do not run out of insulin or  medicines. Food   Make healthy food choices. These include: ? Chicken, fish, egg whites, and beans. ? Oats, whole wheat, bulgur, brown rice, quinoa, and millet. ? Fresh fruits and vegetables. ? Low-fat dairy products. ? Nuts, avocado, olive oil, and canola oil.  Meet with a food specialist (dietitian). He or she can help you make an eating plan that is right for you.  Follow instructions from your doctor about what you cannot eat or drink.  Drink enough fluid to keep your pee (urine) pale yellow.  Eat healthy snacks between healthy meals.  Keep track of carbs that you eat. Do this by reading food labels and learning food serving sizes.  Follow your sick day plan when you cannot eat or drink normally. Make this plan with your doctor so it is ready to use. Activity  Exercise for 30 or more minutes a day, or as much as your doctor recommends.  Talk with your doctor before you start a new exercise or activity. Your doctor may need to tell you to change: ? How much insulin or medicines you take. ? How much food you eat. Lifestyle  Do not drink alcohol.  Do not use any tobacco products. These include cigarettes, chewing tobacco, and e-cigarettes. If you need help quitting, ask your doctor.  Learn how to deal with stress. If you need help with this, ask your doctor. Body care  Stay up to date with your shots (immunizations).  Brush your teeth and gums two times a day. Floss one or more times a day.  Go to the dentist one or more times every 6 months.  Stay at a healthy weight while you are pregnant. General instructions  Take over-the-counter and prescription medicines only as told by your doctor.  Ask your doctor about risks of high blood pressure in pregnancy (preeclampsia and eclampsia).  Share your diabetes care plan with: ? Your work or school. ? People you live with.  Check your pee for ketones: ? When you are sick. ? As told by your doctor.  Carry a card or  wear jewelry that says you have diabetes.  Keep all follow-up visits as told by your doctor. This is important. Care after giving birth  Have your blood sugar checked 4-12 weeks after you give birth.  Get checked for diabetes one or more times during 3 years. Questions to ask your doctor  Do I need to meet with a diabetes educator?  Where can I find a support group for people with gestational diabetes? Where to find more information To learn more about diabetes, visit:  American Diabetes Association: www.diabetes.org  Centers for Disease Control and Prevention (CDC): www.cdc.gov Summary  Check your blood sugar (glucose) every day while you are pregnant. Check it as often as told.  Take your insulin and diabetes medicines as told.  Keep all follow-up visits as   told by your doctor. This is important.  Have your blood sugar checked 4-12 weeks after you give birth. This information is not intended to replace advice given to you by your health care provider. Make sure you discuss any questions you have with your health care provider. Document Revised: 05/13/2018 Document Reviewed: 12/24/2015 Elsevier Patient Education  [place].  Round Ligament Pain  The round ligament is a cord of muscle and tissue that helps support the uterus. It can become a source of pain during pregnancy if it becomes stretched or twisted as the baby grows. The pain usually begins in the second trimester (13-28 weeks) of pregnancy, and it can come and go until the baby is delivered. It is not a serious problem, and it does not cause harm to the baby. Round ligament pain is usually a short, sharp, and pinching pain, but it can also be a dull, lingering, and aching pain. The pain is felt in the lower side of the abdomen or in the groin. It usually starts deep in the groin and moves up to the outside of the hip area. The pain may occur when you:  Suddenly change position, such as quickly going from a  sitting to standing position.  Roll over in bed.  Cough or sneeze.  Do physical activity. Follow these instructions at home:   Watch your condition for any changes.  When the pain starts, relax. Then try any of these methods to help with the pain: ? Sitting down. ? Flexing your knees up to your abdomen. ? Lying on your side with one pillow under your abdomen and another pillow between your legs. ? Sitting in a warm bath for 15-20 minutes or until the pain goes away.  Take over-the-counter and prescription medicines only as told by your health care provider.  Move slowly when you sit down or stand up.  Avoid long walks if they cause pain.  Stop or reduce your physical activities if they cause pain.  Keep all follow-up visits as told by your health care provider. This is important. Contact a health care provider if:  Your pain does not go away with treatment.  You feel pain in your back that you did not have before.  Your medicine is not helping. Get help right away if:  You have a fever or chills.  You develop uterine contractions.  You have vaginal bleeding.  You have nausea or vomiting.  You have diarrhea.  You have pain when you urinate. Summary  Round ligament pain is felt in the lower abdomen or groin. It is usually a short, sharp, and pinching pain. It can also be a dull, lingering, and aching pain.  This pain usually begins in the second trimester (13-28 weeks). It occurs because the uterus is stretching with the growing baby, and it is not harmful to the baby.  You may notice the pain when you suddenly change position, when you cough or sneeze, or during physical activity.  Relaxing, flexing your knees to your abdomen, lying on one side, or taking a warm bath may help to get rid of the pain.  Get help from your health care provider if the pain does not go away or if you have vaginal bleeding, nausea, vomiting, diarrhea, or painful urination. This  information is not intended to replace advice given to you by your health care provider. Make sure you discuss any questions you have with your health care provider. Document Revised: 05/08/2018 Document Reviewed: 05/08/2018  Elsevier Patient Education  [employer].

## 2020-11-24 NOTE — Progress Notes (Signed)
Hgb 7.8 , pt scheduled for iron infusion with venofer, can switch to feraheme if there is difficulty

## 2020-11-24 NOTE — Progress Notes (Signed)
ROB  2 hr GTT done on 11/10/20 Fasting was 117. Nurse has not been able to reach pt per lab notes.  Pt states she is aware of results.  VQ:MGQQ   Flu Vaccine today.   FHT Recheck : 155 provider made aware.

## 2020-11-24 NOTE — Progress Notes (Signed)
   PRENATAL VISIT NOTE  Subjective:  Nicole Case is a 19 y.o. G1P0 at [redacted]w[redacted]d being seen today for ongoing prenatal care.  She is currently monitored for the following issues for this high-risk pregnancy and has Community acquired pneumonia; Sickle cell pain crisis; Splenic sequestration; Sickle cell pain crisis (HCC); Sickle cell anemia with pain (HCC); Sickle cell disease, type [place], with unspecified crisis (HCC); Sickle-cell disease with pain (HCC); Sore throat; Fever; Urinary tract infection symptoms; Sickle cell anemia with crisis (HCC); Central chest pain; Supervision of high risk pregnancy, antepartum; Maternal morbid obesity, antepartum (HCC); Trichomonal vaginitis during pregnancy in first trimester; Anemia; Pap 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; 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  Patient doing well with no acute concerns today. She reports no complaints.  Contractions: Not present. Vag. Bleeding: None.  Movement: Present. Denies leaking of fluid.   The following portions of the patient's history were reviewed and updated as appropriate: allergies, current medications, past family history, past medical history, past social history, past surgical history and problem list. Problem list updated.  Objective:   Vitals:   11/24/20 0921  BP: 119/81  Pulse: (!) 128  Weight: 273 lb (123.8 kg)    Fetal Status: Fetal Heart Rate (bpm): 168   Movement: Present     General:  Alert, oriented and cooperative. Patient is in no acute distress.  Skin: Skin is warm and dry. No rash noted.   Cardiovascular: Normal heart rate noted  Respiratory: Normal respiratory effort, no  problems with respiration noted  Abdomen: Soft, gravid, appropriate for gestational age.  Pain/Pressure: Absent     Pelvic: Cervical exam deferred        Extremities: Normal range of motion.  Edema: None  Mental Status:  Normal mood and affect. Normal behavior. Normal judgment and thought content.   Assessment and Plan:  Pregnancy: G1P0 at [redacted]w[redacted]d  1. History of COVID-19 No current symptoms, pt declines covid vaccine  2. Thrombocytopenia affecting pregnancy (HCC) Last check plts 137 12/8  3. Carrier of genetic disorder   4. Maternal morbid obesity, antepartum (HCC)   5. Supervision of high risk pregnancy, antepartum Flu shot given today Recheck FHT 155  6. Anemia due to other cause, not classified hgb 7.8, will try to set up iron infusion 7. [redacted] weeks gestation of pregnancy   8. Diet controlled gestational diabetes mellitus (GDM) in third trimester Pt to be assigned to diabetic teaching ASAP - [place] MFM OB FOLLOW UP; Future - [place] MFM FETAL BPP WO NON STRESS; Future  9. Sickle cell disease without crisis (HCC)  - [place] M[facility] OB FOLLOW UP; Future - [place] MFM FETAL BPP WO NON STRESS; Future  Preterm labor symptoms and general obstetric precautions including but not limited to vaginal bleeding, contractions, leaking of fluid and fetal movement were reviewed in detail with the patient.  Please refer to After Visit Summary for other counseling recommendations.   Return in about 2 weeks (around 12/08/2020) for [facility], in person.   Mariel Aloe, MD

## 2020-11-29 ENCOUNTER — Ambulatory Visit: Payer: Medicaid Other | Admitting: Registered"

## 2020-11-30 ENCOUNTER — Telehealth: Payer: Self-pay

## 2020-11-30 NOTE — Progress Notes (Signed)
I provided patient with a sample meter and education yesterday. Please send Rx for lancets and strips for One Touch Verio Reflect to her pharmacy. Thanks

## 2020-11-30 NOTE — Telephone Encounter (Signed)
Call patient to inform her of second missed appointment for Dm. No answer or voice mail to leave message.

## 2020-11-30 NOTE — Telephone Encounter (Signed)
-----   Message from Warden Fillers, MD sent at 11/30/2020 10:31 AM EST ----- Regarding: missed diabetes visit Pt has missed her second diabetes teaching visit.  She needs to be rescheduled ASAP.  Pt needs to understand how important this is.

## 2020-12-01 ENCOUNTER — Other Ambulatory Visit: Payer: Self-pay | Admitting: *Deleted

## 2020-12-01 ENCOUNTER — Ambulatory Visit: Payer: Medicaid Other | Attending: Obstetrics and Gynecology

## 2020-12-01 ENCOUNTER — Ambulatory Visit: Payer: Medicaid Other | Admitting: *Deleted

## 2020-12-01 ENCOUNTER — Other Ambulatory Visit: Payer: Self-pay

## 2020-12-01 ENCOUNTER — Encounter: Payer: Self-pay | Admitting: *Deleted

## 2020-12-01 ENCOUNTER — Encounter (HOSPITAL_COMMUNITY): Payer: Medicaid Other

## 2020-12-01 DIAGNOSIS — E669 Obesity, unspecified: Secondary | ICD-10-CM

## 2020-12-01 DIAGNOSIS — O24410 Gestational diabetes mellitus in pregnancy, diet controlled: Secondary | ICD-10-CM | POA: Insufficient documentation

## 2020-12-01 DIAGNOSIS — O0990 Supervision of high risk pregnancy, unspecified, unspecified trimester: Secondary | ICD-10-CM

## 2020-12-01 DIAGNOSIS — O99013 Anemia complicating pregnancy, third trimester: Secondary | ICD-10-CM

## 2020-12-01 DIAGNOSIS — Z3A33 33 weeks gestation of pregnancy: Secondary | ICD-10-CM

## 2020-12-01 DIAGNOSIS — D571 Sickle-cell disease without crisis: Secondary | ICD-10-CM

## 2020-12-01 DIAGNOSIS — Z148 Genetic carrier of other disease: Secondary | ICD-10-CM

## 2020-12-01 DIAGNOSIS — O99213 Obesity complicating pregnancy, third trimester: Secondary | ICD-10-CM | POA: Diagnosis not present

## 2020-12-04 NOTE — L&D Delivery Note (Addendum)
OB/GYN Faculty Practice Delivery Note  Nicole Case is a 20 y.o. G1P0 at [redacted]w[redacted]d. She was admitted for IOL due to DFM.  ROM: 18h 55m with clear fluid GBS Status: pos, adequate ppx PCN Maximum Maternal Temperature: 98.9*F  Labor Progress: . Patient started induction on evening of 12/30/20. She received cytotec x3 and received a FB that resulted in a dilation of 2cm. Pitocin was started on 12/31/20 from 11 AM to 2100, without change pitocin was stopped and cytotec x3 given again. Cook's catheter placed on 01/01/21 at 10AM and removed 2 hours later. Pitocin restarted at 1145 AM on 01/01/21. Patient received AROM and IUPC, she was unchanged and not adequate at subsequent check on evening of 01/01/21. She was given a pitocin break, and restarted at midnight on 12/23/20. She then progressed to complete.  Delivery Date/Time: January 02, 2021 Delivery: Called to room and patient was complete and pushing. Head delivered LOA. Nuchal cord present and delivered through. Shoulder and body delivered in usual fashion. Infant with spontaneous cry, placed on mother's abdomen, dried and stimulated. Cord clamped x 2 after 1-minute delay, and cut by FOB. Cord blood drawn. Placenta delivered spontaneously with gentle cord traction. Fundus firm with massage and Pitocin, but became intermittently boggy as soon as massage stopped. Noted brisk uterine bleeding, patient received cytotec 1000mg  PR, TXA 1g IV, and hemabate IM with resolution of bleeding. MD present at bedside. Labia, perineum, vagina, and cervix inspected with periurethral, left labial, and first degree perineal.   Placenta: Spontaneous, intact, 3VC Complications: brisk bleeding Lacerations: periurethral, left labial, 1st degree perineal- repaired   EBL: 758 Analgesia: epidural  Infant: viable female  APGARs 8,9  weight pending per medical chart  to newborn/skin-to-skin  Donavan Foil, MD [facility], PGY-2 01/02/2021, 8:23 AM   I  was gloved and present for entirety of delivery. I agree with the findings and the plan of care as documented in the resident's note.  01/04/2021, MD [facility] Family Medicine Fellow, [facility] for [facility], [facility]

## 2020-12-06 ENCOUNTER — Other Ambulatory Visit (INDEPENDENT_AMBULATORY_CARE_PROVIDER_SITE_OTHER): Payer: Medicaid Other | Admitting: Obstetrics and Gynecology

## 2020-12-06 NOTE — Progress Notes (Signed)
venofer orders re-entered

## 2020-12-07 ENCOUNTER — Encounter
Admission: RE | Admit: 2020-12-07 | Discharge: 2020-12-07 | Disposition: A | Discharge status: Home / Self Care | Payer: Medicaid Other | Source: Ambulatory Visit | Attending: Obstetrics and Gynecology | Admitting: Obstetrics and Gynecology

## 2020-12-07 ENCOUNTER — Other Ambulatory Visit: Payer: Self-pay

## 2020-12-07 DIAGNOSIS — O99013 Anemia complicating pregnancy, third trimester: Secondary | ICD-10-CM | POA: Insufficient documentation

## 2020-12-07 DIAGNOSIS — D571 Sickle-cell disease without crisis: Secondary | ICD-10-CM | POA: Insufficient documentation

## 2020-12-07 MED ORDER — IRON SUCROSE 20 MG/ML IV SOLN
300.0000 mg | INTRAVENOUS | Status: DC
Start: 2020-12-07 — End: 2020-12-08
  Administered 2020-12-07: 300 mg via INTRAVENOUS
  Filled 2020-12-07: qty 15

## 2020-12-07 MED ORDER — SODIUM CHLORIDE 0.9 % IV SOLN
500.0000 mg | Freq: Once | INTRAVENOUS | Status: DC
  Filled 2020-12-07: qty 25

## 2020-12-08 ENCOUNTER — Ambulatory Visit: Payer: Medicaid Other | Attending: Obstetrics and Gynecology

## 2020-12-08 ENCOUNTER — Ambulatory Visit: Payer: Medicaid Other

## 2020-12-09 ENCOUNTER — Encounter: Payer: Self-pay | Admitting: Obstetrics and Gynecology

## 2020-12-09 ENCOUNTER — Ambulatory Visit (INDEPENDENT_AMBULATORY_CARE_PROVIDER_SITE_OTHER): Payer: Medicaid Other | Admitting: Obstetrics and Gynecology

## 2020-12-09 ENCOUNTER — Other Ambulatory Visit: Payer: Self-pay

## 2020-12-09 VITALS — BP 104/72 | HR 106

## 2020-12-09 DIAGNOSIS — O99210 Obesity complicating pregnancy, unspecified trimester: Secondary | ICD-10-CM

## 2020-12-09 DIAGNOSIS — D6489 Other specified anemias: Secondary | ICD-10-CM

## 2020-12-09 DIAGNOSIS — O99119 Other diseases of the blood and blood-forming organs and certain disorders involving the immune mechanism complicating pregnancy, unspecified trimester: Secondary | ICD-10-CM

## 2020-12-09 DIAGNOSIS — O24410 Gestational diabetes mellitus in pregnancy, diet controlled: Secondary | ICD-10-CM

## 2020-12-09 DIAGNOSIS — D696 Thrombocytopenia, unspecified: Secondary | ICD-10-CM

## 2020-12-09 DIAGNOSIS — Z8616 Personal history of COVID-19: Secondary | ICD-10-CM

## 2020-12-09 DIAGNOSIS — O0990 Supervision of high risk pregnancy, unspecified, unspecified trimester: Secondary | ICD-10-CM

## 2020-12-09 NOTE — Progress Notes (Signed)
   PRENATAL VISIT NOTE  Subjective:  Nicole Case is a 20 y.o. G1P0 at [redacted]w[redacted]d being seen today for ongoing prenatal care.  She is currently monitored for the following issues for this high-risk pregnancy and has Community acquired pneumonia; Sickle cell pain crisis; Splenic sequestration; Sickle cell pain crisis (HCC); Sickle cell anemia with pain (HCC); Sickle cell disease, type [place], with unspecified crisis (HCC); Sickle-cell disease with pain (HCC); Sore throat; Fever; Urinary tract infection symptoms; Sickle cell anemia with crisis (HCC); Central chest pain; Supervision of high risk pregnancy, antepartum; Maternal morbid obesity, antepartum (HCC); Trichomonal vaginitis during pregnancy in first trimester; Anemia; Pap 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; 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  Patient reports occasional contractions.  Contractions: Irritability. Vag. Bleeding: None.  Movement: Present. Denies leaking of fluid.   The following portions of the patient's history were reviewed and updated as appropriate: allergies, current medications, past family history, past medical history, past social history, past surgical history and problem list.   Objective:   Vitals:   12/09/20 1532  BP: 104/72  Pulse: (!) 106    Fetal Status: Fetal Heart Rate (bpm): 156 Fundal Height: 35 cm Movement: Present     General:  Alert, oriented and cooperative. Patient is in no acute distress.  Skin: Skin is warm and dry. No rash noted.   Cardiovascular: Normal heart rate noted  Respiratory: Normal respiratory effort, no problems with respiration noted  Abdomen: Soft, gravid, appropriate for gestational age.   Pain/Pressure: Present     Pelvic: Cervical exam deferred        Extremities: Normal range of motion.  Edema: None  Mental Status: Normal mood and affect. Normal behavior. Normal judgment and thought content.   Assessment and Plan:  Pregnancy: G1P0 at [redacted]w[redacted]d 1. Supervision of high risk pregnancy, antepartum Patient is doing well reporting occasional contractions Patient plans depo-provera for contraception Patient undecided on pediatrician Cultures next visit  2. Diet controlled gestational diabetes mellitus (GDM) in third trimester Patient missed education appointment. Will ensure patient is rescheduled Patient has not really been testing CBGs - Referral to Nutrition and Diabetes Services  3. Maternal morbid obesity, antepartum (HCC) Continue ASA  4. Thrombocytopenia affecting pregnancy (HCC)   5. Anemia due to other cause, not classified Venofer ordered  6. History of COVID-19 Currently asymptomatic  Preterm labor symptoms and general obstetric precautions including but not limited to vaginal bleeding, contractions, leaking of fluid and fetal movement were reviewed in detail with the patient. Please refer to After Visit Summary for other counseling recommendations.   Return in about 2 weeks (around 12/23/2020) for in person, ROB, High risk.  Future Appointments  Date Time Provider Department Center  12/14/2020  1:00 PM [facility] None  12/15/2020  3:45 PM [facility] NURSE [facility]  12/15/2020  4:00 PM [facility] NST [facility]  12/23/2020  3:15 PM [facility] NURSE [facility]  12/23/2020  3:30 PM [facility] US3 [facility]  12/29/2020  3:30 PM [facility] NURSE [facility]  12/29/2020  3:45 PM [facility] US4 [facility]    Kaedence Connelly, MD

## 2020-12-09 NOTE — Patient Instructions (Signed)
AREA PEDIATRIC/FAMILY PRACTICE PHYSICIANS  [place] ([address]) . [facility] o Chambliss, MD; Eniola, MD; Hale, MD; Hensel, MD; McDiarmid, MD; McIntyer, MD; Neal, MD; Walden, MD o [address] [place], [place] [address] o [phone] o Mon-Fri 8:30-12:30, 1:30-5:00 o Providers come to see babies at [facility] o Accepting Medicaid . [facility] o Limited providers who accept newborns: Koirala, MD; Morrow, MD; Wolters, MD o [address], [place], [place] [address] o [phone] o Mon-Fri 8:00-5:30 o Babies seen by providers at [facility] o Does NOT accept Medicaid o Please call early in hospitalization for appointment (limited availability)  . [facility] o Mulberry, MD o [address] [place], [place] [address] o [phone] o Mon, Tue, Thur, Fri 8:30-5:00, Wed 10:00-7:00 (closed 1-2pm) o Babies seen by [facility] providers o Accepting Medicaid . [facility] o Rubin, MD o [address], [place], [place] [address] o [phone] o Mon-Fri 8:30-5:00, Sat 8:30-12:00 o Provider comes to see babies at [facility] o Accepting Medicaid o Must have been referred from current patients or contacted office prior to delivery . [facility] ([facility]) o Brown, MD; Chandler, MD; Ettefagh, MD; Grant, MD; Lester, MD; McCormick, MD; McQueen, MD; Prose, MD; Simha, MD; Stanley, MD; Stryffeler, NP; Tebben, NP o [address], [place], [place] [address] o [phone] o Mon, Tue, Thur, Fri 8:30-5:30, Wed 9:30-5:30, Sat 8:30-12:30 o Babies seen by [facility] providers o Accepting Medicaid o Only accepting infants of first-time parents or siblings of current patients o Hospital discharge coordinator will make follow-up appointment . Jack Amos o [address],  [place], [place]  [address] o [phone]   Fax - [phone] . [facility] o [address], [place], [place]  [address] o Phone - [phone]   Fax - [phone] . Shilpa Gosrani o [address], [place], [place]  [address] o [phone]  [place] ([address]) . [facility] o Bates, MD; Brassfield, MD; Cooper, Cox, MD; MD; Davis, MD; Dovico, MD; Ettefaugh, MD; Little, MD; Lowe, MD; Keiffer, MD; Melvin, MD; Sumner, MD; Williams, MD o [address], [place], [place] [address] o [phone] o Mon-Fri 8:30-5:00 (extended evenings Mon-Thur as needed), Sat-Sun 10:00-1:00 o Providers come to see babies at [facility] o Accepting Medicaid for families of first-time babies and families with all children in the household age 3 and under. Must register with office prior to making appointment (M-F only). . [facility] o Henson, NP; Knapp, MD; Lalonde, MD; Tysinger, PA o [address] [place], [place] [address] o [phone] o Mon-Fri 8:00-5:00 o Babies seen by providers at [facility] o Does NOT accept Medicaid/Commercial Insurance Only . [facility] ([facility]  o Artis, MD; Barnes, MD; Bratton, MD; Coccaro, MD; Lockett Gardner, MD; Kramer, MD; Marshall, MD; Netherton, MD; Poleto, MD; Skinner, MD o [address] [place], [place] [address] o [phone] o Mon-Fri 8:30-5:30, Sat (Oct.-Mar.) 9:00-1:00 o Babies seen by providers at [facility] o Accepting Medicaid  [place] ([address]) . [facility] o Reid, MD; Warner, MD o [address], [place], [place] [address] o [phone] o Mon-Fri 8:30-5:00, Sat 8:30-12:00 o Providers come to see babies at [facility] o Does NOT accept Medicaid . [facility] o Becker, PA; Hagler, MD; Scifres, PA; Sun, MD; Swayne, MD o [address],  [place], [place] [address] o [phone] o Mon-Fri 8:00-5:00 o Babies seen by providers at [facility] o Does NOT accept Medicaid o Only accepting babies of parents who are patients o Please call early in hospitalization for appointment (limited availability) . [facility] o Clark, MD; Frye, MD; Kelleher, MD; Mack, NP; Miller, MD; O'Keller, MD; Patterson, NP; Pudlo, MD; Puzio, MD; Thomas, MD; Tucker, MD; Twiselton, MD o [address], [place], [place] [address] o [phone] o Mon-Fri 8:00-5:00, Sat 9:00-12:00 o Providers come to see babies at [facility] o Does NOT accept Medicaid  [place] ([address]) . [facility] o Limited providers accepting new patients: Brake, NP; Wharton, PA o [address], [place], [place] [address] o [phone] o Mon-Fri 8:00-5:00 o Babies seen by providers at [facility] o Does NOT accept Medicaid o Only accepting babies of parents who are patients o Please call early in hospitalization for appointment (limited availability) . [facility] o Gay, MD; Quinlan, MD o [address] [place], [place] [address] o [phone] (press 1 to schedule appointment) o Mon-Fri 8:00-5:00 o Providers come to see babies at [facility] o Does NOT accept Medicaid . [facility] o Mazer, MD o [address] [place], [place] [address] o [phone] o Mon-Fri 8:30-5:00 (lunch 12:30-1:00), extended hours by appointment only Wed 5:00-6:30 o Babies seen by [facility] providers o Accepting Medicaid . [facility] o Banks, MD; Jordan, MD; Koberlein, MD o [address], [place], [place] [address] o [phone] o Mon-Fri 8:00-5:00 o Babies seen by [facility] providers o Does NOT accept Medicaid . [facility] o Parker, MD; Hunter, MD; Wallace, DO o [address] [place], [place]  [address] o [phone] o Mon-Fri 8:00-5:00 o Babies seen by [facility] providers o Does NOT accept Medicaid . [facility] o Brandon, PA; Brecken, PA; Christy, NP; Dees, MD; DeClaire, MD; DeWeese, MD; Hansen, NP; Mills, NP; Parrish, NP; Smoot, NP; Summer, MD; Vapne, MD o [address] [place], [place] [address] o [phone] o Mon-Fri 8:30-5:00, Sat 10:00-1:00 o Providers come to see babies at [facility] o Does NOT accept Medicaid o Free prenatal information session Tuesdays at 4:45pm . [facility] o Bouska, MD; Gordon, PA; Jeffery, PA; Weber, PA o [address] [place] [address] o [phone] o Mon-Fri 7:30-5:30 o Babies seen by [facility] providers . [facility] o [address], [place], [place]  [address] o [phone]   Fax - [phone]  [place] ([address] & [address]) . [facility] o [place]ese, MD o [address] [place], [place] [address] o [phone] o Mon-Thur 8:00-6:00 o Providers come to see babies at [facility] o Accepting Medicaid . [facility] o Anderson, NP; Badger, MD; Beal, PA; Spencer, PA o [address] [place], [place] [address] o [phone] o Mon-Thur 7:30-7:30, Fri 7:30-4:30 o Babies seen by [facility] providers o Accepting Medicaid . [facility] o Agbuya, MD; Klett, NP; Romgoolam, MD o [address], , [place] [address] o [phone] o Mon-Fri 8:30-5:00, Sat 8:30-12:00 o Providers come to see babies at [facility] o Accepting Medicaid o Must have "Meet & Greet" appointment at office prior to delivery . [facility] o McCord,   MD; Wallace, MD; Wood, MD o [address], [place], [place] [address] o [phone] o Mon-Wed 8:00-6:00, Thur-Fri 8:00-5:00, Sat 9:00-12:00 o Providers come to  see babies at [facility] o Does NOT accept Medicaid o Only accepting siblings of current patients . [facility]  o [address], [place], [place]  [address] o [phone]   Fax - [phone] . [facility] o [address], [place], [place]  [address] o [phone]   Fax - [phone]  [place] ([address] & [address]) . [facility] o Cirigliano, DO; Matthews, DO o [address] [place], [place] [address] o [phone] o Mon-Fri 7:00-5:00 o Babies seen by [facility] providers o Does NOT accept Medicaid . [facility] o Briscoe, MD; Howley, PA; Moreira, PA o [address], [place], [place] [address] o [phone] o Mon-Fri 8:00-5:00 o Babies seen by [facility] providers o Accepting Medicaid . [facility] o Boyd, MD; Church, PA; Jones, NP; Osborn, PA o [address], [place], [place] [address] o [phone] o Mon-Fri 8:00-5:00 o Babies seen by providers at [facility] o Accepting Medicaid  [place] ([address]) . [facility] [place] o Wendling, DO o [address] [place], [place] [address] o [phone] o Mon-Fri 8:00-5:00 o Babies seen by [facility] providers o Does NOT accept Medicaid o Limited availability, please call early in hospitalization to schedule follow-up . [facility] o Calderon, PA; Cummings, MD; Dillard, MD; Martin, PA; Olson, MD; VanDeven, PA o [address], [place], [place] [address] o [phone] o Mon-Fri 8:30-5:00, Sat 9:00-12:00 o Babies seen by providers at [facility] o Accepting Medicaid o Please register online then schedule online or call office o [email or web address] . [facility] o Hunter, NP; Kumar, MD; [place] Rogers, PA o [address], [place], [place] [address] o [phone] o Mon-Fri 8:00-5:00 o Babies seen by providers at [facility] o Accepting Medicaid . [facility] ([facility] o [place], MD; Kristi Fleenor, NP; West, MD o [address], [place], [place] [address] o [phone] o Mon-Fri 8:00-5:30, Sat&Sun by appointment (phones open at 8:30) o Babies seen by [facility] providers o Accepting Medicaid o Must be a first-time baby or sibling of current patient . [facility] [place]  o [address], [place], [place]  [address] o [phone]   Fax - [phone]  [place] ([address]) . [facility] o Brown, PA; Cowen, PA; Rice, MD; Helton, PA; Spry, MD o [address] [place], [place] [address] o [phone] o Mon-Thur 8:00-7:00, Fri 8:00-5:00, Sat 8:00-12:00, Sun 9:00-12:00 o Babies seen by [facility] providers o Accepting Medicaid . [facility] [place] o Coe-Goins, MD; Marshall, MD; Pierre-Louis, MD o [address] [place] [address], [place], [place] [address] o [phone] o Mon-Thur 8:00-5:00 o Babies seen by providers at [facility] o Accepting Medicaid . [facility] o Bratton, MD; Coe-Goins, MD; Hayes, MD; Lewis, MD; List, MD; Lott, MD; Marshall, MD; Moran, MD; O'Neal, MD; Pierre-Louis, MD; Pitonzo, MD; Scholer, MD; Spangle, MD o [address] [place], [place]   [address] o [phone] o Mon-Fri 8:00-5:30, Sat (Oct.-Mar.) 9:00-1:00 o Babies seen by providers at [facility] o Accepting Medicaid o Must fill out new patient packet, available online at [email or web address] . [facility] ([facility] o Friddle, NP; Harris, NP; Kelly, NP; Logan, MD;  Melvin, PA; Poth, MD; Ramadoss, MD; Stanton, NP o [address], [place], [place] [address] o [phone] o Mon-Thur 8:00-5:30, Fri 8:00-5:00 o Babies seen by providers at [facility] o Accepting Medicaid  [place] ([address]) . [facility] o Dixon, PA; Warsaw, MD; Pickard, MD; Tapia, PA o [address], [place], [place] [address] o [phone] o Mon-Fri 8:00-5:00 o Babies seen by providers at [facility] o Accepting Medicaid   [place] ([address]) . [facility] [place] o Masneri, DO; Meyers, MD; Nelson, PA o [address], [place], [place] [address] o [phone] o Mon-Fri 8:00-5:00 o Babies seen by providers at [facility] o Does NOT accept Medicaid o Limited appointment availability, please call early in hospitalization  . [facility] [place] o Kunedd, DO; McGowen, MD o [address], [place], [place] [address] o [phone] o Mon-Fri 8:00-5:00 o Babies seen by [facility] providers o Does NOT accept Medicaid . [facility] [place] o Cameron, MD; MacDonald, MD; Michaels, PA; Nayak, MD o [address] [place] [address], [place], [place] [address] o [phone] o Mon-Fri 8:00-5:00 o After hours clinic ([address] [place], [place] [address]) [phone] Mon-Fri 5:00-8:00, Sat 12:00-6:00, Sun 10:00-4:00 o Babies seen by [facility] providers o Accepting Medicaid . [facility] [place] o [address], [place], [place]  [address] o [phone]   Fax - [phone]  [place] ([address]) . [facility] [place] o Andy, MD o [address], [place], [place] [address] o [phone] o Mon-Fri 8:00-5:00 o Babies seen by [facility] providers o Does NOT accept Medicaid . [facility] [place] ([facility] [place]) o Eksir, MD o [address], [place], [place]  [address] o [phone] o Mon-Thur 8:00-7:00, Fri 8:00-5:00, Sat 8:00-12:00 o Babies seen by providers at [facility] o Accepting Medicaid - but does not have vaccinations in office (must be received elsewhere) o Limited availability, please call early in hospitalization  [place] ([address]) . [facility]  o Charlene Flemming, MD o [address], [place] [address] o [phone]  Fax [phone]   

## 2020-12-09 NOTE — Progress Notes (Signed)
HOB, c/o stomach pain

## 2020-12-14 ENCOUNTER — Inpatient Hospital Stay (HOSPITAL_COMMUNITY): Admission: RE | Admit: 2020-12-14 | Payer: Medicaid Other | Source: Ambulatory Visit

## 2020-12-15 ENCOUNTER — Encounter: Payer: Self-pay | Admitting: *Deleted

## 2020-12-15 ENCOUNTER — Other Ambulatory Visit: Payer: Self-pay

## 2020-12-15 ENCOUNTER — Ambulatory Visit: Payer: Medicaid Other | Attending: Obstetrics and Gynecology | Admitting: *Deleted

## 2020-12-15 ENCOUNTER — Ambulatory Visit: Payer: Medicaid Other | Admitting: *Deleted

## 2020-12-15 DIAGNOSIS — O24410 Gestational diabetes mellitus in pregnancy, diet controlled: Secondary | ICD-10-CM | POA: Insufficient documentation

## 2020-12-15 DIAGNOSIS — Z3A35 35 weeks gestation of pregnancy: Secondary | ICD-10-CM | POA: Insufficient documentation

## 2020-12-15 DIAGNOSIS — O0990 Supervision of high risk pregnancy, unspecified, unspecified trimester: Secondary | ICD-10-CM

## 2020-12-15 NOTE — Procedures (Signed)
Nicole Case 2001-01-05 [redacted]w[redacted]d  Fetus A Non-Stress Test Interpretation for 12/15/20  Indication: Gestational Diabetes medication controlled  Fetal Heart Rate A Mode: External Baseline Rate (A): 150 bpm Variability: Moderate Accelerations: 15 x 15,10 x 10 Decelerations: None Multiple birth?: No  Uterine Activity Mode: Palpation,Toco Contraction Frequency (min): 3-6 Contraction Duration (sec): 50-60 Contraction Quality: Mild Resting Tone Palpated: Relaxed Resting Time: Adequate  Interpretation (Fetal Testing) Nonstress Test Interpretation: Reactive Comments: Reviewed tracing with Dr. Grace Bushy

## 2020-12-21 ENCOUNTER — Telehealth: Payer: Medicaid Other

## 2020-12-21 ENCOUNTER — Encounter: Payer: Self-pay | Admitting: Registered"

## 2020-12-21 ENCOUNTER — Other Ambulatory Visit: Payer: Self-pay

## 2020-12-21 NOTE — Progress Notes (Signed)
RD sent email to patient with handouts and also sent link to visit. Appointment was at 3:15. RD waited until 3:37 before ending the call

## 2020-12-23 ENCOUNTER — Ambulatory Visit (INDEPENDENT_AMBULATORY_CARE_PROVIDER_SITE_OTHER): Payer: Medicaid Other | Admitting: Obstetrics and Gynecology

## 2020-12-23 ENCOUNTER — Other Ambulatory Visit: Payer: Self-pay

## 2020-12-23 ENCOUNTER — Ambulatory Visit: Payer: Medicaid Other | Admitting: *Deleted

## 2020-12-23 ENCOUNTER — Ambulatory Visit: Payer: Medicaid Other | Attending: Obstetrics and Gynecology

## 2020-12-23 ENCOUNTER — Encounter: Payer: Self-pay | Admitting: *Deleted

## 2020-12-23 ENCOUNTER — Other Ambulatory Visit (HOSPITAL_COMMUNITY)
Admission: RE | Admit: 2020-12-23 | Discharge: 2020-12-23 | Disposition: A | Discharge status: Home / Self Care | Payer: Medicaid Other | Source: Ambulatory Visit | Attending: Obstetrics and Gynecology | Admitting: Obstetrics and Gynecology

## 2020-12-23 ENCOUNTER — Encounter: Payer: Self-pay | Admitting: Obstetrics and Gynecology

## 2020-12-23 VITALS — BP 114/77 | HR 114 | Wt 275.0 lb

## 2020-12-23 DIAGNOSIS — E669 Obesity, unspecified: Secondary | ICD-10-CM

## 2020-12-23 DIAGNOSIS — O99013 Anemia complicating pregnancy, third trimester: Secondary | ICD-10-CM | POA: Insufficient documentation

## 2020-12-23 DIAGNOSIS — O0990 Supervision of high risk pregnancy, unspecified, unspecified trimester: Secondary | ICD-10-CM | POA: Insufficient documentation

## 2020-12-23 DIAGNOSIS — O24410 Gestational diabetes mellitus in pregnancy, diet controlled: Secondary | ICD-10-CM

## 2020-12-23 DIAGNOSIS — Z148 Genetic carrier of other disease: Secondary | ICD-10-CM | POA: Diagnosis not present

## 2020-12-23 DIAGNOSIS — O99213 Obesity complicating pregnancy, third trimester: Secondary | ICD-10-CM

## 2020-12-23 DIAGNOSIS — O99210 Obesity complicating pregnancy, unspecified trimester: Secondary | ICD-10-CM

## 2020-12-23 DIAGNOSIS — D6489 Other specified anemias: Secondary | ICD-10-CM

## 2020-12-23 DIAGNOSIS — D571 Sickle-cell disease without crisis: Secondary | ICD-10-CM | POA: Insufficient documentation

## 2020-12-23 DIAGNOSIS — Z3A36 36 weeks gestation of pregnancy: Secondary | ICD-10-CM | POA: Diagnosis not present

## 2020-12-23 NOTE — Progress Notes (Signed)
   PRENATAL VISIT NOTE  Subjective:  Nicole Case is a 20 y.o. G1P0 at [redacted]w[redacted]d being seen today for ongoing prenatal care.  She is currently monitored for the following issues for this high-risk pregnancy and has Community acquired pneumonia; Sickle cell pain crisis; Splenic sequestration; Sickle cell pain crisis (HCC); Sickle cell anemia with pain (HCC); Sickle cell disease, type [place], with unspecified crisis (HCC); Sickle-cell disease with pain (HCC); Sore throat; Fever; Urinary tract infection symptoms; Sickle cell anemia with crisis (HCC); Central chest pain; Supervision of high risk pregnancy, antepartum; Maternal morbid obesity, antepartum (HCC); Trichomonal vaginitis during pregnancy in first trimester; Anemia; Pap 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; 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  Patient reports no complaints.  Contractions: Irregular. Vag. Bleeding: None.  Movement: Present. Denies leaking of fluid.   The following portions of the patient's history were reviewed and updated as appropriate: allergies, current medications, past family history, past medical history, past social history, past surgical history and problem list.   Objective:   Vitals:   12/23/20 1406  BP: 114/77  Pulse: (!) 114  Weight: 275 lb (124.7 kg)    Fetal Status: Fetal Heart Rate (bpm): 150 Fundal Height: 37 cm Movement: Present  Presentation: Vertex  General:  Alert, oriented and cooperative. Patient is in no acute distress.  Skin: Skin is warm and dry. No rash noted.   Cardiovascular: Normal heart rate noted  Respiratory: Normal respiratory effort, no problems with respiration noted  Abdomen: Soft, gravid,  appropriate for gestational age.  Pain/Pressure: Present     Pelvic: Cervical exam performed in the presence of a chaperone Dilation: Closed Effacement (%): Thick Station: Ballotable  Extremities: Normal range of motion.     Mental Status: Normal mood and affect. Normal behavior. Normal judgment and thought content.   Assessment and Plan:  Pregnancy: G1P0 at [redacted]w[redacted]d 1. Supervision of high risk pregnancy, antepartum Patient is doing well without complaints Cultures today Patient undecided on pediatrician  2. Diet controlled gestational diabetes mellitus (GDM) in third trimester Patient missed education appointment A1c ordered today Discussed importance of keeping these appointment in order to optimize maternal/fetal health  3. Maternal morbid obesity, antepartum (HCC) Continue ASA  4. Anemia due to other cause, not classified S/p iron infusion  Preterm labor symptoms and general obstetric precautions including but not limited to vaginal bleeding, contractions, leaking of fluid and fetal movement were reviewed in detail with the patient. Please refer to After Visit Summary for other counseling recommendations.   Return in about 1 week (around 12/30/2020) for in person, ROB, High risk.  Future Appointments  Date Time Provider Department Center  12/23/2020  3:15 PM [facility] NURSE [facility]  12/23/2020  3:30 PM [facility]  12/29/2020  3:30 PM [facility] NURSE [facility]  12/29/2020  3:45 PM [facility]4 [facility]    Catalina Antigua, MD

## 2020-12-24 ENCOUNTER — Encounter: Payer: Self-pay | Admitting: *Deleted

## 2020-12-24 ENCOUNTER — Other Ambulatory Visit: Payer: Self-pay | Admitting: *Deleted

## 2020-12-24 LAB — CERVICOVAGINAL ANCILLARY ONLY
Chlamydia: NEGATIVE
Comment: NEGATIVE
Comment: NORMAL
Neisseria Gonorrhea: NEGATIVE

## 2020-12-24 LAB — HEMOGLOBIN A1C
Est. average glucose Bld gHb Est-mCnc: <74 mg/dL
Hgb A1c MFr Bld: <4.2 % — ABNORMAL LOW (ref 4.8–5.6)

## 2020-12-24 MED ORDER — ACCU-CHEK GUIDE VI STRP: ORAL_STRIP | 4 refills | Status: DC

## 2020-12-24 MED ORDER — ACCU-CHEK GUIDE W/DEVICE KIT: 1.0000 | PACK | Freq: Once | 0 refills | Status: AC

## 2020-12-24 MED ORDER — ACCU-CHEK SOFTCLIX LANCETS MISC: 12 refills | Status: DC

## 2020-12-24 NOTE — Progress Notes (Signed)
Diabetic supplies ordered today and pt message sent to notify.

## 2020-12-26 LAB — CULTURE, BETA STREP (GROUP B ONLY): Strep Gp B Culture: POSITIVE — AB

## 2020-12-27 ENCOUNTER — Encounter: Payer: Self-pay | Admitting: Obstetrics and Gynecology

## 2020-12-27 ENCOUNTER — Encounter: Payer: Self-pay | Admitting: Registered"

## 2020-12-27 ENCOUNTER — Encounter: Payer: Medicaid Other | Attending: Obstetrics and Gynecology | Admitting: Registered"

## 2020-12-27 ENCOUNTER — Other Ambulatory Visit: Payer: Self-pay

## 2020-12-27 DIAGNOSIS — O99820 Streptococcus B carrier state complicating pregnancy: Secondary | ICD-10-CM | POA: Insufficient documentation

## 2020-12-27 DIAGNOSIS — O24410 Gestational diabetes mellitus in pregnancy, diet controlled: Secondary | ICD-10-CM | POA: Diagnosis not present

## 2020-12-27 NOTE — Patient Instructions (Addendum)
Ideas for things to get at grocery store to help create balanced meals and snacks Strawberries, grapes, oranges String cheese, cheddar cheese Eggs Peanut butter Nuts  Heart burn: lower fat meal, smaller meals, and not too close to bedtime; tums, pepcid  Look for chair exercise on youtube

## 2020-12-27 NOTE — Progress Notes (Signed)
Patient was seen on 12/27/20 for Gestational Diabetes self-management class [facility]. The following learning objectives were met by the patient during this course:   States the definition of Gestational Diabetes  States why dietary management is important in controlling blood glucose  Describes the effects each nutrient has on blood glucose levels  Demonstrates ability to create a balanced meal plan  Demonstrates carbohydrate counting   States when to check blood glucose levels  Demonstrates proper blood glucose monitoring techniques  States the effect of stress and exercise on blood glucose levels  States the importance of limiting caffeine and abstaining from alcohol and smoking  Blood glucose monitor given: Acc[employer]-chek [employer] #[id number] Exp: 01/03/2022 CBG: 102 mg/dL (fasting)  Patient instructed to monitor glucose levels: FBS: 60 - <95; 1 hour: <140; 2 hour: <120  Patient received handouts:  Nutrition Diabetes and Pregnancy, including carb counting list  Patient will be seen for follow-up as needed.

## 2020-12-28 ENCOUNTER — Other Ambulatory Visit: Payer: Self-pay | Admitting: Registered"

## 2020-12-28 DIAGNOSIS — O24410 Gestational diabetes mellitus in pregnancy, diet controlled: Secondary | ICD-10-CM

## 2020-12-29 ENCOUNTER — Encounter: Payer: Medicaid Other | Admitting: Obstetrics and Gynecology

## 2020-12-29 ENCOUNTER — Ambulatory Visit: Payer: Medicaid Other

## 2020-12-30 ENCOUNTER — Ambulatory Visit: Payer: Medicaid Other | Admitting: *Deleted

## 2020-12-30 ENCOUNTER — Encounter: Payer: Self-pay | Admitting: *Deleted

## 2020-12-30 ENCOUNTER — Ambulatory Visit (HOSPITAL_BASED_OUTPATIENT_CLINIC_OR_DEPARTMENT_OTHER): Payer: Medicaid Other

## 2020-12-30 ENCOUNTER — Inpatient Hospital Stay (HOSPITAL_COMMUNITY)
Admission: AD | Admit: 2020-12-30 | Discharge: 2021-01-04 | DRG: 806 | Disposition: A | Discharge status: Home / Self Care | Payer: Medicaid Other | Attending: Obstetrics and Gynecology | Admitting: Obstetrics and Gynecology

## 2020-12-30 ENCOUNTER — Encounter (HOSPITAL_COMMUNITY): Payer: Self-pay | Admitting: Obstetrics and Gynecology

## 2020-12-30 ENCOUNTER — Other Ambulatory Visit: Payer: Self-pay

## 2020-12-30 DIAGNOSIS — D6959 Other secondary thrombocytopenia: Secondary | ICD-10-CM | POA: Diagnosis not present

## 2020-12-30 DIAGNOSIS — O368130 Decreased fetal movements, third trimester, not applicable or unspecified: Secondary | ICD-10-CM | POA: Diagnosis not present

## 2020-12-30 DIAGNOSIS — Z3A37 37 weeks gestation of pregnancy: Secondary | ICD-10-CM

## 2020-12-30 DIAGNOSIS — E669 Obesity, unspecified: Secondary | ICD-10-CM

## 2020-12-30 DIAGNOSIS — O0990 Supervision of high risk pregnancy, unspecified, unspecified trimester: Secondary | ICD-10-CM

## 2020-12-30 DIAGNOSIS — O9902 Anemia complicating childbirth: Secondary | ICD-10-CM | POA: Diagnosis not present

## 2020-12-30 DIAGNOSIS — O99013 Anemia complicating pregnancy, third trimester: Secondary | ICD-10-CM | POA: Insufficient documentation

## 2020-12-30 DIAGNOSIS — O99820 Streptococcus B carrier state complicating pregnancy: Secondary | ICD-10-CM | POA: Diagnosis not present

## 2020-12-30 DIAGNOSIS — D5720 Sickle-cell/Hb-C disease without crisis: Secondary | ICD-10-CM | POA: Diagnosis present

## 2020-12-30 DIAGNOSIS — O99214 Obesity complicating childbirth: Secondary | ICD-10-CM | POA: Diagnosis present

## 2020-12-30 DIAGNOSIS — O99213 Obesity complicating pregnancy, third trimester: Secondary | ICD-10-CM

## 2020-12-30 DIAGNOSIS — O24410 Gestational diabetes mellitus in pregnancy, diet controlled: Secondary | ICD-10-CM | POA: Diagnosis not present

## 2020-12-30 DIAGNOSIS — O133 Gestational [pregnancy-induced] hypertension without significant proteinuria, third trimester: Secondary | ICD-10-CM | POA: Diagnosis not present

## 2020-12-30 DIAGNOSIS — O352XX0 Maternal care for (suspected) hereditary disease in fetus, not applicable or unspecified: Secondary | ICD-10-CM | POA: Diagnosis not present

## 2020-12-30 DIAGNOSIS — O139 Gestational [pregnancy-induced] hypertension without significant proteinuria, unspecified trimester: Secondary | ICD-10-CM

## 2020-12-30 DIAGNOSIS — O9912 Other diseases of the blood and blood-forming organs and certain disorders involving the immune mechanism complicating childbirth: Secondary | ICD-10-CM | POA: Diagnosis not present

## 2020-12-30 DIAGNOSIS — Z8616 Personal history of COVID-19: Secondary | ICD-10-CM

## 2020-12-30 DIAGNOSIS — O134 Gestational [pregnancy-induced] hypertension without significant proteinuria, complicating childbirth: Secondary | ICD-10-CM | POA: Diagnosis present

## 2020-12-30 DIAGNOSIS — D57219 Sickle-cell/Hb-C disease with crisis, unspecified: Secondary | ICD-10-CM | POA: Diagnosis present

## 2020-12-30 DIAGNOSIS — D649 Anemia, unspecified: Secondary | ICD-10-CM | POA: Diagnosis not present

## 2020-12-30 DIAGNOSIS — Z148 Genetic carrier of other disease: Secondary | ICD-10-CM

## 2020-12-30 DIAGNOSIS — D509 Iron deficiency anemia, unspecified: Secondary | ICD-10-CM | POA: Diagnosis not present

## 2020-12-30 DIAGNOSIS — O99824 Streptococcus B carrier state complicating childbirth: Secondary | ICD-10-CM | POA: Diagnosis not present

## 2020-12-30 DIAGNOSIS — D571 Sickle-cell disease without crisis: Secondary | ICD-10-CM | POA: Diagnosis not present

## 2020-12-30 DIAGNOSIS — Z3A38 38 weeks gestation of pregnancy: Secondary | ICD-10-CM | POA: Diagnosis not present

## 2020-12-30 DIAGNOSIS — O24420 Gestational diabetes mellitus in childbirth, diet controlled: Secondary | ICD-10-CM | POA: Diagnosis present

## 2020-12-30 DIAGNOSIS — D5700 Hb-SS disease with crisis, unspecified: Secondary | ICD-10-CM | POA: Diagnosis present

## 2020-12-30 HISTORY — DX: Gestational diabetes mellitus in pregnancy, unspecified control: O24.419

## 2020-12-30 LAB — CBC
HCT: 26.3 % — ABNORMAL LOW (ref 36.0–46.0)
Hemoglobin: 8.9 g/dL — ABNORMAL LOW (ref 12.0–15.0)
MCH: 24.8 pg — ABNORMAL LOW (ref 26.0–34.0)
MCHC: 33.8 g/dL (ref 30.0–36.0)
MCV: 73.3 fL — ABNORMAL LOW (ref 80.0–100.0)
Platelets: 176 10*3/uL (ref 150–400)
RBC: 3.59 MIL/uL — ABNORMAL LOW (ref 3.87–5.11)
RDW: 21.1 % — ABNORMAL HIGH (ref 11.5–15.5)
WBC: 7.8 10*3/uL (ref 4.0–10.5)
nRBC: 0 % (ref 0.0–0.2)

## 2020-12-30 LAB — TYPE AND SCREEN
ABO/RH(D): A POS
Antibody Screen: NEGATIVE

## 2020-12-30 MED ORDER — FENTANYL CITRATE (PF) 100 MCG/2ML IJ SOLN
50.0000 ug | Freq: Once | INTRAMUSCULAR | Status: AC
  Administered 2020-12-30: 50 ug via INTRAVENOUS
  Filled 2020-12-30: qty 2

## 2020-12-30 MED ORDER — ONDANSETRON HCL 4 MG/2ML IJ SOLN
4.0000 mg | Freq: Four times a day (QID) | INTRAMUSCULAR | Status: DC | PRN
  Administered 2020-12-31 – 2021-01-02 (×3): 4 mg via INTRAVENOUS
  Filled 2020-12-30 (×3): qty 2

## 2020-12-30 MED ORDER — MISOPROSTOL 50MCG HALF TABLET
50.0000 ug | ORAL_TABLET | ORAL | Status: DC | PRN
  Administered 2020-12-30 – 2021-01-01 (×3): 50 ug via BUCCAL
  Filled 2020-12-30 (×3): qty 1

## 2020-12-30 MED ORDER — ACETAMINOPHEN 325 MG PO TABS
650.0000 mg | ORAL_TABLET | ORAL | Status: DC | PRN
  Administered 2021-01-02: 650 mg via ORAL
  Filled 2020-12-30: qty 2

## 2020-12-30 MED ORDER — LACTATED RINGERS IV SOLN
500.0000 mL | INTRAVENOUS | Status: DC | PRN
Start: 2020-12-30 — End: 2021-01-02

## 2020-12-30 MED ORDER — SOD CITRATE-CITRIC ACID 500-334 MG/5ML PO SOLN
30.0000 mL | ORAL | Status: DC | PRN
  Administered 2020-12-30 – 2021-01-02 (×2): 30 mL via ORAL
  Filled 2020-12-30 (×2): qty 15

## 2020-12-30 MED ORDER — PENICILLIN G POT IN DEXTROSE 60000 UNIT/ML IV SOLN
3.0000 10*6.[IU] | INTRAVENOUS | Status: DC
  Administered 2020-12-31 – 2021-01-02 (×10): 3 10*6.[IU] via INTRAVENOUS
  Filled 2020-12-30 (×11): qty 50

## 2020-12-30 MED ORDER — OXYCODONE-ACETAMINOPHEN 5-325 MG PO TABS: 2.0000 | ORAL_TABLET | ORAL | Status: DC | PRN

## 2020-12-30 MED ORDER — OXYTOCIN-SODIUM CHLORIDE 30-0.9 UT/500ML-% IV SOLN
2.5000 [IU]/h | INTRAVENOUS | Status: DC
  Filled 2020-12-30: qty 500

## 2020-12-30 MED ORDER — OXYTOCIN BOLUS FROM INFUSION
333.0000 mL | Freq: Once | INTRAVENOUS | Status: AC
  Administered 2021-01-02: 333 mL via INTRAVENOUS

## 2020-12-30 MED ORDER — SODIUM CHLORIDE 0.9 % IV SOLN
5.0000 10*6.[IU] | Freq: Once | INTRAVENOUS | Status: AC
  Administered 2020-12-31: 5 10*6.[IU] via INTRAVENOUS
  Filled 2020-12-30: qty 5

## 2020-12-30 MED ORDER — LACTATED RINGERS IV SOLN: INTRAVENOUS | Status: DC

## 2020-12-30 MED ORDER — OXYCODONE-ACETAMINOPHEN 5-325 MG PO TABS: 1.0000 | ORAL_TABLET | ORAL | Status: DC | PRN

## 2020-12-30 MED ORDER — LIDOCAINE HCL (PF) 1 % IJ SOLN
30.0000 mL | INTRAMUSCULAR | Status: AC | PRN
  Administered 2021-01-02: 30 mL via SUBCUTANEOUS
  Filled 2020-12-30: qty 30

## 2020-12-30 NOTE — H&P (Addendum)
OBSTETRIC ADMISSION HISTORY AND PHYSICAL  Nicole Case is a 20 y.o. female G1P0 with IUP at 32w5dby LMP presenting for IOL for DFM. She has reported decreased fetal movement ofr past few days, seen by MFM today who recommended induction. She reports No LOF, no VB, no blurry vision, headaches or peripheral edema, and RUQ pain.  She plans on bottle feeding. She desires depo for birth control. [facility] --->  Estimated Date of Delivery: 01/15/21  Sono:    @[redacted]w[redacted]d , CWD, normal anatomy, cephalic presentation, left lat placenta, 3352g, 67% EFW   Prenatal History/Complications:  - [id number] recent diagnosis, has not been checking blood glucose regularly  - Sickle Cell Disease  - Microcytic Anemia, most recent hgb 7.8 on 12/8.  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 Anxiety   . Sickle cell anemia (HCC)     Past Surgical History: Past Surgical History:  Procedure Laterality Date  . NO PAST SURGERIES      Obstetrical History: OB History    Gravida  1   Para      Term      Preterm      AB      Living        SAB      IAB      Ectopic      Multiple      Live Births              Social History Social History   Socioeconomic History  . Marital status: Single    Spouse name: Not on file  . Number of children: Not on file  . Years of education: Not on file  . Highest education level: Not on file  Occupational History    Employer: [employer]  Tobacco Use  . Smoking status: Never Smoker  . Smokeless tobacco: Never Used  Vaping Use  . Vaping Use: Never used  Substance and Sexual Activity  . Alcohol use: No  . Drug use: Not Currently    Types: Marijuana  . Sexual activity: Yes  Other Topics Concern  . Not on file  Social History  Narrative  . Not on file   Social Determinants of Health   Financial Resource Strain: Not on file  Food Insecurity: No Food Insecurity  . Worried About RCharity fundraiserin the Last Year: Never true  . Ran Out of Food in the Last Year: Never true  Transportation Needs: Unmet Transportation Needs  . Lack of Transportation (Medical): Yes  . Lack of Transportation (Non-Medical): Yes  Physical Activity: Not on file  Stress: Not on file  Social Connections: Not on file    Family History: Family History  Problem Relation Age of Onset  . Sickle cell anemia Brother     Allergies: No Known Allergies  Medications Prior to Admission  Medication Sig Dispense Refill Last Dose  . Accu-Chek Softclix Lancets lancets Use one lancet to check blood glucose 4 times daily. 100 each 12   . aspirin 81 MG chewable tablet Chew 1 tablet (81 mg total) by mouth daily. (Patient not taking: No sig reported) 60 tablet 3   . Blood Pressure Monitoring (BLOOD PRESSURE KIT) DEVI 1 kit by Does not apply  route once a week. Check Blood Pressure regularly and record readings into the [employer] App.  Large Cuff.  DX O90.0 (Patient not taking: No sig reported) 1 each 0   . dexamethasone (DECADRON) 6 MG tablet Take 1 tablet (6 mg total) by mouth daily. (Patient not taking: No sig reported) 5 tablet 0   . Doxylamine-Pyridoxine (DICLEGIS) 10-10 MG TBEC Take 2 tablets by mouth at bedtime. (Patient not taking: No sig reported) 60 tablet 4   . glucose blood (ACCU-CHEK GUIDE) test strip Use 1 test strip to check blood glucose 4 times daily 100 each 4   . oxyCODONE (ROXICODONE) 5 MG immediate release tablet Take 1 tablet (5 mg total) by mouth every 4 (four) hours as needed for severe pain. (Patient not taking: No sig reported) 30 tablet 0   . pantoprazole (PROTONIX) 40 MG tablet Take 1 tablet (40 mg total) by mouth daily. (Patient not taking: No sig reported) 10 tablet 0   . Prenatal Vit-Fe Fumarate-FA (PRENATAL VITAMIN PO)  Take 2 tablets by mouth daily.      . Vitamin D, Ergocalciferol, (DRISDOL) 1.25 MG (50000 UNIT) CAPS capsule Take 1 capsule (50,000 Units total) by mouth every 7 (seven) days. (Patient not taking: No sig reported) 5 capsule 0      Review of Systems   All systems reviewed and negative except as stated in HPI  Blood pressure 118/69, pulse (!) 124, last menstrual period 04/10/2020, SpO2 99 %. General appearance: alert, cooperative and appears stated age Lungs: clear to auscultation bilaterally Abdomen: soft, non-tender; bowel sounds normal Pelvic: closed/thick  Extremities: Homans sign is negative, no sign of DVT Presentation: cephalic Fetal monitoring: baseline 140, mod variability, pos accels, no decels  Uterine activity irritability      Prenatal labs: ABO, Rh: --/--/A POS (11/21 0617) Antibody: NEG (11/21 0617) Rubella: 2.94 (07/27 1151) RPR: Non Reactive (12/08 1047)  HBsAg: Negative (07/27 1151)  HIV: Non Reactive (12/08 1047)  GBS: Positive/-- (01/20 0222)  2 hr Glucola abnormal  Genetic screening  Low risk NIPS, carrier for SMA, beta hemoglobinopathy  Anatomy US normal  Prenatal Transfer Tool  Maternal Diabetes: Yes:  Diabetes Type:  Diet controlled Genetic Screening: Normal aside from carrier status  Maternal Ultrasounds/Referrals: Normal Fetal Ultrasounds or other Referrals:  Referred to Materal Fetal Medicine  Maternal Substance Abuse:  No Significant Maternal Medications:  None Significant Maternal Lab Results: Group B Strep positive  No results found for this or any previous visit (from the past 24 hour(s)).  Patient Active Problem List   Diagnosis Date Noted  . GBS (group B Streptococcus carrier), +RV culture, currently pregnant 12/27/2020  . History of COVID-19 11/24/2020  . Gestational diabetes mellitus, diet-controlled 11/24/2020  . Thrombocytopenia affecting pregnancy ([place]) 10/21/2020  . Hypokalemia 10/20/2020  . Normal IUP (intrauterine pregnancy) on  prenatal ultrasound, third trimester 10/20/2020  . Pneumonia due to COVID-19 virus 10/19/2020  . 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 Carrier of genetic disorder 07/10/2020  . 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 Trichomonal vaginitis during pregnancy in first trimester 07/03/2020  . Anemia 07/03/2020  . Maternal morbid obesity, antepartum ([place]) 06/29/2020  . Supervision of high risk pregnancy, antepartum 06/28/2020  . Central chest pain   . Sickle cell anemia with crisis ([place]) 06/20/2020  . Sore throat 10/06/2019  . Fever 10/06/2019  . Urinary tract infection symptoms 10/06/2019  . Sickle-cell disease with pain ([place]) 08/01/2017  . Sickle cell  disease, type [place], with unspecified crisis ([place]) 07/31/2017  . Sickle cell anemia with pain ([place]) 05/28/2016  . 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 Sickle cell pain crisis ([place]) 01/29/2014  . Community acquired pneumonia 12/12/2011  . Sickle cell pain crisis 12/12/2011    Assessment/Plan:  Markiesha Delia Seitzinger is a 20 y.o. G1P0 at 32w5dhere for IOL for DFM.   #Induction of Labor: initial exam closed/thick, cephalic by [place] Start with cytotec and insert FB when able.  #A1GDM: CBG q4hr  #Microcytic anemia: admission hgb pending, will consider IV venofer in labor   #Pain: Epidural, pain meds prn  #FWB: Cat I  #ID:  gbs Pos, start PCN  #MOF: bottle #MOC:depo #Circ:  Yes   JJanet Berlin MD  12/30/2020, 8:54 PM

## 2020-12-30 NOTE — MAU Note (Signed)
1st call, pt not in lobby

## 2020-12-30 NOTE — MAU Provider Note (Signed)
Event Date/Time   First Provider Initiated Contact with Patient 12/30/20 1954     S Ms. Laresa Judie Petit Guerin is a 20 y.o. G1P0 pregnant female [redacted]w[redacted]d who presents to MAU today with complaint of decreased fetal movement. She was sent by MFM after her BPP (8/8) for extended monitoring because she continued to report DFM. She has A1GDM (but did not begin testing until 3 days ago and has not been testing regularly per Dr. Judeth Cornfield). Dr. Judeth Cornfield recommended IOL at 38-39 weeks depending on if pt continued to report DFM in MAU. Denies contractions, vaginal bleeding/discharge, headache, nausea/vomiting, fever, falls or recent illness.    O BP 118/69 (BP Location: Right Arm)   Pulse (!) 124   LMP 04/10/2020 (Exact Date)   SpO2 99%  Physical Exam Vitals and nursing note reviewed.  Constitutional:      General: She is not in acute distress. HENT:     Mouth/Throat:     Mouth: Mucous membranes are moist.  Eyes:     Pupils: Pupils are equal, round, and reactive to light.  Cardiovascular:     Rate and Rhythm: Tachycardia present.     Pulses: Normal pulses.  Pulmonary:     Effort: Pulmonary effort is normal.  Abdominal:     General: Bowel sounds are normal.     Palpations: Abdomen is soft.     Comments: Gravid   Musculoskeletal:        General: Normal range of motion.     Cervical back: Normal range of motion.  Skin:    General: Skin is warm and dry.     Capillary Refill: Capillary refill takes less than 2 seconds.  Neurological:     Mental Status: She is alert and oriented to person, place, and time.  Psychiatric:        Mood and Affect: Mood normal.        Behavior: Behavior normal.        Thought Content: Thought content normal.        Judgment: Judgment normal.    Fetal Tracing: reactive Baseline: 140 with multiple prolonged accels Variability: moderate Accelerations: present Decelerations: occasional variables Toco: UI  Pt continued to feel decreased fetal movement - she noted  feeling baby move 3x since she got to MAU.  Reviewed presentation, NST and MFM notes with Dr. Vergie Living. Per Dr. Zannie Kehr note, pt should be induced if DFM continues. Report called to L&D team, pt informed.  A IOL at [redacted]w[redacted]d for DFM  P Admit to L&D  Bernerd Limbo, CNM 12/30/2020 8:55 PM

## 2020-12-30 NOTE — MAU Note (Signed)
Has noticed decreased FM for last 4 days. Had u/s today and told has decreased fld and states was told to come over for fetal monitoring. Denies VB or LOF.

## 2020-12-30 NOTE — Progress Notes (Signed)
Pt c/o R,L chest pain at this time which radiates to mid back. Pt states this came on sudden, and is a sharp constant pain. On assessment VSS. Position changed for comfort. Baby remains reactive on monitor. MD notified of findings.

## 2020-12-31 ENCOUNTER — Inpatient Hospital Stay (HOSPITAL_COMMUNITY): Payer: Medicaid Other | Admitting: Anesthesiology

## 2020-12-31 ENCOUNTER — Encounter (HOSPITAL_COMMUNITY): Payer: Self-pay | Admitting: Family Medicine

## 2020-12-31 DIAGNOSIS — O24429 Gestational diabetes mellitus in childbirth, unspecified control: Secondary | ICD-10-CM | POA: Diagnosis not present

## 2020-12-31 DIAGNOSIS — O24410 Gestational diabetes mellitus in pregnancy, diet controlled: Secondary | ICD-10-CM | POA: Diagnosis not present

## 2020-12-31 DIAGNOSIS — Z3A37 37 weeks gestation of pregnancy: Secondary | ICD-10-CM | POA: Diagnosis not present

## 2020-12-31 DIAGNOSIS — O368130 Decreased fetal movements, third trimester, not applicable or unspecified: Secondary | ICD-10-CM | POA: Diagnosis not present

## 2020-12-31 DIAGNOSIS — Z3A38 38 weeks gestation of pregnancy: Secondary | ICD-10-CM | POA: Diagnosis not present

## 2020-12-31 DIAGNOSIS — O99820 Streptococcus B carrier state complicating pregnancy: Secondary | ICD-10-CM | POA: Diagnosis not present

## 2020-12-31 LAB — CBC
HCT: 25.5 % — ABNORMAL LOW (ref 36.0–46.0)
Hemoglobin: 9.1 g/dL — ABNORMAL LOW (ref 12.0–15.0)
MCH: 25.9 pg — ABNORMAL LOW (ref 26.0–34.0)
MCHC: 35.7 g/dL (ref 30.0–36.0)
MCV: 72.4 fL — ABNORMAL LOW (ref 80.0–100.0)
Platelets: 153 10*3/uL (ref 150–400)
RBC: 3.52 MIL/uL — ABNORMAL LOW (ref 3.87–5.11)
RDW: 21.1 % — ABNORMAL HIGH (ref 11.5–15.5)
WBC: 8.6 10*3/uL (ref 4.0–10.5)
nRBC: 0 % (ref 0.0–0.2)

## 2020-12-31 LAB — GLUCOSE, CAPILLARY
Glucose-Capillary: 149 mg/dL — ABNORMAL HIGH (ref 70–99)
Glucose-Capillary: 64 mg/dL — ABNORMAL LOW (ref 70–99)
Glucose-Capillary: 76 mg/dL (ref 70–99)
Glucose-Capillary: 87 mg/dL (ref 70–99)
Glucose-Capillary: 87 mg/dL (ref 70–99)
Glucose-Capillary: 90 mg/dL (ref 70–99)

## 2020-12-31 LAB — RPR: RPR Ser Ql: NONREACTIVE

## 2020-12-31 MED ORDER — LACTATED RINGERS IV SOLN
500.0000 mL | Freq: Once | INTRAVENOUS | Status: AC
  Administered 2020-12-31: 500 mL via INTRAVENOUS

## 2020-12-31 MED ORDER — EPHEDRINE 5 MG/ML INJ: 10.0000 mg | INTRAVENOUS | Status: DC | PRN

## 2020-12-31 MED ORDER — MISOPROSTOL 50MCG HALF TABLET
50.0000 ug | ORAL_TABLET | Freq: Once | ORAL | Status: AC
  Administered 2020-12-31: 50 ug via BUCCAL
  Filled 2020-12-31: qty 1

## 2020-12-31 MED ORDER — FENTANYL-BUPIVACAINE-NACL 0.5-0.125-0.9 MG/250ML-% EP SOLN
12.0000 mL/h | EPIDURAL | Status: DC | PRN
  Filled 2020-12-31 (×3): qty 250

## 2020-12-31 MED ORDER — FENTANYL-BUPIVACAINE-NACL 0.5-0.125-0.9 MG/250ML-% EP SOLN
EPIDURAL | Status: DC | PRN
  Administered 2020-12-31: 12 mL/h via EPIDURAL
  Administered 2021-01-01: 500 ug via EPIDURAL

## 2020-12-31 MED ORDER — LIDOCAINE HCL (PF) 1 % IJ SOLN
INTRAMUSCULAR | Status: DC | PRN
  Administered 2020-12-31: 2 mL via EPIDURAL
  Administered 2020-12-31: 10 mL via EPIDURAL

## 2020-12-31 MED ORDER — DIPHENHYDRAMINE HCL 50 MG/ML IJ SOLN: 12.5000 mg | INTRAMUSCULAR | Status: DC | PRN

## 2020-12-31 MED ORDER — PHENYLEPHRINE 40 MCG/ML (10ML) SYRINGE FOR IV PUSH (FOR BLOOD PRESSURE SUPPORT)
80.0000 ug | PREFILLED_SYRINGE | INTRAVENOUS | Status: DC | PRN
  Filled 2020-12-31: qty 10

## 2020-12-31 MED ORDER — MISOPROSTOL 25 MCG QUARTER TABLET
ORAL_TABLET | ORAL | Status: AC
  Filled 2020-12-31: qty 1

## 2020-12-31 MED ORDER — PROMETHAZINE HCL 25 MG/ML IJ SOLN
25.0000 mg | Freq: Once | INTRAMUSCULAR | Status: AC
  Administered 2020-12-31: 25 mg via INTRAVENOUS
  Filled 2020-12-31: qty 1

## 2020-12-31 MED ORDER — MISOPROSTOL 25 MCG QUARTER TABLET
25.0000 ug | ORAL_TABLET | Freq: Once | ORAL | Status: AC
  Administered 2020-12-31: 25 ug via VAGINAL

## 2020-12-31 MED ORDER — OXYTOCIN-SODIUM CHLORIDE 30-0.9 UT/500ML-% IV SOLN
1.0000 m[IU]/min | INTRAVENOUS | Status: DC
  Administered 2020-12-31: 2 m[IU]/min via INTRAVENOUS

## 2020-12-31 MED ORDER — PHENYLEPHRINE 40 MCG/ML (10ML) SYRINGE FOR IV PUSH (FOR BLOOD PRESSURE SUPPORT): 80.0000 ug | PREFILLED_SYRINGE | INTRAVENOUS | Status: DC | PRN

## 2020-12-31 MED ORDER — FENTANYL CITRATE (PF) 100 MCG/2ML IJ SOLN
50.0000 ug | INTRAMUSCULAR | Status: DC | PRN
  Administered 2020-12-31 (×3): 100 ug via INTRAVENOUS
  Filled 2020-12-31 (×4): qty 2

## 2020-12-31 NOTE — Progress Notes (Signed)
Labor Progress Note Nicole Case is a 20 y.o. G1P0 at [redacted]w[redacted]d presented for IOL for DFW.   S: Less pain in legs s/p epidural. Vomiting and asking for additional medication for nausea/vomiting.   O:  BP (!) 139/58   Pulse (!) 117   Temp 98.2 F (36.8 C) (Axillary)   Resp 16   Ht 5\' 7"  (1.702 m)   Wt 124.7 kg   LMP 04/10/2020 (Exact Date)   SpO2 98%   BMI 43.07 kg/m  EFM: 125 bpm baseline / moderate variability / + accelerations / no decelerations  Toco 1 Q 90 seconds   CVE: Dilation: Fingertip Effacement (%): 50 Cervical Position: Anterior Station: Ballotable Presentation: Vertex Exam by:: Dr. [email or web address]   A&P: 20 y.o. G1P0 [redacted]w[redacted]d here for IOL for DFM, A1GDM #Labor: Progressing well. Will start pitocin.  #Pain: pain meds, epidural in place  #FWB: Category I #GBS positive will start PCN when cervix >3cm #A1GDM: CBG @ 0741 87, continue checks Q4H #N/V ordered one dose phenergan 25mg   [redacted]w[redacted]d, Student-PA 11:37 AM

## 2020-12-31 NOTE — Progress Notes (Signed)
Labor Progress Note Nicole Case is a 20 y.o. G1P0 at [redacted]w[redacted]d presented for IOL for DFM  S: got a nap in   O:  BP 125/75   Pulse (!) 101   Temp 98.5 F (36.9 C) (Oral)   Resp 16   Ht 5\' 7"  (1.702 m)   Wt 124.7 kg   LMP 04/10/2020 (Exact Date)   SpO2 99%   BMI 43.07 kg/m  EFM: baseline 145/mod variability/pos accels/no decels   CVE: Dilation: Fingertip Effacement (%): 50 Cervical Position: Anterior Station: Ballotable Presentation: Vertex Exam by:: [email or web address], MD   A&P: 20 y.o. G1P0 [redacted]w[redacted]d here for IOL for DFM, A1GDM #Labor: s/p cytotec x2. FB placed at this time. Continue w cytotec and reassess in four hours.   #A1GDM: initial CBG 149, on repeat 2 hours later 87. Continue q4 CBG at this time.   #Pain: pain meds, epidural prn  #FWB: cat I  #GBS positive, will start PCN when cervix >3cm     [redacted]w[redacted]d, MD 7:42 AM

## 2020-12-31 NOTE — Anesthesia Preprocedure Evaluation (Signed)
Anesthesia Evaluation  Patient identified by MRN, date of birth, ID band Patient awake    Reviewed: Allergy & Precautions, Patient's Chart, lab work & pertinent test results  Airway Mallampati: III  TM Distance: >3 FB Neck ROM: Full    Dental no notable dental hx.    Pulmonary  COVID in November 2021, had sickle crisis at that time and more frequently since then Episode of CP yesterday w/ negative cardiac W/U by [facility] team; being attributed to sickle cell   Pulmonary exam normal breath sounds clear to auscultation       Cardiovascular negative cardio ROS Normal cardiovascular exam Rhythm:Regular Rate:Normal     Neuro/Psych PSYCHIATRIC DISORDERS Anxiety negative neurological ROS     GI/Hepatic Neg liver ROS, GERD  Medicated and Controlled,  Endo/Other  diabetes, Well Controlled, GestationalMorbid obesityBMI 43  Renal/GU negative Renal ROS  negative genitourinary   Musculoskeletal negative musculoskeletal ROS (+)   Abdominal (+) + obese,   Peds negative pediatric ROS (+)  Hematology  (+) Blood dyscrasia, Sickle cell anemia and anemia , H/H 9.1/25.5, plt 153   Anesthesia Other Findings   Reproductive/Obstetrics (+) Pregnancy                             Anesthesia Physical Anesthesia Plan  ASA: III and emergent  Anesthesia Plan: Epidural   Post-op Pain Management:    Induction:   PONV Risk Score and Plan: 2  Airway Management Planned: Natural Airway  Additional Equipment: None  Intra-op Plan:   Post-operative Plan:   Informed Consent: I have reviewed the patients History and Physical, chart, labs and discussed the procedure including the risks, benefits and alternatives for the proposed anesthesia with the patient or authorized representative who has indicated his/her understanding and acceptance.       Plan Discussed with:   Anesthesia Plan Comments:          Anesthesia Quick Evaluation

## 2020-12-31 NOTE — Progress Notes (Signed)
LABOR PROGRESS NOTE  Nicole Case is a 20 y.o. G1P0 at [redacted]w[redacted]d admitted for IOL for DFM.   Subjective: Sleepy, dejected re long induction process   Objective: BP (!) 107/51 (BP Location: Right Arm)   Pulse 83   Temp 98.9 F (37.2 C) (Oral)   Resp 18   Ht 5\' 7"  (1.702 m)   Wt 124.7 kg   LMP 04/10/2020 (Exact Date)   SpO2 98%   BMI 43.07 kg/m  or  Vitals:   12/31/20 1930 12/31/20 2000 12/31/20 2030 12/31/20 2100  BP: 120/71 118/67 119/61 (!) 107/51  Pulse: 92 (!) 102 90 83  Resp:  18    Temp:  98.9 F (37.2 C)    TempSrc:  Oral    SpO2:      Weight:      Height:        Dilation: 2.5 Effacement (%): 50 Cervical Position: Anterior Station: Ballotable Presentation: Vertex Exam by:: Dr. [email or web address] FHT: baseline rate 145, moderate variability/pos accels/no decels  Toco: 2-5  Labs: Lab Results  Component Value Date   WBC 8.6 12/31/2020   HGB 9.1 (L) 12/31/2020   HCT 25.5 (L) 12/31/2020   MCV 72.4 (L) 12/31/2020   PLT 153 12/31/2020    Patient Active Problem List   Diagnosis Date Noted  . Decreased fetal movement during pregnancy in third trimester, antepartum 12/31/2020  . GBS (group B Streptococcus carrier), +RV culture, currently pregnant 12/27/2020  . History of COVID-19 11/24/2020  . Gestational diabetes mellitus, diet-controlled 11/24/2020  . Thrombocytopenia affecting pregnancy (HCC) 10/21/2020  . Hypokalemia 10/20/2020  . Normal IUP (intrauterine pregnancy) on prenatal ultrasound, third trimester 10/20/2020  . Pneumonia due to COVID-19 virus 10/19/2020  . 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 Carrier of genetic disorder 07/10/2020  . 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 Trichomonal vaginitis during pregnancy in first trimester 07/03/2020  . Anemia 07/03/2020  . Maternal morbid obesity, antepartum (HCC) 06/29/2020  . Supervision of high risk pregnancy, antepartum 06/28/2020  . Central chest pain   . Sickle cell  anemia with crisis (HCC) 06/20/2020  . Sore throat 10/06/2019  . Fever 10/06/2019  . Urinary tract infection symptoms 10/06/2019  . Sickle-cell disease with pain (HCC) 08/01/2017  . Sickle cell disease, type [place], with unspecified crisis (HCC) 07/31/2017  . Sickle cell anemia with pain (HCC) 05/28/2016  . 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 Sickle cell pain crisis (HCC) 01/29/2014  . Community acquired pneumonia 12/12/2011  . Sickle cell pain crisis 12/12/2011    Assessment / Plan: 20 y.o. G1P0 at [redacted]w[redacted]d here for IOL for DFM. A1GDM   Labor: s/p FB, cyto x3. Transitioned to pitocin at 1140. FB out around 1900, just rechecked and is 2.5/50/-2. Will stop pitocin and give additional dose of cytotec at this time.  #A1GDM:  q4 CBG, most recent 76>64>90   Fetal Wellbeing:  Cat I  Pain Control:  Epidural  Anticipated MOD:  SVD  03-02-2001, MD 12/31/2020, 9:46 PM

## 2020-12-31 NOTE — Anesthesia Procedure Notes (Signed)
Epidural Patient location during procedure: OB Start time: 12/31/2020 10:12 AM End time: 12/31/2020 10:21 AM  Staffing Anesthesiologist: Lannie Fields, DO Performed: anesthesiologist   Preanesthetic Checklist Completed: patient identified, IV checked, risks and benefits discussed, monitors and equipment checked, pre-op evaluation and timeout performed  Epidural Patient position: sitting Prep: DuraPrep and site prepped and draped Patient monitoring: continuous pulse ox, blood pressure, heart rate and cardiac monitor Approach: midline Location: L3-L4 Injection technique: LOR air  Needle:  Needle type: Tuohy  Needle gauge: 17 G Needle length: 9 cm Needle insertion depth: 8 cm Catheter type: closed end flexible Catheter size: 19 Gauge Catheter at skin depth: 15 cm Test dose: negative  Assessment Sensory level: T8 Events: blood not aspirated, injection not painful, no injection resistance, no paresthesia and negative IV test  Additional Notes Patient identified. Risks/Benefits/Options discussed with patient including but not limited to bleeding, infection, nerve damage, paralysis, failed block, incomplete pain control, headache, blood pressure changes, nausea, vomiting, reactions to medication both or allergic, itching and postpartum back pain. Confirmed with bedside nurse the patient's most recent platelet count. Confirmed with patient that they are not currently taking any anticoagulation, have any bleeding history or any family history of bleeding disorders. Patient expressed understanding and wished to proceed. All questions were answered. Sterile technique was used throughout the entire procedure. Please see nursing notes for vital signs. Test dose was given through epidural catheter and negative prior to continuing to dose epidural or start infusion. Warning signs of high block given to the patient including shortness of breath, tingling/numbness in hands, complete motor  block, or any concerning symptoms with instructions to call for help. Patient was given instructions on fall risk and not to get out of bed. All questions and concerns addressed with instructions to call with any issues or inadequate analgesia.  Reason for block:procedure for pain

## 2020-12-31 NOTE — Progress Notes (Signed)
LABOR PROGRESS NOTE  Nicole Case is a 20 y.o. G1P0 at [redacted]w[redacted]d admitted for IOL for DFM.   Subjective: Patient sleep on right side, feels better after nausea medication   Objective: BP 133/81   Pulse 87   Temp 98.2 F (36.8 C) (Axillary)   Resp 18   Ht 5\' 7"  (1.702 m)   Wt 124.7 kg   LMP 04/10/2020 (Exact Date)   SpO2 98%   BMI 43.07 kg/m  or  Vitals:   12/31/20 1050 12/31/20 1100 12/31/20 1200 12/31/20 1230  BP: 119/71 (!) 139/58 129/83 133/81  Pulse: 100 (!) 117 87 87  Resp: 16 16 16 18   Temp: 98.2 F (36.8 C)     TempSrc: Axillary     SpO2: 98%     Weight:      Height:        FB continues to be in place- FB placed @0740   Dilation: Fingertip Effacement (%): 50 Cervical Position: Anterior Station: Ballotable Presentation: Vertex Exam by:: CNM FHT: baseline rate 130, moderate/minimal varibility, +accel, 1 late decel@1236  due to uterine tachysystole. No decelerations after repositioning and bolus  Toco: 2-5  Labs: Lab Results  Component Value Date   WBC 8.6 12/31/2020   HGB 9.1 (L) 12/31/2020   HCT 25.5 (L) 12/31/2020   MCV 72.4 (L) 12/31/2020   PLT 153 12/31/2020    Patient Active Problem List   Diagnosis Date Noted  . Decreased fetal movement during pregnancy in third trimester, antepartum 12/31/2020  . GBS (group B Streptococcus carrier), +RV culture, currently pregnant 12/27/2020  . History of COVID-19 11/24/2020  . Gestational diabetes mellitus, diet-controlled 11/24/2020  . Thrombocytopenia affecting pregnancy (HCC) 10/21/2020  . Hypokalemia 10/20/2020  . Normal IUP (intrauterine pregnancy) on prenatal ultrasound, third trimester 10/20/2020  . Pneumonia due to COVID-19 virus 10/19/2020  . 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 Carrier of genetic disorder 07/10/2020  . 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 Trichomonal vaginitis during pregnancy in first trimester 07/03/2020  . Anemia 07/03/2020  .  Maternal morbid obesity, antepartum (HCC) 06/29/2020  . Supervision of high risk pregnancy, antepartum 06/28/2020  . Central chest pain   . Sickle cell anemia with crisis (HCC) 06/20/2020  . Sore throat 10/06/2019  . Fever 10/06/2019  . Urinary tract infection symptoms 10/06/2019  . Sickle-cell disease with pain (HCC) 08/01/2017  . Sickle cell disease, type [place], with unspecified crisis (HCC) 07/31/2017  . Sickle cell anemia with pain (HCC) 05/28/2016  . 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 Sickle cell pain crisis (HCC) 01/29/2014  . Community acquired pneumonia 12/12/2011  . Sickle cell pain crisis 12/12/2011    Assessment / Plan: 20 y.o. G1P0 at [redacted]w[redacted]d here for IOL for DFM. A1GDM   Labor: FB in place, currently on 32milli-unit/min of pitocin. Not to go past 6 milli-unit/min while FB in place. FB pulled with slight movement but not released - traction applied.  Fetal Wellbeing:  Cat II   Pain Control:  Epidural  Anticipated MOD:  SVD  36, CNM 12/31/2020, 1:37 PM

## 2020-12-31 NOTE — Progress Notes (Signed)
Labor Progress Note Nicole Case is a 20 y.o. G1P0 at 101w6d presented for IOL for DFM S: Not feeling anything   O:  BP 127/83   Pulse (!) 107   Temp 98.6 F (37 C) (Oral)   Resp 16   Ht 5\' 7"  (1.702 m)   Wt 124.7 kg   LMP 04/10/2020 (Exact Date)   SpO2 99%   BMI 43.07 kg/m  EFM: baseline 145/mod variability/pos accels/no decels   CVE: Dilation: Fingertip Effacement (%): 50 Cervical Position: Anterior Station: Ballotable Presentation: Vertex Exam by:: [email or web address], MD   A&P: 20 y.o. G1P0 [redacted]w[redacted]d here for IOL for DFM, A1GDM #Labor: s/p cytotec. Attempted FB but unable to get through internal os. Will reattempt later, continue with additional cytotec #A1GDM: initial CBG 149, on repeat 2 hours later 87. Continue q4 CBG at this time.   #Pain: pain meds, epidural prn  #FWB: cat I  #GBS positive, will start PCN when cervix >3cm     [redacted]w[redacted]d, MD 3:16 AM

## 2021-01-01 LAB — PROTEIN / CREATININE RATIO, URINE
Creatinine, Urine: 85.39 mg/dL
Protein Creatinine Ratio: 0.2 mg/mg{Cre} — ABNORMAL HIGH (ref 0.00–0.15)
Total Protein, Urine: 17 mg/dL

## 2021-01-01 LAB — COMPREHENSIVE METABOLIC PANEL
ALT: 10 U/L (ref 0–44)
AST: 15 U/L (ref 15–41)
Albumin: 2.6 g/dL — ABNORMAL LOW (ref 3.5–5.0)
Alkaline Phosphatase: 129 U/L — ABNORMAL HIGH (ref 38–126)
Anion gap: 8 (ref 5–15)
BUN: 5 mg/dL — ABNORMAL LOW (ref 6–20)
CO2: 23 mmol/L (ref 22–32)
Calcium: 8.8 mg/dL — ABNORMAL LOW (ref 8.9–10.3)
Chloride: 105 mmol/L (ref 98–111)
Creatinine, Ser: 0.74 mg/dL (ref 0.44–1.00)
GFR, Estimated: >60 mL/min (ref >60)
Glucose, Bld: 111 mg/dL — ABNORMAL HIGH (ref 70–99)
Potassium: 4.3 mmol/L (ref 3.5–5.1)
Sodium: 136 mmol/L (ref 135–145)
Total Bilirubin: 5.4 mg/dL — ABNORMAL HIGH (ref 0.3–1.2)
Total Protein: 6.4 g/dL — ABNORMAL LOW (ref 6.5–8.1)

## 2021-01-01 LAB — GLUCOSE, CAPILLARY
Glucose-Capillary: 110 mg/dL — ABNORMAL HIGH (ref 70–99)
Glucose-Capillary: 163 mg/dL — ABNORMAL HIGH (ref 70–99)
Glucose-Capillary: 72 mg/dL (ref 70–99)
Glucose-Capillary: 90 mg/dL (ref 70–99)
Glucose-Capillary: 91 mg/dL (ref 70–99)
Glucose-Capillary: 92 mg/dL (ref 70–99)
Glucose-Capillary: 93 mg/dL (ref 70–99)
Glucose-Capillary: 94 mg/dL (ref 70–99)

## 2021-01-01 LAB — CBC
HCT: 28.2 % — ABNORMAL LOW (ref 36.0–46.0)
Hemoglobin: 9.5 g/dL — ABNORMAL LOW (ref 12.0–15.0)
MCH: 24.9 pg — ABNORMAL LOW (ref 26.0–34.0)
MCHC: 33.7 g/dL (ref 30.0–36.0)
MCV: 73.8 fL — ABNORMAL LOW (ref 80.0–100.0)
Platelets: 154 10*3/uL (ref 150–400)
RBC: 3.82 MIL/uL — ABNORMAL LOW (ref 3.87–5.11)
RDW: 21 % — ABNORMAL HIGH (ref 11.5–15.5)
WBC: 10.3 10*3/uL (ref 4.0–10.5)
nRBC: 0.2 % (ref 0.0–0.2)

## 2021-01-01 MED ORDER — MISOPROSTOL 50MCG HALF TABLET
50.0000 ug | ORAL_TABLET | Freq: Once | ORAL | Status: AC
  Administered 2021-01-01: 50 ug via BUCCAL
  Filled 2021-01-01: qty 1

## 2021-01-01 MED ORDER — OXYTOCIN-SODIUM CHLORIDE 30-0.9 UT/500ML-% IV SOLN
1.0000 m[IU]/min | INTRAVENOUS | Status: DC
  Administered 2021-01-01: 2 m[IU]/min via INTRAVENOUS
  Filled 2021-01-01: qty 500

## 2021-01-01 NOTE — Progress Notes (Signed)
LABOR PROGRESS NOTE  Nicole Case is a 20 y.o. G1P0 at [redacted]w[redacted]d admitted for IOL for DFM.   Subjective: Pt resting comfortably in bed. No concerns at this time.  Objective: BP 139/89   Pulse (!) 129   Temp 98.3 F (36.8 C) (Oral)   Resp 18   Ht 5\' 7"  (1.702 m)   Wt 124.7 kg   LMP 04/10/2020 (Exact Date)   SpO2 100%   BMI 43.07 kg/m  or  Vitals:   01/01/21 1130 01/01/21 1200 01/01/21 1230 01/01/21 1300  BP: 131/72 135/78 (!) 142/90 139/89  Pulse: 94 92 (!) 115 (!) 129  Resp: 16 18 18 18   Temp:      TempSrc:      SpO2:      Weight:      Height:        Dilation: 4 Effacement (%): 50 Cervical Position: Anterior Station: -1 Presentation: Vertex Exam by:: Dr. FHT: baseline rate 135, moderate variability/multiple accels/no decels  Toco: irregular, q1-2 min   Labs: Lab Results  Component Value Date   WBC 10.3 01/01/2021   HGB 9.5 (L) 01/01/2021   HCT 28.2 (L) 01/01/2021   MCV 73.8 (L) 01/01/2021   PLT 154 01/01/2021    Patient Active Problem List   Diagnosis Date Noted  . Decreased fetal movement during pregnancy in third trimester, antepartum 12/31/2020  . GBS (group B Streptococcus carrier), +RV culture, currently pregnant 12/27/2020  . History of COVID-19 11/24/2020  . Gestational diabetes mellitus, diet-controlled 11/24/2020  . Thrombocytopenia affecting pregnancy (HCC) 10/21/2020  . Hypokalemia 10/20/2020  . Normal IUP (intrauterine pregnancy) on prenatal ultrasound, third trimester 10/20/2020  . Pneumonia due to COVID-19 virus 10/19/2020  . 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 Carrier of genetic disorder 07/10/2020  . 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 Trichomonal vaginitis during pregnancy in first trimester 07/03/2020  . Anemia 07/03/2020  . Maternal morbid obesity, antepartum (HCC) 06/29/2020  . Supervision of high risk pregnancy, antepartum 06/28/2020  . Central chest pain   . Sickle cell anemia with  crisis (HCC) 06/20/2020  . Sore throat 10/06/2019  . Fever 10/06/2019  . Urinary tract infection symptoms 10/06/2019  . Sickle-cell disease with pain (HCC) 08/01/2017  . Sickle cell disease, type [place], with unspecified crisis (HCC) 07/31/2017  . Sickle cell anemia with pain (HCC) 05/28/2016  . 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 Sickle cell pain crisis (HCC) 01/29/2014  . Community acquired pneumonia 12/12/2011  . Sickle cell pain crisis 12/12/2011    Assessment / Plan: 20 y.o. G1P0 at [redacted]w[redacted]d here for IOL for DFM in the setting of A1GDM.  Labor: s/p FB, cyto x3. Transitioned to pitocin at 1140 on 1/28. FB out around 1900 on 1/28, stopped pitocin at 2100. Overnight pt received cyto x3. Placed Cook's catheter at 0945, now s/p expulsion. Pitocin restarted at 1145. Now s/p AROM for clear fluid at 1310. IUPC also placed to facilitate titration of pitocin.  #A1GDM:  q4 CBG, most recent 90>93>72 >91 >111 >163. #Elevated Bps intrapartum: intermittent mild range Bps. Asymptomatic. Preeclampsia labs unremarkable. UP:C 0.20.  Fetal Wellbeing:  Cat I strip Pain Control:  Epidural   2/28, MD 01/01/2021, 1:14 PM

## 2021-01-01 NOTE — Progress Notes (Signed)
LABOR PROGRESS NOTE  Nicole Case is a 20 y.o. G1P0 at 104w6d admitted for IOL for DFM.   Subjective: Pt endorsing fatigue but otherwise no concerns at this time.  Objective: BP 139/69   Pulse 96   Temp 98.4 F (36.9 C)   Resp 16   Ht 5\' 7"  (1.702 m)   Wt 124.7 kg   LMP 04/10/2020 (Exact Date)   SpO2 100%   BMI 43.07 kg/m  or  Vitals:   01/01/21 1900 01/01/21 1930 01/01/21 2000 01/01/21 2032  BP: 119/73 (!) 124/95 (!) 140/92 139/69  Pulse: (!) 102 (!) 112 (!) 114 96  Resp: 16     Temp:    98.4 F (36.9 C)  TempSrc:      SpO2:      Weight:      Height:        Dilation: 4.5 Effacement (%): 80 Cervical Position: Anterior Station: 0 Presentation: Vertex Exam by:: Dr [email or web address] FHT: baseline rate 145, minimal-moderate variability/no accels/no decels  Toco: q1-2 min   Labs: Lab Results  Component Value Date   WBC 10.3 01/01/2021   HGB 9.5 (L) 01/01/2021   HCT 28.2 (L) 01/01/2021   MCV 73.8 (L) 01/01/2021   PLT 154 01/01/2021    Patient Active Problem List   Diagnosis Date Noted  . Decreased fetal movement during pregnancy in third trimester, antepartum 12/31/2020  . GBS (group B Streptococcus carrier), +RV culture, currently pregnant 12/27/2020  . History of COVID-19 11/24/2020  . Gestational diabetes mellitus, diet-controlled 11/24/2020  . Thrombocytopenia affecting pregnancy (HCC) 10/21/2020  . Hypokalemia 10/20/2020  . Normal IUP (intrauterine pregnancy) on prenatal ultrasound, third trimester 10/20/2020  . Pneumonia due to COVID-19 virus 10/19/2020  . 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 Carrier of genetic disorder 07/10/2020  . 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 Trichomonal vaginitis during pregnancy in first trimester 07/03/2020  . Anemia 07/03/2020  . Maternal morbid obesity, antepartum (HCC) 06/29/2020  . Supervision of high risk pregnancy, antepartum 06/28/2020  . Central chest pain   . Sickle cell anemia  with crisis (HCC) 06/20/2020  . Sore throat 10/06/2019  . Fever 10/06/2019  . Urinary tract infection symptoms 10/06/2019  . Sickle-cell disease with pain (HCC) 08/01/2017  . Sickle cell disease, type [place], with unspecified crisis (HCC) 07/31/2017  . Sickle cell anemia with pain (HCC) 05/28/2016  . 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 Sickle cell pain crisis (HCC) 01/29/2014  . Community acquired pneumonia 12/12/2011  . Sickle cell pain crisis 12/12/2011    Assessment / Plan: 20 y.o. G1P0 at [redacted]w[redacted]d here for IOL for DFM in the setting of A1GDM.  Labor: s/p FB, cyto x3. Transitioned to pitocin at 1140 on 1/28. FB out around 1900 on 1/28, stopped pitocin at 2100. Overnight pt received cyto x3. Placed Cook's catheter at 0945, now s/p expulsion. Pitocin restarted at 1145. Now s/p AROM for clear fluid at 1310. Patient had significant advancement in fetal station from last exam. Contractions continue to be inadequate despite efforts to up-titrate pitocin (limited by uterine tachysystole). On bedside ultrasound, baby appears ROT. Will attempt maternal reposition and give a pitocin break for four hours due to tachysystole. After pit break, plan to start again and titrate as able.  #FWB: Category 2 strip given intermittent periods of minimal variability. With pitocin break, improved variability. Will continue to monitor.  #A1GDM:  q4 CBG, most recent 90>93>72 >91 >111 >163 >92 >90.  #  Elevated Bps intrapartum: intermittent mild range Bps. Asymptomatic. Preeclampsia labs unremarkable except for elevated bilirubin of 5.4. UP:C 0.20  #Sickle Cell Disease: repeat CBC today stable from time of admission. Pain well controlled. Decreased IVF rate to 67ml/hr given prolonged duration of labor.  Pain Control:  Epidural   Shirlean Mylar, MD 01/01/2021, 9:16 PM

## 2021-01-01 NOTE — Progress Notes (Signed)
LABOR PROGRESS NOTE  Nicole Case is a 20 y.o. G1P0 at [redacted]w[redacted]d admitted for IOL for DFM.   Subjective: Pt endorsing fatigue but otherwise no concerns at this time.  Objective: BP 134/90   Pulse (!) 106   Temp 98.8 F (37.1 C) (Oral)   Resp 16   Ht 5\' 7"  (1.702 m)   Wt 124.7 kg   LMP 04/10/2020 (Exact Date)   SpO2 100%   BMI 43.07 kg/m  or  Vitals:   01/01/21 1600 01/01/21 1630 01/01/21 1700 01/01/21 1730  BP: 126/82 132/89 125/72 134/90  Pulse: 96 98 90 (!) 106  Resp: 18 18 16 16   Temp:      TempSrc:      SpO2:      Weight:      Height:        Dilation: 4 Effacement (%): 70 Cervical Position: Anterior Station: -1,0 Presentation: Vertex Exam by:: RN FHT: baseline rate 145, minimal-moderate variability/no accels/no decels  Toco: q1-2 min   Labs: Lab Results  Component Value Date   WBC 10.3 01/01/2021   HGB 9.5 (L) 01/01/2021   HCT 28.2 (L) 01/01/2021   MCV 73.8 (L) 01/01/2021   PLT 154 01/01/2021    Patient Active Problem List   Diagnosis Date Noted  . Decreased fetal movement during pregnancy in third trimester, antepartum 12/31/2020  . GBS (group B Streptococcus carrier), +RV culture, currently pregnant 12/27/2020  . History of COVID-19 11/24/2020  . Gestational diabetes mellitus, diet-controlled 11/24/2020  . Thrombocytopenia affecting pregnancy (HCC) 10/21/2020  . Hypokalemia 10/20/2020  . Normal IUP (intrauterine pregnancy) on prenatal ultrasound, third trimester 10/20/2020  . Pneumonia due to COVID-19 virus 10/19/2020  . 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 Carrier of genetic disorder 07/10/2020  . 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 Trichomonal vaginitis during pregnancy in first trimester 07/03/2020  . Anemia 07/03/2020  . Maternal morbid obesity, antepartum (HCC) 06/29/2020  . Supervision of high risk pregnancy, antepartum 06/28/2020  . Central chest pain   . Sickle cell anemia with crisis (HCC)  06/20/2020  . Sore throat 10/06/2019  . Fever 10/06/2019  . Urinary tract infection symptoms 10/06/2019  . Sickle-cell disease with pain (HCC) 08/01/2017  . Sickle cell disease, type [place], with unspecified crisis (HCC) 07/31/2017  . Sickle cell anemia with pain (HCC) 05/28/2016  . 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 Sickle cell pain crisis (HCC) 01/29/2014  . Community acquired pneumonia 12/12/2011  . Sickle cell pain crisis 12/12/2011    Assessment / Plan: 20 y.o. G1P0 at [redacted]w[redacted]d here for IOL for DFM in the setting of A1GDM.  Labor: s/p FB, cyto x3. Transitioned to pitocin at 1140 on 1/28. FB out around 1900 on 1/28, stopped pitocin at 2100. Overnight pt received cyto x3. Placed Cook's catheter at 0945, now s/p expulsion. Pitocin restarted at 1145. Now s/p AROM for clear fluid at 1310. No advancement in fetal station and cervical dilation since last exam. Contractions continue to be inadequate despite efforts to up-titrate pitocin (limited by uterine tachysystole). On bedside ultrasound, baby appears ROT. Will attempt maternal reposition and will consider another pitocin break if still unchanged at the next cervical exam. Have discussed with pt the option of turning down the epidural--she is agreeable to trying this if needed.   #FWB: Category 2 strip given intermittent periods of minimal variability. Will attempt maternal repositioning and re-assess as clinically indicated.  #A1GDM:  q4 CBG, most  recent 90>93>72 >91 >111 >163 >92 >90. #Elevated Bps intrapartum: intermittent mild range Bps. Asymptomatic. Preeclampsia labs unremarkable except for elevated bilirubin of 5.4. UP:C 0.20. #Sickle Cell Disease: repeat CBC today stable from time of admission. Pain well controlled. Decreased IVF rate to 67ml/hr given prolonged duration of labor.  Fetal Wellbeing:  Cat I strip Pain Control:  Epidural   Sheila Oats, MD 01/01/2021, 6:20 PM

## 2021-01-01 NOTE — Progress Notes (Signed)
LABOR PROGRESS NOTE  Nicole Case is a 20 y.o. G1P0 at [redacted]w[redacted]d admitted for IOL for DFM.   Subjective: Pt resting comfortably in bed. No HA, vision changes or other concerns.  Objective: BP 108/80   Pulse (!) 108   Temp 97.8 F (36.6 C) (Axillary)   Resp 18   Ht 5\' 7"  (1.702 m)   Wt 124.7 kg   LMP 04/10/2020 (Exact Date)   SpO2 100%   BMI 43.07 kg/m  or  Vitals:   01/01/21 0810 01/01/21 0830 01/01/21 0900 01/01/21 0930  BP: 131/89 (!) 139/95 116/86 108/80  Pulse: 100 95 (!) 110 (!) 108  Resp: 18 16 18    Temp:      TempSrc:      SpO2:      Weight:      Height:        Dilation: 3 Effacement (%): 50 Cervical Position: Anterior Station: Ballotable Presentation: Vertex Exam by:: Dr. FHT: baseline rate 135, moderate variability/multiple accels/no decels  Toco: irregular, q2-3 min   Labs: Lab Results  Component Value Date   WBC 8.6 12/31/2020   HGB 9.1 (L) 12/31/2020   HCT 25.5 (L) 12/31/2020   MCV 72.4 (L) 12/31/2020   PLT 153 12/31/2020    Patient Active Problem List   Diagnosis Date Noted  . Decreased fetal movement during pregnancy in third trimester, antepartum 12/31/2020  . GBS (group B Streptococcus carrier), +RV culture, currently pregnant 12/27/2020  . History of COVID-19 11/24/2020  . Gestational diabetes mellitus, diet-controlled 11/24/2020  . Thrombocytopenia affecting pregnancy (HCC) 10/21/2020  . Hypokalemia 10/20/2020  . Normal IUP (intrauterine pregnancy) on prenatal ultrasound, third trimester 10/20/2020  . Pneumonia due to COVID-19 virus 10/19/2020  . 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 Carrier of genetic disorder 07/10/2020  . 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 Trichomonal vaginitis during pregnancy in first trimester 07/03/2020  . Anemia 07/03/2020  . Maternal morbid obesity, antepartum (HCC) 06/29/2020  . Supervision of high risk pregnancy, antepartum 06/28/2020  . Central chest pain    . Sickle cell anemia with crisis (HCC) 06/20/2020  . Sore throat 10/06/2019  . Fever 10/06/2019  . Urinary tract infection symptoms 10/06/2019  . Sickle-cell disease with pain (HCC) 08/01/2017  . Sickle cell disease, type [place], with unspecified crisis (HCC) 07/31/2017  . Sickle cell anemia with pain (HCC) 05/28/2016  . 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 Sickle cell pain crisis (HCC) 01/29/2014  . Community acquired pneumonia 12/12/2011  . Sickle cell pain crisis 12/12/2011    Assessment / Plan: 20 y.o. G1P0 at [redacted]w[redacted]d here for IOL for DFM in the setting of A1GDM.  Labor: s/p FB, cyto x3. Transitioned to pitocin at 1140 on 1/28. FB out around 1900 on 1/28, stopped pitocin at 2100. Overnight pt received cyto x3. Placed Cook's catheter to achieve improved cervical ripening given minimal cervical change. Will plan to restart pitocin at 1145, then AROM s/p expulsion of Cook's cath.  #A1GDM:  q4 CBG, most recent 90>93>72 >91 #Elevated Bps intrapartum: intermittent mild range Bps. Asymptomatic. F/u PreE labs.  Fetal Wellbeing:  Cat I strip Pain Control:  Epidural    2/28, MD 01/01/2021, 9:46 AM

## 2021-01-01 NOTE — Progress Notes (Signed)
RN Madalyn Rob assisted with Epidural replacement bag change.

## 2021-01-01 NOTE — Progress Notes (Signed)
LABOR PROGRESS NOTE  Nicole Case is a 20 y.o. G1P0 at [redacted]w[redacted]d admitted for IOL for DFM.   Subjective: Very tired   Objective: BP 134/80 (BP Location: Right Arm)   Pulse 79   Temp 98.1 F (36.7 C) (Oral)   Resp 19   Ht 5\' 7"  (1.702 m)   Wt 124.7 kg   LMP 04/10/2020 (Exact Date)   SpO2 100%   BMI 43.07 kg/m  or  Vitals:   01/01/21 0300 01/01/21 0330 01/01/21 0400 01/01/21 0510  BP: 128/69 (!) 142/61 134/80   Pulse: 82 79 79   Resp:  20  19  Temp:  98.1 F (36.7 C)    TempSrc:  Oral    SpO2:    100%  Weight:      Height:        Dilation: 3 Effacement (%): 50 Cervical Position: Anterior Station: Ballotable Presentation: Vertex Exam by:: Dr. [email or web address] FHT: baseline rate 130, moderate variability/pos accels/no decels  Toco: irregular, q3-5 min   Labs: Lab Results  Component Value Date   WBC 8.6 12/31/2020   HGB 9.1 (L) 12/31/2020   HCT 25.5 (L) 12/31/2020   MCV 72.4 (L) 12/31/2020   PLT 153 12/31/2020    Patient Active Problem List   Diagnosis Date Noted  . Decreased fetal movement during pregnancy in third trimester, antepartum 12/31/2020  . GBS (group B Streptococcus carrier), +RV culture, currently pregnant 12/27/2020  . History of COVID-19 11/24/2020  . Gestational diabetes mellitus, diet-controlled 11/24/2020  . Thrombocytopenia affecting pregnancy (HCC) 10/21/2020  . Hypokalemia 10/20/2020  . Normal IUP (intrauterine pregnancy) on prenatal ultrasound, third trimester 10/20/2020  . Pneumonia due to COVID-19 virus 10/19/2020  . 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 Carrier of genetic disorder 07/10/2020  . 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 Trichomonal vaginitis during pregnancy in first trimester 07/03/2020  . Anemia 07/03/2020  . Maternal morbid obesity, antepartum (HCC) 06/29/2020  . Supervision of high risk pregnancy, antepartum 06/28/2020  . Central chest pain   . Sickle cell anemia with crisis (HCC)  06/20/2020  . Sore throat 10/06/2019  . Fever 10/06/2019  . Urinary tract infection symptoms 10/06/2019  . Sickle-cell disease with pain (HCC) 08/01/2017  . Sickle cell disease, type [place], with unspecified crisis (HCC) 07/31/2017  . Sickle cell anemia with pain (HCC) 05/28/2016  . 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 Sickle cell pain crisis (HCC) 01/29/2014  . Community acquired pneumonia 12/12/2011  . Sickle cell pain crisis 12/12/2011    Assessment / Plan: 20 y.o. G1P0 at [redacted]w[redacted]d here for IOL for DFM. A1GDM   Labor: s/p FB, cyto x3. Transitioned to pitocin at 1140. FB out around 1900, stopped pitocin at 2100. Has now received two more cytotec doses. Will redose cytotec additional time. Consider AROM and starting pitocin at next exam.   #A1GDM:  q4 CBG, most recent 90>93>72    Fetal Wellbeing:  Cat I  Pain Control:  Epidural  Anticipated MOD:  SVD  2101, MD 01/01/2021, 7:35 AM

## 2021-01-02 ENCOUNTER — Encounter (HOSPITAL_COMMUNITY): Payer: Self-pay | Admitting: Family Medicine

## 2021-01-02 DIAGNOSIS — Z3A38 38 weeks gestation of pregnancy: Secondary | ICD-10-CM | POA: Diagnosis not present

## 2021-01-02 DIAGNOSIS — O99824 Streptococcus B carrier state complicating childbirth: Secondary | ICD-10-CM | POA: Diagnosis not present

## 2021-01-02 DIAGNOSIS — O368130 Decreased fetal movements, third trimester, not applicable or unspecified: Secondary | ICD-10-CM | POA: Diagnosis not present

## 2021-01-02 LAB — COMPREHENSIVE METABOLIC PANEL
ALT: 10 U/L (ref 0–44)
AST: 17 U/L (ref 15–41)
Albumin: 2.4 g/dL — ABNORMAL LOW (ref 3.5–5.0)
Alkaline Phosphatase: 129 U/L — ABNORMAL HIGH (ref 38–126)
Anion gap: 10 (ref 5–15)
BUN: 6 mg/dL (ref 6–20)
CO2: 22 mmol/L (ref 22–32)
Calcium: 8.7 mg/dL — ABNORMAL LOW (ref 8.9–10.3)
Chloride: 104 mmol/L (ref 98–111)
Creatinine, Ser: 0.81 mg/dL (ref 0.44–1.00)
GFR, Estimated: >60 mL/min (ref >60)
Glucose, Bld: 105 mg/dL — ABNORMAL HIGH (ref 70–99)
Potassium: 3.9 mmol/L (ref 3.5–5.1)
Sodium: 136 mmol/L (ref 135–145)
Total Bilirubin: 4.9 mg/dL — ABNORMAL HIGH (ref 0.3–1.2)
Total Protein: 6 g/dL — ABNORMAL LOW (ref 6.5–8.1)

## 2021-01-02 LAB — CBC
HCT: 26.7 % — ABNORMAL LOW (ref 36.0–46.0)
HCT: 23.6 % — ABNORMAL LOW (ref 36.0–46.0)
HCT: 22.8 % — ABNORMAL LOW (ref 36.0–46.0)
Hemoglobin: 9.1 g/dL — ABNORMAL LOW (ref 12.0–15.0)
Hemoglobin: 8.5 g/dL — ABNORMAL LOW (ref 12.0–15.0)
Hemoglobin: 7.9 g/dL — ABNORMAL LOW (ref 12.0–15.0)
MCH: 25.1 pg — ABNORMAL LOW (ref 26.0–34.0)
MCH: 26.1 pg (ref 26.0–34.0)
MCH: 25.2 pg — ABNORMAL LOW (ref 26.0–34.0)
MCHC: 34.1 g/dL (ref 30.0–36.0)
MCHC: 36 g/dL (ref 30.0–36.0)
MCHC: 34.6 g/dL (ref 30.0–36.0)
MCV: 73.6 fL — ABNORMAL LOW (ref 80.0–100.0)
MCV: 72.4 fL — ABNORMAL LOW (ref 80.0–100.0)
MCV: 72.8 fL — ABNORMAL LOW (ref 80.0–100.0)
Platelets: 150 10*3/uL (ref 150–400)
Platelets: 154 10*3/uL (ref 150–400)
Platelets: 145 10*3/uL — ABNORMAL LOW (ref 150–400)
RBC: 3.63 MIL/uL — ABNORMAL LOW (ref 3.87–5.11)
RBC: 3.26 MIL/uL — ABNORMAL LOW (ref 3.87–5.11)
RBC: 3.13 MIL/uL — ABNORMAL LOW (ref 3.87–5.11)
RDW: 20.7 % — ABNORMAL HIGH (ref 11.5–15.5)
RDW: 20.8 % — ABNORMAL HIGH (ref 11.5–15.5)
RDW: 20.8 % — ABNORMAL HIGH (ref 11.5–15.5)
WBC: 13.2 10*3/uL — ABNORMAL HIGH (ref 4.0–10.5)
WBC: 16.6 10*3/uL — ABNORMAL HIGH (ref 4.0–10.5)
WBC: 17.2 10*3/uL — ABNORMAL HIGH (ref 4.0–10.5)
nRBC: 0 % (ref 0.0–0.2)
nRBC: 0 % (ref 0.0–0.2)
nRBC: 0.1 % (ref 0.0–0.2)

## 2021-01-02 LAB — GLUCOSE, CAPILLARY: Glucose-Capillary: 100 mg/dL — ABNORMAL HIGH (ref 70–99)

## 2021-01-02 MED ORDER — ACETAMINOPHEN 325 MG PO TABS
650.0000 mg | ORAL_TABLET | ORAL | Status: DC | PRN
  Administered 2021-01-02 – 2021-01-03 (×2): 650 mg via ORAL
  Filled 2021-01-02 (×2): qty 2

## 2021-01-02 MED ORDER — DIPHENHYDRAMINE HCL 25 MG PO CAPS: 25.0000 mg | ORAL_CAPSULE | Freq: Four times a day (QID) | ORAL | Status: DC | PRN

## 2021-01-02 MED ORDER — AMLODIPINE BESYLATE 5 MG PO TABS
5.0000 mg | ORAL_TABLET | Freq: Every day | ORAL | Status: DC
  Administered 2021-01-02 – 2021-01-04 (×3): 5 mg via ORAL
  Filled 2021-01-02 (×3): qty 1

## 2021-01-02 MED ORDER — BUPIVACAINE HCL (PF) 0.25 % IJ SOLN
INTRAMUSCULAR | Status: DC | PRN
  Administered 2021-01-02: 8 mL via EPIDURAL

## 2021-01-02 MED ORDER — FENTANYL CITRATE (PF) 100 MCG/2ML IJ SOLN
INTRAMUSCULAR | Status: DC | PRN
  Administered 2021-01-02: 100 ug via EPIDURAL

## 2021-01-02 MED ORDER — CARBOPROST TROMETHAMINE 250 MCG/ML IM SOLN
INTRAMUSCULAR | Status: AC
  Administered 2021-01-02: 250 ug via INTRAMUSCULAR
  Filled 2021-01-02: qty 1

## 2021-01-02 MED ORDER — DOCUSATE SODIUM 100 MG PO CAPS
100.0000 mg | ORAL_CAPSULE | Freq: Two times a day (BID) | ORAL | Status: DC
  Administered 2021-01-03 (×2): 100 mg via ORAL
  Filled 2021-01-02 (×4): qty 1

## 2021-01-02 MED ORDER — TETANUS-DIPHTH-ACELL PERTUSSIS 5-2.5-18.5 LF-MCG/0.5 IM SUSY: 0.5000 mL | PREFILLED_SYRINGE | Freq: Once | INTRAMUSCULAR | Status: DC

## 2021-01-02 MED ORDER — COCONUT OIL OIL: 1.0000 "application " | TOPICAL_OIL | Status: DC | PRN

## 2021-01-02 MED ORDER — ONDANSETRON HCL 4 MG PO TABS: 4.0000 mg | ORAL_TABLET | ORAL | Status: DC | PRN

## 2021-01-02 MED ORDER — CARBOPROST TROMETHAMINE 250 MCG/ML IM SOLN: 250.0000 ug | Freq: Once | INTRAMUSCULAR | Status: AC

## 2021-01-02 MED ORDER — DIPHENHYDRAMINE HCL 50 MG/ML IJ SOLN
25.0000 mg | Freq: Once | INTRAMUSCULAR | Status: AC
  Administered 2021-01-02: 25 mg via INTRAVENOUS
  Filled 2021-01-02: qty 1

## 2021-01-02 MED ORDER — DIBUCAINE (PERIANAL) 1 % EX OINT: 1.0000 "application " | TOPICAL_OINTMENT | CUTANEOUS | Status: DC | PRN

## 2021-01-02 MED ORDER — TRANEXAMIC ACID-NACL 1000-0.7 MG/100ML-% IV SOLN
INTRAVENOUS | Status: AC
  Filled 2021-01-02: qty 100

## 2021-01-02 MED ORDER — WITCH HAZEL-GLYCERIN EX PADS: 1.0000 "application " | MEDICATED_PAD | CUTANEOUS | Status: DC | PRN

## 2021-01-02 MED ORDER — DIPHENOXYLATE-ATROPINE 2.5-0.025 MG PO TABS
2.0000 | ORAL_TABLET | Freq: Four times a day (QID) | ORAL | Status: DC | PRN
  Administered 2021-01-02: 2 via ORAL
  Filled 2021-01-02: qty 2

## 2021-01-02 MED ORDER — SENNOSIDES-DOCUSATE SODIUM 8.6-50 MG PO TABS
2.0000 | ORAL_TABLET | ORAL | Status: DC
  Administered 2021-01-03: 2 via ORAL
  Filled 2021-01-02 (×2): qty 2

## 2021-01-02 MED ORDER — MEASLES, MUMPS & RUBELLA VAC IJ SOLR: 0.5000 mL | Freq: Once | INTRAMUSCULAR | Status: DC

## 2021-01-02 MED ORDER — DIPHENOXYLATE-ATROPINE 2.5-0.025 MG PO TABS
1.0000 | ORAL_TABLET | Freq: Once | ORAL | Status: AC
  Administered 2021-01-02: 1 via ORAL
  Filled 2021-01-02: qty 1

## 2021-01-02 MED ORDER — ERYTHROMYCIN 5 MG/GM OP OINT
TOPICAL_OINTMENT | OPHTHALMIC | Status: AC
  Filled 2021-01-02: qty 1

## 2021-01-02 MED ORDER — MISOPROSTOL 200 MCG PO TABS
1000.0000 ug | ORAL_TABLET | Freq: Once | ORAL | Status: AC
  Administered 2021-01-02: 1000 ug via RECTAL

## 2021-01-02 MED ORDER — ONDANSETRON HCL 4 MG/2ML IJ SOLN: 4.0000 mg | INTRAMUSCULAR | Status: DC | PRN

## 2021-01-02 MED ORDER — PRENATAL MULTIVITAMIN CH
1.0000 | ORAL_TABLET | Freq: Every day | ORAL | Status: DC
  Administered 2021-01-02 – 2021-01-03 (×2): 1 via ORAL
  Filled 2021-01-02 (×2): qty 1

## 2021-01-02 MED ORDER — PNEUMOCOCCAL VAC POLYVALENT 25 MCG/0.5ML IJ INJ: 0.5000 mL | INJECTION | INTRAMUSCULAR | Status: DC

## 2021-01-02 MED ORDER — PROMETHAZINE HCL 25 MG/ML IJ SOLN
12.5000 mg | Freq: Four times a day (QID) | INTRAMUSCULAR | Status: DC | PRN
  Administered 2021-01-02: 12.5 mg via INTRAVENOUS
  Filled 2021-01-02: qty 1

## 2021-01-02 MED ORDER — METHYLERGONOVINE MALEATE 0.2 MG/ML IJ SOLN
INTRAMUSCULAR | Status: AC
  Filled 2021-01-02: qty 1

## 2021-01-02 MED ORDER — BENZOCAINE-MENTHOL 20-0.5 % EX AERO
1.0000 "application " | INHALATION_SPRAY | CUTANEOUS | Status: DC | PRN
  Administered 2021-01-02: 1 via TOPICAL
  Filled 2021-01-02: qty 56

## 2021-01-02 MED ORDER — IBUPROFEN 600 MG PO TABS
600.0000 mg | ORAL_TABLET | Freq: Four times a day (QID) | ORAL | Status: DC
  Administered 2021-01-02 – 2021-01-04 (×8): 600 mg via ORAL
  Filled 2021-01-02 (×8): qty 1

## 2021-01-02 MED ORDER — MISOPROSTOL 200 MCG PO TABS
ORAL_TABLET | ORAL | Status: AC
  Filled 2021-01-02: qty 5

## 2021-01-02 MED ORDER — TRANEXAMIC ACID-NACL 1000-0.7 MG/100ML-% IV SOLN
1000.0000 mg | INTRAVENOUS | Status: AC
  Administered 2021-01-02: 1000 mg via INTRAVENOUS

## 2021-01-02 MED ORDER — SIMETHICONE 80 MG PO CHEW: 80.0000 mg | CHEWABLE_TABLET | ORAL | Status: DC | PRN

## 2021-01-02 NOTE — Lactation Note (Signed)
This note was copied from a baby's chart. Lactation Consultation Note  Patient Name: Nicole Case [id number] Date: 01/02/2021   [facility] went to see infant and received report from RN, Rosilyn Mings to follow up with infant at 20:30 pm. Mom was s/p PPH and infant experienced some low sugars. RN just fed the baby and Mom was resting.

## 2021-01-02 NOTE — Progress Notes (Signed)
LABOR PROGRESS NOTE  Nicole Case is a 20 y.o. G1P0 at [redacted]w[redacted]d admitted for IOL for DFM.   Subjective: Pt resting comfortably  Objective: BP (!) 149/95   Pulse (!) 105   Temp 98.4 F (36.9 C)   Resp 16   Ht 5\' 7"  (1.702 m)   Wt 124.7 kg   LMP 04/10/2020 (Exact Date)   SpO2 100%   BMI 43.07 kg/m  or  Vitals:   01/01/21 2330 01/02/21 0000 01/02/21 0030 01/02/21 0100  BP: 122/86 (!) 136/95 (!) 148/84 (!) 149/95  Pulse: (!) 113 (!) 104 (!) 105 (!) 105  Resp:  16    Temp:      TempSrc:      SpO2:      Weight:      Height:        Dilation: 4.5 Effacement (%): 90 Cervical Position: Anterior Station: 0 Presentation: Vertex Exam by:: Dr [email or web address] FHT: baseline rate 145, minimal-moderate variability/no accels/no decels  Toco: q1-2 min   Labs: Lab Results  Component Value Date   WBC 10.3 01/01/2021   HGB 9.5 (L) 01/01/2021   HCT 28.2 (L) 01/01/2021   MCV 73.8 (L) 01/01/2021   PLT 154 01/01/2021    Patient Active Problem List   Diagnosis Date Noted  . Decreased fetal movement during pregnancy in third trimester, antepartum 12/31/2020  . GBS (group B Streptococcus carrier), +RV culture, currently pregnant 12/27/2020  . History of COVID-19 11/24/2020  . Gestational diabetes mellitus, diet-controlled 11/24/2020  . Thrombocytopenia affecting pregnancy (HCC) 10/21/2020  . Hypokalemia 10/20/2020  . Normal IUP (intrauterine pregnancy) on prenatal ultrasound, third trimester 10/20/2020  . Pneumonia due to COVID-19 virus 10/19/2020  . 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 Carrier of genetic disorder 07/10/2020  . 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 Trichomonal vaginitis during pregnancy in first trimester 07/03/2020  . Anemia 07/03/2020  . Maternal morbid obesity, antepartum (HCC) 06/29/2020  . Supervision of high risk pregnancy, antepartum 06/28/2020  . Central chest pain   . Sickle cell anemia with crisis (HCC) 06/20/2020  . Sore  throat 10/06/2019  . Fever 10/06/2019  . Urinary tract infection symptoms 10/06/2019  . Sickle-cell disease with pain (HCC) 08/01/2017  . Sickle cell disease, type [place], with unspecified crisis (HCC) 07/31/2017  . Sickle cell anemia with pain (HCC) 05/28/2016  . 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 Sickle cell pain crisis (HCC) 01/29/2014  . Community acquired pneumonia 12/12/2011  . Sickle cell pain crisis 12/12/2011    Assessment / Plan: 20 y.o. G1P0 at [redacted]w[redacted]d here for IOL for DFM in the setting of A1GDM.  Labor: s/p FB, cyto x3. Transitioned to pitocin at 1140 on 1/28. FB out around 1900 on 1/28, stopped pitocin at 2100. Overnight pt received cyto x3. Placed Cook's catheter at 0945, now s/p expulsion. Pitocin restarted at 1145. S/p AROM for clear fluid at 1310. IUPC placed and ctx remained inadequate despite efforts to titrate pitocin, which were limited by tachysystole. Patient advanced to 4/70/-1/0 and pit break taken for minimal variability. 4 hours later, patient is 4/90/0, restarting pitocin. On bedside ultrasound, baby appears ROT. Will continue repositioning.  #FWB: Category 2 strip given intermittent periods of minimal variability, which resolved with pitocin break. No accels, but reassuring variability now. Will continue to monitor.  #A1GDM:  q4 CBG, most recent 90>93>72 >91 >111 >163 >92 >90>94>110. Continue to monitor.   #Elevated Bps intrapartum: intermittent mild range Bps. Asymptomatic. Preeclampsia  labs unremarkable except for elevated bilirubin of 5.4. UP:C 0.20  #Sickle Cell Disease: repeat CBC today stable from time of admission. Pain well controlled. Decreased IVF rate to 29ml/hr given prolonged duration of labor.  Pain Control:  Epidural   Shirlean Mylar, MD 01/02/2021, 1:12 AM

## 2021-01-02 NOTE — Progress Notes (Signed)
During assessment of patient, epidural tubing was torn apart. OB Anesthesiologist notified and came to bedside to assess/repair.     Marko Stai, RN  01/01/21 9:03PM

## 2021-01-02 NOTE — Progress Notes (Signed)
LABOR PROGRESS NOTE  Nicole Case is a 20 y.o. G1P0 at [redacted]w[redacted]d admitted for IOL for DFM.   Subjective: Pt resting comfortably  Objective: BP 136/85   Pulse 100   Temp 98.5 F (36.9 C) (Oral)   Resp 16   Ht 5\' 7"  (1.702 m)   Wt 124.7 kg   LMP 04/10/2020 (Exact Date)   SpO2 97%   BMI 43.07 kg/m  or  Vitals:   01/02/21 0345 01/02/21 0350 01/02/21 0400 01/02/21 0430  BP: (!) 146/106 (!) 137/101 (!) 147/105 136/85  Pulse: (!) 102 97 97 100  Resp:  16 15 16   Temp:      TempSrc:      SpO2:      Weight:      Height:        Dilation: 4.5 Effacement (%): 90 Cervical Position: Anterior Station: 0 Presentation: Vertex Exam by:: RN FHT: baseline rate 145, minimal-moderate variability/no accels/no decels  Toco: q1-2 min   Labs: Lab Results  Component Value Date   WBC 10.3 01/01/2021   HGB 9.5 (L) 01/01/2021   HCT 28.2 (L) 01/01/2021   MCV 73.8 (L) 01/01/2021   PLT 154 01/01/2021    Patient Active Problem List   Diagnosis Date Noted  . Decreased fetal movement during pregnancy in third trimester, antepartum 12/31/2020  . GBS (group B Streptococcus carrier), +RV culture, currently pregnant 12/27/2020  . History of COVID-19 11/24/2020  . Gestational diabetes mellitus, diet-controlled 11/24/2020  . Thrombocytopenia affecting pregnancy (HCC) 10/21/2020  . Hypokalemia 10/20/2020  . Normal IUP (intrauterine pregnancy) on prenatal ultrasound, third trimester 10/20/2020  . Pneumonia due to COVID-19 virus 10/19/2020  . 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 Carrier of genetic disorder 07/10/2020  . 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 Trichomonal vaginitis during pregnancy in first trimester 07/03/2020  . Anemia 07/03/2020  . Maternal morbid obesity, antepartum (HCC) 06/29/2020  . Supervision of high risk pregnancy, antepartum 06/28/2020  . Central chest pain   . Sickle cell anemia with crisis (HCC) 06/20/2020  . Sore  throat 10/06/2019  . Fever 10/06/2019  . Urinary tract infection symptoms 10/06/2019  . Sickle-cell disease with pain (HCC) 08/01/2017  . Sickle cell disease, type [place], with unspecified crisis (HCC) 07/31/2017  . Sickle cell anemia with pain (HCC) 05/28/2016  . 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 Sickle cell pain crisis (HCC) 01/29/2014  . Community acquired pneumonia 12/12/2011  . Sickle cell pain crisis 12/12/2011    Assessment / Plan: 20 y.o. G1P0 at [redacted]w[redacted]d here for IOL for DFM in the setting of A1GDM.  Labor: s/p FB, cyto x3. Transitioned to pitocin at 1140 on 1/28. FB out around 1900 on 1/28, stopped pitocin at 2100. Overnight pt received cyto x3. Placed Cook's catheter at 0945, now s/p expulsion. Pitocin restarted at 1145. S/p AROM for clear fluid at 1310. IUPC placed and ctx remained inadequate despite efforts to titrate pitocin, which were limited by tachysystole. Patient advanced to 4/70/-1/0 and pit break taken for minimal variability. Patient restarted pitocin at 0100 with same exam. Now pitocin at 6 miliunits/min and same exam, patient feeling a lot of pain in lower abdomen and legs. MVUs are not yet adequate, best avg 120-150. Continue to up-titrate pit as able. Anesthesia assessed epidural. Baby appeared ROT earlier by bedside ultrasound; will continue repositioning.  #FWB: Category 2 strip given intermittent periods of minimal variability, which improved since pitocin break. No accels, but  reassuring variability now. Will continue to monitor.  #A1GDM:  q4 CBG, most recent 90>93>72 >91 >111 >163 >92 >90>94>110. Continue to monitor.   #Elevated Bps intrapartum: Patient has had MR pressures throughout indcution, but two DBPs of 112 at 2AM and 3AM. She is currently asymptomatic, reflexes are +2. Preeclampsia labs unremarkable on admission except for elevated bilirubin of 5.4. UP:C 0.20; will repeat them now. If another severe range BP, patient will need to start Mg.  #Sickle Cell  Disease: repeat CBC today stable from time of admission. Pain well controlled. Decreased IVF rate to 57ml/hr given prolonged duration of labor.  Pain Control:  Epidural   Shirlean Mylar, MD 01/02/2021, 4:34 AM

## 2021-01-02 NOTE — Lactation Note (Signed)
This note was copied from a baby's chart. Lactation Consultation Note  Patient Name: Nicole Case [id number] Date: 01/02/2021 Reason for consult: Initial assessment;1st time breastfeeding;Early term 37-38.6wks Age:22 hours Per mom, she is active on the [facility] in [place]. Per dad, infant had 3 stools and 2 wet diapers since birth. Per mom, she was given hand pump earlier by RN, mom doesn't have breast pump at home. Mom feeding choice at admission was formula but she decided to breastfeed infant. Per mom, she made two attempts to latch infant at the breast,  but mostly infant has been formula feeding and not sustain latch, he falls asleep. LC asked mom undress infant and breastfeed  Infant STS, LC also, discussed breast stimulation techniques to keep infant awake while breastfeeding such as: talking to infant, doing breast compressions, gently stroking infant's neck and shoulder. Mom latched infant on her left breast using the football hold position, RN assisted LC with supplementing infant at the breast with a curve tip syringe, dad finished supplementing infant with curve tip at breast. Infant latched with depth, swallows observed, infant took 5 mls of formula at the breast and breastfeed for 15 minutes. Mom was pleased with infant latching. Mom knows to breastfeed infant according to cues, 8 to 12+ times within 24 hours, STS. LC discussed infant's input and output with parents. Mom knows to call RN or [facility] services if she needs further assistance with latching infant at the breast. Mom made aware of O/P services, breastfeeding support groups, community resources, and our phone # for post-discharge questions.   Maternal Data Has patient been taught Hand Expression?: Yes Does the patient have breastfeeding experience prior to this delivery?: No  Feeding Feeding Type: Breast Fed Nipple Type: Slow - flow  LATCH Score Latch: Grasps breast easily, tongue down, lips flanged,  rhythmical sucking.  Audible Swallowing: Spontaneous and intermittent  Type of Nipple: Everted at rest and after stimulation  Comfort (Breast/Nipple): Soft / non-tender  Hold (Positioning): Assistance needed to correctly position infant at breast and maintain latch.  LATCH Score: 9  Interventions Interventions: Assisted with latch;Skin to skin;Breast massage;Hand express;Breast compression;Adjust position;Support pillows;Position options;Expressed milk;Hand pump;Breast feeding basics reviewed  Lactation Tools Discussed/Used Tools: Other (comment) (infant took 5 mls of formula at breast using a curve tip syringe) WIC Program: Yes Pump Education: Setup, frequency, and cleaning;Milk Storage Initiated by:: RN Date initiated:: 01/02/21   Consult Status Consult Status: Follow-up Date: 01/03/21 Follow-up type: In-patient    Danelle Earthly 01/02/2021, 11:25 PM

## 2021-01-03 LAB — CBC
HCT: 18.1 % — ABNORMAL LOW (ref 36.0–46.0)
HCT: 22 % — ABNORMAL LOW (ref 36.0–46.0)
Hemoglobin: 6.2 g/dL — CL (ref 12.0–15.0)
Hemoglobin: 7.6 g/dL — ABNORMAL LOW (ref 12.0–15.0)
MCH: 25.4 pg — ABNORMAL LOW (ref 26.0–34.0)
MCH: 26.1 pg (ref 26.0–34.0)
MCHC: 34.3 g/dL (ref 30.0–36.0)
MCHC: 34.5 g/dL (ref 30.0–36.0)
MCV: 74.2 fL — ABNORMAL LOW (ref 80.0–100.0)
MCV: 75.6 fL — ABNORMAL LOW (ref 80.0–100.0)
Platelets: 137 10*3/uL — ABNORMAL LOW (ref 150–400)
Platelets: 143 10*3/uL — ABNORMAL LOW (ref 150–400)
RBC: 2.44 MIL/uL — ABNORMAL LOW (ref 3.87–5.11)
RBC: 2.91 MIL/uL — ABNORMAL LOW (ref 3.87–5.11)
RDW: 20.5 % — ABNORMAL HIGH (ref 11.5–15.5)
RDW: 20.5 % — ABNORMAL HIGH (ref 11.5–15.5)
WBC: 9.4 10*3/uL (ref 4.0–10.5)
WBC: 11.1 10*3/uL — ABNORMAL HIGH (ref 4.0–10.5)
nRBC: 0 % (ref 0.0–0.2)
nRBC: 0 % (ref 0.0–0.2)

## 2021-01-03 LAB — RETICULOCYTES
Immature Retic Fract: 38.3 % — ABNORMAL HIGH (ref 2.3–15.9)
RBC.: 2.9 MIL/uL — ABNORMAL LOW (ref 3.87–5.11)
Retic Count, Absolute: 171.4 10*3/uL (ref 19.0–186.0)
Retic Ct Pct: 5.9 % — ABNORMAL HIGH (ref 0.4–3.1)

## 2021-01-03 LAB — PREPARE RBC (CROSSMATCH)

## 2021-01-03 LAB — GLUCOSE, CAPILLARY
Glucose-Capillary: 69 mg/dL — ABNORMAL LOW (ref 70–99)
Glucose-Capillary: 94 mg/dL (ref 70–99)

## 2021-01-03 MED ORDER — SODIUM CHLORIDE 0.9% IV SOLUTION: Freq: Once | INTRAVENOUS | Status: DC

## 2021-01-03 MED ORDER — FAMOTIDINE IN NACL 20-0.9 MG/50ML-% IV SOLN
20.0000 mg | Freq: Two times a day (BID) | INTRAVENOUS | Status: DC
  Filled 2021-01-03: qty 50

## 2021-01-03 MED ORDER — FAMOTIDINE 20 MG PO TABS
20.0000 mg | ORAL_TABLET | Freq: Two times a day (BID) | ORAL | Status: DC
  Administered 2021-01-03 – 2021-01-04 (×3): 20 mg via ORAL
  Filled 2021-01-03 (×3): qty 1

## 2021-01-03 NOTE — Progress Notes (Signed)
CSW received consult for hx of Anxiety.  CSW met with MOB to offer support and complete assessment.    CSW congratulated MOB on the birth of infant. CSW advised MOB of CSW's role and the reason for CSW coming to speak with her. MOB expressed that she was diagnose with anxiety in 2021. MOB indicated that her anxiety is at that time was around her health as MOB expressed that she was in the hospital. MOB expressed that she was never given any medications and reported no hx of therapy use. MOB was offered therapy in which MOB expressed no desire for at this time. MOB expressed that she has no other known mental health hx and denies SI, HI and DV to CSW when asked.   CSW inquired from [facility] on who her supports are. MOB expressed that she has support from her FOB and his mother. MOB expressed that her mother "is not here" however CSW did not probe this statement. MOB expressed that she has all needed items to care for infant with plans for infant to sleep in crib once arrived home. MOB expressed no other needs.   CSW provided education regarding the baby blues period vs. perinatal mood disorders, discussed treatment and gave resources for mental health follow up if concerns arise.  CSW recommends self-evaluation during the postpartum time period using the New Mom Checklist from Postpartum Progress and encouraged MOB to contact a medical professional if symptoms are noted at any time.   CSW provided review of Sudden Infant Death Syndrome (SIDS) precautions.   CSW identifies no further need for intervention and no barriers to discharge at this time.   CSW received consult for hx of marijuana use.  Referral was screened out due to the following: ~MOB had no documented substance use after initial prenatal visit/+UPT. ~MOB had no positive drug screens after initial prenatal visit/+UPT. ~Baby's UDS is negative.  Please consult CSW if current concerns arise or by MOB's request.  CSW will monitor CDS results and  make report to Child Protective Services if warranted.   Nicole Case, MSW, LCSW [facility] [place] [phone]

## 2021-01-03 NOTE — Lactation Note (Incomplete)
This note was copied from a baby's chart. Lactation Consultation Note  Patient Name: [name] SLHTD'S Date: 01/03/2021 Reason for consult: Follow-up assessment;Early term 37-38.6wks Age:22 hours  LC [employer] and LC student [employer]ion Katrinka Blazing entered the room and noticed mother holding swaddled infant. There was a support person laying down on the chair. Mother stated breastfeeding was going ok. She stated the baby will latch but will not stay on. She stated she had not tried to latch him since this morning. She stated her goal was just to make sure baby is fed. [facility] student Passion Katrinka Blazing reviewed breastfeeding basics, milk transitioning, and cluster feeding. [facility] student informed mother that if she wants assistance with latching she can call lactation when she thinks that baby is ready to feed. Mother had no further questions. Mother stated she is on [facility] and she does have a pump.   Maternal Data Formula Feeding for Exclusion: Yes Reason for exclusion: Mother's choice to formula feed on admision  Feeding Feeding Type: Formula Nipple Type: Slow - flow   Interventions Interventions: Breast feeding basics reviewed  Lactation Tools Discussed/Used WIC Program: Yes   Consult Status Consult Status: Follow-up Date: 01/04/21 Follow-up type: In-patient    Passion Katrinka Blazing 01/03/2021, 5:58 PM

## 2021-01-03 NOTE — Progress Notes (Signed)
Significant bruising at IV site; IV and dressng had been  partially removed by patient when site was assessed. Patient was complaining of discomfort and dislocation so IV was removed. Patient educated on possibly being stuck again if IV access is needed during the duration of her stay.

## 2021-01-03 NOTE — Anesthesia Postprocedure Evaluation (Signed)
Anesthesia Post Note  Patient: Nicole Case  Procedure(s) Performed: AN AD HOC LABOR EPIDURAL     Patient location during evaluation: Mother Baby Anesthesia Type: Epidural Level of consciousness: awake, awake and alert and oriented Pain management: pain level controlled Vital Signs Assessment: post-procedure vital signs reviewed and stable Respiratory status: spontaneous breathing, nonlabored ventilation and respiratory function stable Cardiovascular status: stable Postop Assessment: no headache, patient able to bend at knees, no apparent nausea or vomiting, no backache, adequate PO intake and able to ambulate Anesthetic complications: no   No complications documented.  Last Vitals:  Vitals:   01/02/21 2230 01/03/21 0530  BP: 123/72 107/78  Pulse: 97 98  Resp: 16 16  Temp: 36.9 C 36.8 C  SpO2:      Last Pain:  Vitals:   01/03/21 0530  TempSrc: Tympanic  PainSc:    Pain Goal: Patients Stated Pain Goal: 3 (01/02/21 1115)                 Pavneet Markwood

## 2021-01-03 NOTE — Progress Notes (Addendum)
CRITICAL VALUE ALERT  Critical Value:  hemaglobin 6.2  Date & Time Notied:  01/03/2021 @0610   Provider Notified: [place]  Orders Received/Actions taken: No new orders at this time. Provider notified

## 2021-01-03 NOTE — Lactation Note (Signed)
This note was copied from a baby's chart. Lactation Consultation Note  Patient Name: Nicole Case [id number] Date: 01/03/2021 Reason for consult: Follow-up assessment;Early term 37-38.6wks Age:22 hours  LC Brekyn [place] and LC student Passion Katrinka Blazing entered the room and noticed mother holding swaddled infant. There was a support person laying down on the chair. Mother stated breastfeeding was going ok. She stated the baby will latch but will not stay on. She stated she had not tried to latch him since this morning. She stated her goal was just to make sure baby is fed. [facility] student Passion Katrinka Blazing reviewed breastfeeding basics, milk transitioning, and cluster feeding. [facility] student informed mother that if she wants assistance with latching she can call lactation when she thinks that baby is ready to feed. Mother had no further questions. Mother stated she is on [facility] and she does have a pump.   Maternal Data Formula Feeding for Exclusion: Yes Reason for exclusion: Mother's choice to formula feed on admision  Feeding Feeding Type: Formula Nipple Type: Slow - flow  Interventions Interventions: Breast feeding basics reviewed  Lactation Tools Discussed/Used WIC Program: Yes  Consult Status Consult Status: Follow-up Date: 01/04/21 Follow-up type: In-patient  Passion Katrinka Blazing -[place] Student 01/03/2021, 5:58 PM  Khamiya Varin A Higuera Ancidey 01/03/2021, 6:28 PM

## 2021-01-03 NOTE — Progress Notes (Addendum)
Post Partum Day  1 Subjective: no complaints, up ad lib, voiding, tolerating PO and + flatus, +BM  Objective: Blood pressure 129/70, pulse 99, temperature 98.7 F (37.1 C), temperature source Oral, resp. rate 18, height 5\' 7"  (1.702 m), weight 124.7 kg, last menstrual period 04/10/2020, SpO2 99 %, unknown if currently breastfeeding.  Physical Exam:  General: alert, cooperative, appears stated age, no distress and moderately obese Lochia: appropriate Uterine Fundus: firm Incision: n/a DVT Evaluation: No evidence of DVT seen on physical exam.  Recent Labs    01/03/21 0455 01/03/21 1402  HGB 6.2* 7.6*  HCT 18.1* 22.0*    Assessment/Plan: Plan for discharge tomorrow . Continue normal postpartum care.  #Sickle Cell Anemia: patient's baseline hgb at 9.8 and she lost ~800cc during delivery. Today, hgb 6.9, transfused 1U pRBCs. Post transfusion h/h appropriate at 7.6. Will also recommend po iron. Retic appropriate at 5.9%. #Gestational thrombocytopenia: Platelets appropriate today at 143. #A1GDM- Fasting BG low at 69 this morning, repeat 94. Will have post partum follow up with 2hr GTT. #gHTN: BP elevated to 130/100s, 140/100s. Start amlodipine 5 mg and will have 1 week PP follow up for BP.   LOS: 4 days   01/05/21 01/03/2021, 6:25 PM   Midwife attestation I have seen and examined this patient and agree with above documentation in the resident's note.   Post Partum Day 1  Nicole Case is a 20 y.o. G1P1001 s/p SVD.  Pt denies problems with ambulating, voiding or po intake. Pain is well controlled. Method of Feeding: bottle  PE:  Gen: well appearing Heart: reg rate Lungs: normal WOB Fundus firm Ext: soft, no pain, no edema  Assessment: S/p SVD PPD #1  Plan for discharge: tomorrow  36, CNM 6:41 PM

## 2021-01-04 ENCOUNTER — Other Ambulatory Visit (HOSPITAL_COMMUNITY): Payer: Self-pay | Admitting: Family Medicine

## 2021-01-04 DIAGNOSIS — O139 Gestational [pregnancy-induced] hypertension without significant proteinuria, unspecified trimester: Secondary | ICD-10-CM

## 2021-01-04 LAB — TYPE AND SCREEN
ABO/RH(D): A POS
Antibody Screen: NEGATIVE
Unit division: 0

## 2021-01-04 LAB — BPAM RBC
Blood Product Expiration Date: 202202032359
ISSUE DATE / TIME: 202201310900
Unit Type and Rh: 9500

## 2021-01-04 MED ORDER — COCONUT OIL OIL: 1.0000 "application " | TOPICAL_OIL | 0 refills | Status: DC | PRN

## 2021-01-04 MED ORDER — AMLODIPINE BESYLATE 5 MG PO TABS: 5.0000 mg | ORAL_TABLET | Freq: Every day | ORAL | 0 refills | Status: DC

## 2021-01-04 MED ORDER — IBUPROFEN 800 MG PO TABS: 800.0000 mg | ORAL_TABLET | Freq: Three times a day (TID) | ORAL | 0 refills | Status: DC | PRN

## 2021-01-04 MED FILL — IBUPROFEN 800 MG TAB: 800 | 10 days supply | Qty: 30 | Fill #0

## 2021-01-04 MED FILL — AMLODIPINE BESYLATE 5 MG TA: 5 | 30 days supply | Qty: 30 | Fill #0

## 2021-01-04 NOTE — Discharge Instructions (Signed)

## 2021-01-04 NOTE — Discharge Summary (Addendum)
Postpartum Discharge Summary      Patient Name: Nicole Case DOB: [birth year] MRN: [id number]  Date of admission: 12/30/2020 Delivery date:01/02/2021  Delivering provider: Janet Berlin  Date of discharge: 01/04/2021  Admitting diagnosis: Labor and delivery, indication for care [O75.9] Intrauterine pregnancy: [redacted]w[redacted]d    Secondary diagnosis:  Active Problems:   Sickle cell anemia with pain (HCC)   Sickle cell disease, type [place], with unspecified crisis ([place]   History of COVID-19   Gestational diabetes mellitus, diet-controlled   Decreased fetal movement during pregnancy in third trimester, antepartum   Gestational hypertension  Additional problems: none    Discharge diagnosis: Term Pregnancy Delivered                                              Post partum procedures:blood transfusion 1 unit pRBC on 1/31 Augmentation: AROM, Pitocin, Cytotec and IP Foley Complications: None  Hospital course: Induction of Labor With Vaginal Delivery   20y.o. yo G1P1001 at 354w1das admitted to the hospital 12/30/2020 for induction of labor.  Indication for induction: A1 DM.  Patient had an uncomplicated labor course as follows: Membrane Rupture Time/Date: 1:09 PM ,01/01/2021   Delivery Method:Vaginal, Spontaneous  Episiotomy: None  Lacerations:  1st degree;Labial;Periurethral  Details of delivery can be found in separate delivery note.  Patient was started on norvasc postpartum for new diagnosis of gestational htn. She also received 1 unit pRBC for a hgb drop from 8.5 to 6.2. She had an otherwise uncomplicated postpartum course. Patient is discharged home 01/04/21.  Newborn Data: Birth date:01/02/2021  Birth time:7:13 AM  Gender:Female  Living status:Living  Apgars:8 ,9  Weight:3135 g   Magnesium Sulfate received: No BMZ received: No Rhophylac:N/A MMR:N/A T-DaP: declined Flu: Yes Transfusion:Yes  Physical exam  Vitals:   01/03/21 0935 01/03/21 1200 01/04/21 0616 01/04/21 0620   BP: 107/64 129/70 (!) 140/99 124/86  Pulse: 99 99 (!) 101 95  Resp: 17 18 18 18   Temp: 98.4 F (36.9 C) 98.7 F (37.1 C) 98.4 F (36.9 C) 98.4 F (36.9 C)  TempSrc: Oral Oral Oral Oral  SpO2: 100% 99%  99%  Weight:      Height:       General: alert, cooperative and no distress Lochia: appropriate Uterine Fundus: firm Incision: N/A DVT Evaluation: No evidence of DVT seen on physical exam. Labs: Lab Results  Component Value Date   WBC 11.1 (H) 01/03/2021   HGB 7.6 (L) 01/03/2021   HCT 22.0 (L) 01/03/2021   MCV 75.6 (L) 01/03/2021   PLT 143 (L) 01/03/2021   CMP Latest Ref Rng & Units 01/02/2021  Glucose 70 - 99 mg/dL 105(H)  BUN 6 - 20 mg/dL 6  Creatinine 0.44 - 1.00 mg/dL 0.81  Sodium 135 - 145 mmol/L 136  Potassium 3.5 - 5.1 mmol/L 3.9  Chloride 98 - 111 mmol/L 104  CO2 22 - 32 mmol/L 22  Calcium 8.9 - 10.3 mg/dL 8.7(L)  Total Protein 6.5 - 8.1 g/dL 6.0(L)  Total Bilirubin 0.3 - 1.2 mg/dL 4.9(H)  Alkaline Phos 38 - 126 U/L 129(H)  AST 15 - 41 U/L 17  ALT 0 - 44 U/L 10   Edinburgh Score: Edinburgh Postnatal Depression Scale Screening Tool 01/03/2021  I have been able to laugh and see the funny side of things. 0  I have looked  forward with enjoyment to things. 0  I have blamed myself unnecessarily when things went wrong. 1  I have been anxious or worried for no good reason. 0  I have felt scared or panicky for no good reason. 0  Things have been getting on top of me. 0  I have been so unhappy that I have had difficulty sleeping. 0  I have felt sad or miserable. 0  I have been so unhappy that I have been crying. 0  The thought of harming myself has occurred to me. 0  Edinburgh Postnatal Depression Scale Total 1     After visit meds:  Allergies as of 01/04/2021   No Known Allergies     Medication List    STOP taking these medications   prenatal multivitamin Tabs tablet     TAKE these medications   Accu-Chek Guide test strip Generic drug: glucose  blood Use 1 test strip to check blood glucose 4 times daily   Accu-Chek Softclix Lancets lancets Use one lancet to check blood glucose 4 times daily.   albuterol 108 (90 Base) MCG/ACT inhaler Commonly known as: VENTOLIN HFA Inhale 2 puffs into the lungs every 6 (six) hours as needed for wheezing or shortness of breath.   amLODipine 5 MG tablet Commonly known as: NORVASC Take 1 tablet (5 mg total) by mouth daily.   Blood Pressure Kit Devi 1 kit by Does not apply route once a week. Check Blood Pressure regularly and record readings into the Babyscripts App.  Large Cuff.  DX O90.0   coconut oil Oil Apply 1 application topically as needed.   ibuprofen 800 MG tablet Commonly known as: ADVIL Take 1 tablet (800 mg total) by mouth every 8 (eight) hours as needed.        Discharge home in stable condition Infant Feeding: Bottle Infant Disposition:home with mother Discharge instruction: per After Visit Summary and Postpartum booklet. Activity: Advance as tolerated. Pelvic rest for 6 weeks.  Diet: routine diet Future Appointments:No future appointments. Follow up Visit:   Please schedule this patient for a In person postpartum visit in 6 weeks with the following provider: MD. Additional Postpartum F/U:2 hour GTT in 6 weeks and BP check 1 week  High risk pregnancy complicated by: GDM and HTN Delivery mode:  Vaginal, Spontaneous  Anticipated Birth Control:  Depo   01/04/2021 Janet Berlin, MD

## 2021-01-04 NOTE — Plan of Care (Signed)
Patient to be discharged with printed instructions. Nicole Case [name], RN  

## 2021-01-11 ENCOUNTER — Ambulatory Visit: Payer: Medicaid Other

## 2021-02-15 ENCOUNTER — Ambulatory Visit: Payer: Medicaid Other | Admitting: Obstetrics and Gynecology

## 2021-02-15 ENCOUNTER — Other Ambulatory Visit: Payer: Medicaid Other

## 2021-03-15 ENCOUNTER — Encounter: Payer: Medicaid Other | Admitting: Physical Therapy

## 2021-03-22 ENCOUNTER — Encounter: Payer: Medicaid Other | Admitting: Physical Therapy

## 2021-04-05 ENCOUNTER — Encounter: Payer: Medicaid Other | Admitting: Physical Therapy

## 2021-04-11 ENCOUNTER — Other Ambulatory Visit: Payer: Self-pay

## 2021-04-11 ENCOUNTER — Encounter (HOSPITAL_COMMUNITY): Payer: Self-pay | Admitting: *Deleted

## 2021-04-11 ENCOUNTER — Ambulatory Visit (HOSPITAL_COMMUNITY)
Admission: EM | Admit: 2021-04-11 | Discharge: 2021-04-11 | Disposition: A | Discharge status: Home / Self Care | Payer: Medicaid Other | Attending: Family Medicine | Admitting: Family Medicine

## 2021-04-11 DIAGNOSIS — M545 Low back pain, unspecified: Secondary | ICD-10-CM | POA: Diagnosis not present

## 2021-04-11 DIAGNOSIS — D5700 Hb-SS disease with crisis, unspecified: Secondary | ICD-10-CM

## 2021-04-11 DIAGNOSIS — R1084 Generalized abdominal pain: Secondary | ICD-10-CM | POA: Diagnosis not present

## 2021-04-11 LAB — POCT URINALYSIS DIPSTICK, ED / UC
Bilirubin Urine: NEGATIVE
Glucose, UA: NEGATIVE mg/dL
Ketones, ur: NEGATIVE mg/dL
Leukocytes,Ua: NEGATIVE
Nitrite: NEGATIVE
Protein, ur: NEGATIVE mg/dL
Specific Gravity, Urine: <=1.005 (ref 1.005–1.030)
Urobilinogen, UA: 0.2 mg/dL (ref 0.0–1.0)
pH: 5.5 (ref 5.0–8.0)

## 2021-04-11 LAB — POC URINE PREG, ED: Preg Test, Ur: NEGATIVE

## 2021-04-11 NOTE — ED Triage Notes (Signed)
Pt reports Sx;s started over a week ago. Low back pain ,ABD pain  With out vomiting some nausea.

## 2021-04-17 ENCOUNTER — Inpatient Hospital Stay (HOSPITAL_COMMUNITY)
Admission: AD | Admit: 2021-04-17 | Discharge: 2021-04-17 | Disposition: A | Discharge status: Home / Self Care | Payer: Medicaid Other | Attending: Obstetrics and Gynecology | Admitting: Obstetrics and Gynecology

## 2021-04-17 ENCOUNTER — Other Ambulatory Visit: Payer: Self-pay

## 2021-04-17 DIAGNOSIS — M545 Low back pain, unspecified: Secondary | ICD-10-CM | POA: Diagnosis not present

## 2021-04-17 DIAGNOSIS — M5442 Lumbago with sciatica, left side: Secondary | ICD-10-CM

## 2021-04-17 DIAGNOSIS — Z3202 Encounter for pregnancy test, result negative: Secondary | ICD-10-CM | POA: Insufficient documentation

## 2021-04-17 LAB — POCT PREGNANCY, URINE: Preg Test, Ur: NEGATIVE

## 2021-04-17 NOTE — MAU Provider Note (Signed)
[name] is a 20 y.o. G67P1001 female who presents to MAU today with complaint of LBP and missed period. She has not taken a pregnancy test.  ROS: +LBP  O BP 131/85 (BP Location: Right Arm)   Pulse (!) 102   Temp 98.8 F (37.1 C) (Oral)   Resp 15   LMP 03/04/2021   SpO2 100%  Physical Exam Vitals and nursing note reviewed.  Constitutional:      General: She is not in acute distress.    Appearance: Normal appearance.  HENT:     Head: Normocephalic and atraumatic.  Pulmonary:     Effort: Pulmonary effort is normal. No respiratory distress.  Musculoskeletal:        General: Normal range of motion.     Cervical back: Normal range of motion.  Neurological:     General: No focal deficit present.     Mental Status: She is alert and oriented to person, place, and time.  Psychiatric:        Mood and Affect: Mood normal.        Behavior: Behavior normal.    Results for orders placed or performed during the hospital encounter of 04/17/21 (from the past 24 hour(s))  Pregnancy, urine POC     Status: None   Collection Time: 04/17/21  5:50 PM  Result Value Ref Range   Preg Test, Ur NEGATIVE NEGATIVE   MDM: No signs of pregnancy. No acute condition identified. Recommend UC if she wants evaluation for back pain. Stable for discharge home.  A 1. Acute left-sided low back pain without sciatica   2. Negative pregnancy test     P Discharge from MAU in stable condition Warning signs for worsening condition that would warrant emergency follow-up discussed Patient may return to MAU as needed for pregnancy related complaints  Donette Larry, CNM 04/17/2021 6:02 PM

## 2021-04-17 NOTE — MAU Note (Signed)
Pt reports to mau with c/o missed period and abd pain that started 1 week ago.  Denies taking a hpt.  Preg test neg in MAU

## 2021-04-19 ENCOUNTER — Encounter: Payer: Medicaid Other | Admitting: Physical Therapy

## 2021-08-15 ENCOUNTER — Other Ambulatory Visit: Payer: Medicaid Other

## 2021-09-27 ENCOUNTER — Encounter (HOSPITAL_COMMUNITY): Payer: Self-pay | Admitting: Emergency Medicine

## 2021-09-27 ENCOUNTER — Ambulatory Visit (HOSPITAL_COMMUNITY)
Admission: EM | Admit: 2021-09-27 | Discharge: 2021-09-27 | Disposition: A | Discharge status: Home / Self Care | Payer: Medicaid Other | Attending: Emergency Medicine | Admitting: Emergency Medicine

## 2021-09-27 ENCOUNTER — Other Ambulatory Visit: Payer: Self-pay

## 2021-09-27 DIAGNOSIS — J36 Peritonsillar abscess: Secondary | ICD-10-CM | POA: Diagnosis not present

## 2021-09-27 DIAGNOSIS — J069 Acute upper respiratory infection, unspecified: Secondary | ICD-10-CM | POA: Diagnosis not present

## 2021-09-27 DIAGNOSIS — Q808 Other congenital ichthyosis: Secondary | ICD-10-CM | POA: Insufficient documentation

## 2021-09-27 LAB — POCT RAPID STREP A, ED / UC: Streptococcus, Group A Screen (Direct): NEGATIVE

## 2021-09-27 LAB — POC INFLUENZA A AND B ANTIGEN (URGENT CARE ONLY)
INFLUENZA A ANTIGEN, POC: NEGATIVE
INFLUENZA B ANTIGEN, POC: NEGATIVE

## 2021-09-27 MED ORDER — TRIAMCINOLONE ACETONIDE 0.1 % EX CREA: 1.0000 "application " | TOPICAL_CREAM | Freq: Two times a day (BID) | CUTANEOUS | 0 refills | Status: DC

## 2021-09-27 MED ORDER — AMOXICILLIN-POT CLAVULANATE 875-125 MG PO TABS: 1.0000 | ORAL_TABLET | Freq: Two times a day (BID) | ORAL | 0 refills | Status: AC

## 2021-09-27 NOTE — ED Triage Notes (Addendum)
Pt is present today with sore throat, HA, and chills. Pt states sx started one week ago. Pt also states she has been experiencing excessive skin peeling with both her feet that she noticed one week ago that are now causing painful blisters

## 2021-09-27 NOTE — Discharge Instructions (Signed)
For your throat:  Take the augmentin twice a day for the next 10 days.   You can take Tylenol and/or Ibuprofen as needed for fever reduction and pain relief.  For sore throat: try warm salt water gargles, cepacol lozenges, throat spray, warm tea or water with lemon/honey, popsicles or ice, or OTC cold relief medicine for throat discomfort.  It is important to stay hydrated: drink plenty of fluids (water, gatorade/powerade/pedialyte, juices, or teas) to keep your throat moisturized and help further relieve irritation/discomfort.   If your throat pain gets worse or you develop any difficulty breathing or swallowing, please go to the ED for further evaluation.    For cough and congestion:  For cough: honey 1/2 to 1 teaspoon (you can dilute the honey in water or another fluid).  You can also use guaifenesin and dextromethorphan for cough.   For congestion: take a daily anti-histamine like Zyrtec, Claritin, and a oral decongestant, such as pseudoephedrine.  You can also use Flonase 1-2 sprays in each nostril daily.   For your foot:  Apply the triamcinolone cream twice a day as needed for itching and peeling.  Avoid using hot water when showering or bathing as this can make the itching worse.  You can apply ice intermittently for comfort.  Use a good emollient lotion such as CeraVe, Cetaphil, or Aquaphor.  It is best to apply lotion to wet skin after bathing.    Return or go to the Emergency Department if symptoms worsen or do not improve in the next few days.

## 2021-09-27 NOTE — ED Provider Notes (Signed)
[place]    CSN: [id number] Arrival date & time: 09/27/21  1759      History   Chief Complaint Chief Complaint  Patient presents with   Sore Throat   Headache   Rash   Chills    HPI Nicole Case is a 20 y.o. female.   Patient here for evaluation of sore throat, headache, chills, and peeling feet that have been ongoing for the past week.  Reports recently around her cousin who was also ill.  Reports taking Tylenol with minimal symptom relief.  Denies any trauma, injury, or other precipitating event.  Denies any specific alleviating or aggravating factors.  Denies any fevers, chest pain, shortness of breath, N/V/D, numbness, tingling, weakness, abdominal pain, or headaches.    The history is provided by the patient.  Sore Throat Associated symptoms include headaches.  Headache Associated symptoms: fever and sore throat   Rash Associated symptoms: fever, headaches and sore throat    Past Medical History:  Diagnosis Date   Anxiety    Gestational diabetes    Sickle cell anemia ([place])     Patient Active Problem List   Diagnosis Date Noted   Gestational hypertension 01/04/2021   Decreased fetal movement during pregnancy in third trimester, antepartum 12/31/2020   GBS (group B Streptococcus carrier), +RV culture, currently pregnant 12/27/2020   History of COVID-19 11/24/2020   Gestational diabetes mellitus, diet-controlled 11/24/2020   Thrombocytopenia affecting pregnancy ([place]) 10/21/2020   Hypokalemia 10/20/2020   Normal IUP (intrauterine pregnancy) on prenatal ultrasound, third trimester 10/20/2020   Pneumonia due to COVID-19 virus 10/19/2020   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   Carrier of genetic disorder 07/10/2020   Pap 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 07/06/2020   Trichomonal vaginitis during pregnancy in first trimester 07/03/2020   Anemia 07/03/2020   Maternal morbid obesity, antepartum ([place]) 06/29/2020   Supervision of  high risk pregnancy, antepartum 06/28/2020   Central chest pain    Sickle cell anemia with crisis ([place]) 06/20/2020   Sore throat 10/06/2019   Fever 10/06/2019   Urinary tract infection symptoms 10/06/2019   Sickle-cell disease with pain ([place]) 08/01/2017   Sickle cell disease, type [place], with unspecified crisis ([place]) 07/31/2017   Sickle cell anemia with 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 Authorizing Provider  amoxicillin-clavulanate (AUGMENTIN) 875-125 MG tablet Take 1 tablet by mouth every 12 (twelve) hours for 10 days. 09/27/21 10/07/21 Yes Pearson Forster, NP  triamcinolone cream (KENALOG) 0.1 % Apply 1 application topically 2 (two) times daily. 09/27/21  Yes Pearson Forster, NP  Accu-Chek Softclix Lancets lancets Use one lancet to check blood glucose 4 times daily.  12/24/20   Constant, Peggy, MD  albuterol (VENTOLIN HFA) 108 (90 Base) MCG/ACT inhaler Inhale 2 puffs into the lungs every 6 (six) hours as needed for wheezing or shortness of breath.    [provider]  amLODipine (NORVASC) 5 MG tablet TAKE 1 TABLET (5 MG TOTAL) BY MOUTH DAILY. 01/04/21 01/04/22  Shary Key, DO  Blood Pressure Monitoring (BLOOD PRESSURE KIT) DEVI 1 kit by Does not apply route once a week. Check Blood Pressure regularly and record readings into the Bab[employer] App.  Large Cuff.  DX O90.0 Patient not taking: No sig reported 09/21/20   Cephas Darby, MD  coconut oil OIL Apply 1 application topically  as needed. 01/04/21   Shary Key, DO  glucose blood (ACCU-CHEK GUIDE) test strip Use 1 test strip to check blood glucose 4 times daily 12/24/20   Constant, Peggy, MD  ibuprofen (ADVIL) 800 MG tablet TAKE 1 TABLET (800 MG TOTAL) BY MOUTH EVERY EIGHT HOURS AS NEEDED. 01/04/21 01/04/22  Shary Key, DO    Family History Family History  Problem Relation Age of Onset   Sickle cell anemia Brother     Social History Social History   Tobacco Use   Smoking status: Never   Smokeless tobacco: Never  Vaping Use   Vaping Use: Never used  Substance Use Topics   Alcohol use: No   Drug use: Not Currently    Types: Marijuana     Allergies   Patient has no known allergies.   Review of Systems Review of Systems  Constitutional:  Positive for chills and fever.  HENT:  Positive for sore throat.   Skin:  Positive for rash.  Neurological:  Positive for headaches.  All other systems reviewed and are negative.   Physical Exam Triage Vital Signs ED Triage Vitals  Enc Vitals Group     BP 09/27/21 1832 131/68     Pulse Rate 09/27/21 1832 92     Resp 09/27/21 1832 18     Temp 09/27/21 1835 (!) 97.2 F (36.2 C)     Temp src --      SpO2 09/27/21 1832 100 %     Weight --      Height --      Head Circumference --      Peak Flow --      Pain Score 09/27/21 1833 5     Pain Loc --      Pain Edu? --      Excl. in [place]? --    No data found.  Updated Vital Signs BP 131/68   Pulse 92   Temp (!) 97.2 F (36.2 C)   Resp 18   SpO2 100%   Breastfeeding No   Visual Acuity Right Eye Distance:   Left Eye Distance:   Bilateral Distance:    Right Eye Near:   Left Eye Near:    Bilateral Near:     Physical Exam Vitals and nursing note reviewed.  Constitutional:      General: She is not in acute distress.    Appearance: Normal appearance. She is not ill-appearing, toxic-appearing or diaphoretic.  HENT:     Head: Normocephalic and atraumatic.     Nose: No congestion.      Mouth/Throat:     Mouth: Mucous membranes are moist.     Pharynx: Uvula midline. Pharyngeal swelling and posterior oropharyngeal erythema present.     Tonsils: Tonsillar exudate present. 1+ on the right. 2+ on  the left.  Eyes:     Conjunctiva/sclera: Conjunctivae normal.  Cardiovascular:     Rate and Rhythm: Normal rate and regular rhythm.     Pulses: Normal pulses.     Heart sounds: Normal heart sounds.  Pulmonary:     Effort: Pulmonary effort is normal.     Breath sounds: Normal breath sounds.  Abdominal:     General: Abdomen is flat.  Musculoskeletal:        General: Normal range of motion.     Cervical back: Normal range of motion.  Skin:    General: Skin is warm and dry.     Findings: Rash present. Rash is scaling (flaking and scaling to bottom of left foot).  Neurological:     General: No focal deficit present.     Mental Status: She is alert and oriented to person, place, and time.  Psychiatric:        Mood and Affect: Mood normal.      UC Treatments / Results  Labs (all labs ordered are listed, but only abnormal results are displayed) Labs Reviewed  CULTURE, GROUP A STREP [facility])  POCT RAPID STREP A, ED / UC  POC INFLUENZA A AND B ANTIGEN (URGENT CARE ONLY)    EKG   Radiology No results found.  Procedures Procedures (including critical care time)  Medications Ordered in UC Medications - No data to display  Initial Impression / Assessment and Plan / UC Course  I have reviewed the triage vital signs and the nursing notes.  Pertinent labs & imaging results that were available during my care of the patient were reviewed by me and considered in my medical decision making (see chart for details).    Assessment negative for red flags or concerns.  Possible peritonsillar abscess but patient with no airway compromise or difficulty swallowing.  Rapid strep negative, throat culture pending.  We will treat with Augmentin twice daily for the next 10 days.  May take  Tylenol and/or ibuprofen as needed.  Discussed conservative symptom management as described in discharge instructions.  Encourage fluids and rest.  Strict ED follow-up for any worsening symptoms. Upper respiratory tract infection.  Flu swab negative.  This is likely a viral illness.  Discussed conservative symptom management as described in discharge instructions. Peeling skin syndrome of the foot.  May use triamcinolone cream twice a day as needed.  Recommend avoiding hot water when showering or bathing.  May apply ice or cool compresses for comfort.  Recommend good emollient cream such as CeraVe, Cetaphil, or Aquaphor and apply lotion to wet skin. Follow-up for reevaluation as needed. Final Clinical Impressions(s) / UC Diagnoses   Final diagnoses:  Peritonsillar abscess  Upper respiratory tract infection, unspecified type  Peeling skin syndrome     Discharge Instructions      For your throat:  Take the augmentin twice a day for the next 10 days.   You can take Tylenol and/or Ibuprofen as needed for fever reduction and pain relief.  For sore throat: try warm salt water gargles, cepacol lozenges, throat spray, warm tea or water with lemon/honey, popsicles or ice, or OTC cold relief medicine for throat discomfort.  It is important to stay hydrated: drink plenty of fluids (water, gatorade/powerade/pedialyte, juices, or teas) to keep your throat moisturized and help further relieve irritation/discomfort.   If your throat pain gets worse or you develop any difficulty breathing or swallowing, please go to the ED for further evaluation.    For cough  and congestion:  For cough: honey 1/2 to 1 teaspoon (you can dilute the honey in water or another fluid).  You can also use guaifenesin and dextromethorphan for cough.   For congestion: take a daily anti-histamine like Zyrtec, Claritin, and a oral decongestant, such as pseudoephedrine.  You can also use Flonase 1-2 sprays in each nostril daily.    For your foot:  Apply the triamcinolone cream twice a day as needed for itching and peeling.  Avoid using hot water when showering or bathing as this can make the itching worse.  You can apply ice intermittently for comfort.  Use a good emollient lotion such as CeraVe, Cetaphil, or Aquaphor.  It is best to apply lotion to wet skin after bathing.    Return or go to the Emergency Department if symptoms worsen or do not improve in the next few days.      ED Prescriptions     Medication Sig Dispense Auth. Provider   triamcinolone cream (KENALOG) 0.1 % Apply 1 application topically 2 (two) times daily. 30 g Pearson Forster, NP   amoxicillin-clavulanate (AUGMENTIN) 875-125 MG tablet Take 1 tablet by mouth every 12 (twelve) hours for 10 days. 20 tablet Pearson Forster, NP      PDMP not reviewed this encounter.   Pearson Forster, NP 09/27/21 2032

## 2021-09-30 LAB — CULTURE, GROUP A STREP (THRC)

## 2021-10-21 ENCOUNTER — Other Ambulatory Visit: Payer: Medicaid Other

## 2021-10-28 ENCOUNTER — Other Ambulatory Visit: Payer: Self-pay

## 2021-10-28 ENCOUNTER — Encounter (HOSPITAL_COMMUNITY): Payer: Self-pay | Admitting: Emergency Medicine

## 2021-10-28 ENCOUNTER — Ambulatory Visit (HOSPITAL_COMMUNITY)
Admission: EM | Admit: 2021-10-28 | Discharge: 2021-10-28 | Disposition: A | Discharge status: Home / Self Care | Payer: Medicaid Other | Attending: Emergency Medicine | Admitting: Emergency Medicine

## 2021-10-28 ENCOUNTER — Emergency Department (HOSPITAL_COMMUNITY)
Admission: EM | Admit: 2021-10-28 | Discharge: 2021-10-29 | Disposition: A | Discharge status: Home / Self Care | Payer: Medicaid Other | Attending: Emergency Medicine | Admitting: Emergency Medicine

## 2021-10-28 DIAGNOSIS — Z20822 Contact with and (suspected) exposure to covid-19: Secondary | ICD-10-CM | POA: Insufficient documentation

## 2021-10-28 DIAGNOSIS — Z8616 Personal history of COVID-19: Secondary | ICD-10-CM | POA: Insufficient documentation

## 2021-10-28 DIAGNOSIS — K429 Umbilical hernia without obstruction or gangrene: Secondary | ICD-10-CM | POA: Insufficient documentation

## 2021-10-28 DIAGNOSIS — R1110 Vomiting, unspecified: Secondary | ICD-10-CM | POA: Diagnosis not present

## 2021-10-28 DIAGNOSIS — R0602 Shortness of breath: Secondary | ICD-10-CM | POA: Insufficient documentation

## 2021-10-28 DIAGNOSIS — R112 Nausea with vomiting, unspecified: Secondary | ICD-10-CM

## 2021-10-28 DIAGNOSIS — E860 Dehydration: Secondary | ICD-10-CM | POA: Diagnosis not present

## 2021-10-28 DIAGNOSIS — R1084 Generalized abdominal pain: Secondary | ICD-10-CM

## 2021-10-28 DIAGNOSIS — R109 Unspecified abdominal pain: Secondary | ICD-10-CM | POA: Diagnosis not present

## 2021-10-28 DIAGNOSIS — Z79899 Other long term (current) drug therapy: Secondary | ICD-10-CM | POA: Insufficient documentation

## 2021-10-28 DIAGNOSIS — R161 Splenomegaly, not elsewhere classified: Secondary | ICD-10-CM | POA: Insufficient documentation

## 2021-10-28 DIAGNOSIS — R1033 Periumbilical pain: Secondary | ICD-10-CM | POA: Diagnosis not present

## 2021-10-28 DIAGNOSIS — R197 Diarrhea, unspecified: Secondary | ICD-10-CM | POA: Diagnosis not present

## 2021-10-28 LAB — POCT URINALYSIS DIPSTICK, ED / UC
Bilirubin Urine: NEGATIVE
Glucose, UA: NEGATIVE mg/dL
Hgb urine dipstick: NEGATIVE
Ketones, ur: NEGATIVE mg/dL
Leukocytes,Ua: NEGATIVE
Nitrite: NEGATIVE
Protein, ur: NEGATIVE mg/dL
Specific Gravity, Urine: 1.015 (ref 1.005–1.030)
Urobilinogen, UA: 0.2 mg/dL (ref 0.0–1.0)
pH: 7 (ref 5.0–8.0)

## 2021-10-28 LAB — COMPREHENSIVE METABOLIC PANEL
ALT: 33 U/L (ref 0–44)
AST: 27 U/L (ref 15–41)
Albumin: 4.1 g/dL (ref 3.5–5.0)
Alkaline Phosphatase: 69 U/L (ref 38–126)
Anion gap: 9 (ref 5–15)
BUN: 11 mg/dL (ref 6–20)
CO2: 24 mmol/L (ref 22–32)
Calcium: 8.8 mg/dL — ABNORMAL LOW (ref 8.9–10.3)
Chloride: 104 mmol/L (ref 98–111)
Creatinine, Ser: 0.77 mg/dL (ref 0.44–1.00)
GFR, Estimated: >60 mL/min (ref >60)
Glucose, Bld: 126 mg/dL — ABNORMAL HIGH (ref 70–99)
Potassium: 3.8 mmol/L (ref 3.5–5.1)
Sodium: 137 mmol/L (ref 135–145)
Total Bilirubin: 2.2 mg/dL — ABNORMAL HIGH (ref 0.3–1.2)
Total Protein: 7.8 g/dL (ref 6.5–8.1)

## 2021-10-28 LAB — URINALYSIS, ROUTINE W REFLEX MICROSCOPIC
Bilirubin Urine: NEGATIVE
Glucose, UA: NEGATIVE mg/dL
Hgb urine dipstick: NEGATIVE
Ketones, ur: NEGATIVE mg/dL
Leukocytes,Ua: NEGATIVE
Nitrite: NEGATIVE
Protein, ur: NEGATIVE mg/dL
Specific Gravity, Urine: 1.013 (ref 1.005–1.030)
pH: 5 (ref 5.0–8.0)

## 2021-10-28 LAB — CBC WITH DIFFERENTIAL/PLATELET
Abs Immature Granulocytes: 0.02 10*3/uL (ref 0.00–0.07)
Basophils Absolute: 0 10*3/uL (ref 0.0–0.1)
Basophils Relative: 0 %
Eosinophils Absolute: 0.1 10*3/uL (ref 0.0–0.5)
Eosinophils Relative: 1 %
HCT: 35.2 % — ABNORMAL LOW (ref 36.0–46.0)
Hemoglobin: 11.8 g/dL — ABNORMAL LOW (ref 12.0–15.0)
Immature Granulocytes: 0 %
Lymphocytes Relative: 7 %
Lymphs Abs: 0.8 10*3/uL (ref 0.7–4.0)
MCH: 22.6 pg — ABNORMAL LOW (ref 26.0–34.0)
MCHC: 33.5 g/dL (ref 30.0–36.0)
MCV: 67.6 fL — ABNORMAL LOW (ref 80.0–100.0)
Monocytes Absolute: 0.7 10*3/uL (ref 0.1–1.0)
Monocytes Relative: 6 %
Neutro Abs: 9.6 10*3/uL — ABNORMAL HIGH (ref 1.7–7.7)
Neutrophils Relative %: 86 %
Platelets: 189 10*3/uL (ref 150–400)
RBC: 5.21 MIL/uL — ABNORMAL HIGH (ref 3.87–5.11)
RDW: 20.8 % — ABNORMAL HIGH (ref 11.5–15.5)
WBC: 11.2 10*3/uL — ABNORMAL HIGH (ref 4.0–10.5)
nRBC: 0 % (ref 0.0–0.2)

## 2021-10-28 LAB — PREGNANCY, URINE: Preg Test, Ur: NEGATIVE

## 2021-10-28 LAB — POC URINE PREG, ED: Preg Test, Ur: NEGATIVE

## 2021-10-28 LAB — RESP PANEL BY RT-PCR (FLU A&B, COVID) ARPGX2
Influenza A by PCR: NEGATIVE
Influenza B by PCR: NEGATIVE
SARS Coronavirus 2 by RT PCR: NEGATIVE

## 2021-10-28 LAB — LIPASE, BLOOD: Lipase: 21 U/L (ref 11–51)

## 2021-10-28 MED ORDER — ONDANSETRON 4 MG PO TBDP
ORAL_TABLET | ORAL | Status: AC
  Filled 2021-10-28: qty 1

## 2021-10-28 MED ORDER — LACTATED RINGERS IV BOLUS
2000.0000 mL | Freq: Once | INTRAVENOUS | Status: AC
  Administered 2021-10-29: 2000 mL via INTRAVENOUS

## 2021-10-28 MED ORDER — ONDANSETRON HCL 4 MG/2ML IJ SOLN
4.0000 mg | Freq: Once | INTRAMUSCULAR | Status: DC
  Filled 2021-10-28: qty 2

## 2021-10-28 MED ORDER — ONDANSETRON 4 MG PO TBDP
8.0000 mg | ORAL_TABLET | Freq: Once | ORAL | Status: AC
  Administered 2021-10-28: 8 mg via ORAL

## 2021-10-28 MED ORDER — FENTANYL CITRATE PF 50 MCG/ML IJ SOSY
100.0000 ug | PREFILLED_SYRINGE | Freq: Once | INTRAMUSCULAR | Status: AC
  Administered 2021-10-29: 100 ug via INTRAVENOUS
  Filled 2021-10-28: qty 2

## 2021-10-28 NOTE — ED Notes (Signed)
Patient is being discharged from the Urgent Care and sent to the Emergency Department via POV . Per Dr Chaney Malling, patient is in need of higher level of care due to abdominal pain, vomiting diarrhea. Patient is aware and verbalizes understanding of plan of care.  Vitals:   10/28/21 1855  BP: 124/88  Pulse: (!) 131  Resp: 19  Temp: 98.3 F (36.8 C)  SpO2: 98%

## 2021-10-28 NOTE — ED Provider Notes (Signed)
HPI  SUBJECTIVE:  Nicole Case is a 20 y.o. female who presents with nonmigratory, nonradiating constant, sharp, periumbilical pain, over 10 episodes of watery, nonbloody diarrhea and nonbilious, nonbloody emesis today.  Cannot remember how many times she threw up or had diarrhea yesterday.  She reports shortness of breath with exertion.  No fevers, body aches, headachesa, nasal congestion, rhinorrhea, sore throat, cough, chest pain.  No abdominal distention although she states that she has a "hard bulge" in the middle of her abdomen.  No urinary, vaginal complaints, pelvic pain.  No recent antibiotics, raw or undercooked foods, questionable leftovers, contacts with similar symptoms.  No COVID or flu exposure.  She did not get the COVID-vaccine.  She got the flu vaccine.  She had a negative rapid home COVID test today.  No alcohol last night, no recent excess NSAID use.  She states that the car ride over here was not painful.  She states that she is unable to tolerate any p.o. whatsoever.  She denies change in urine output.  She has a past medical history of sickle cell disease, gestational diabetes, gestational hypertension, COVID-pneumonia, trichomonas.  No history of pancreatitis, abdominal surgeries, gallbladder disease.  LMP: 10/19.  She is not sure if she could be pregnant.  PMD: [facility]    Past Medical History:  Diagnosis Date   Anxiety    Gestational diabetes    Sickle cell anemia (H[facility])     Past Surgical History:  Procedure Laterality Date   NO PAST SURGERIES      Family History  Problem Relation Age of Onset   Sickle cell anemia Brother     Social History   Tobacco Use   Smoking status: Never   Smokeless tobacco: Never  Vaping Use   Vaping Use: Never used  Substance Use Topics   Alcohol use: No   Drug use: Not Currently    Types: Marijuana     Current Facility-Administered Medications:    ondansetron (ZOFRAN-ODT) disintegrating tablet 8  mg, 8 mg, Oral, Once, Melynda Ripple, MD  Current Outpatient Medications:    Accu-Chek Softclix Lancets lancets, Use one lancet to check blood glucose 4 times daily., Disp: 100 each, Rfl: 12   albuterol (VENTOLIN HFA) 108 (90 Base) MCG/ACT inhaler, Inhale 2 puffs into the lungs every 6 (six) hours as needed for wheezing or shortness of breath., Disp: , Rfl:    amLODipine (NORVASC) 5 MG tablet, TAKE 1 TABLET (5 MG TOTAL) BY MOUTH DAILY., Disp: 30 tablet, Rfl: 0   Blood Pressure Monitoring (BLOOD PRESSURE KIT) DEVI, 1 kit by Does not apply route once a week. Check Blood Pressure regularly and record readings into the Babys[employer] App.  Large Cuff.  DX O90.0 (Patient not taking: No sig reported), Disp: 1 each, Rfl: 0   coconut oil OIL, Apply 1 application topically as needed., Disp: , Rfl: 0   glucose blood (ACCU-CHEK GUIDE) test strip, Use 1 test strip to check blood glucose 4 times daily, Disp: 100 each, Rfl: 4   ibuprofen (ADVIL) 800 MG tablet, TAKE 1 TABLET (800 MG TOTAL) BY MOUTH EVERY EIGHT HOURS AS NEEDED., Disp: 30 tablet, Rfl: 0   triamcinolone cream (KENALOG) 0.1 %, Apply 1 application topically 2 (two) times daily., Disp: 30 g, Rfl: 0  No Known Allergies   ROS  As noted in HPI.   Physical Exam  BP 124/88 (BP Location: Left Arm)   Pulse (!) 131   Temp 98.3 F (  36.8 C) (Oral)   Resp 19   SpO2 98%   Constitutional: Well developed, well nourished, no acute distress Eyes:  EOMI, conjunctiva normal bilaterally HENT: Normocephalic, atraumatic,mucus membranes moist Respiratory: Normal inspiratory effort, lungs clear bilaterally Cardiovascular: Regular tachycardia, no murmurs rubs or gallops GI: Normal appearance, nondistended.  Positive periumbilical left lower quadrant, left flank tenderness.  Negative Murphy, negative McBurney.  No guarding, rebound.  Active bowel sounds. Back: Positive right CVAT skin: No rash, skin intact Musculoskeletal: no deformities Neurologic: Alert &  oriented x 3, no focal neuro deficits Psychiatric: Speech and behavior appropriate   ED Course   Medications  ondansetron (ZOFRAN-ODT) disintegrating tablet 8 mg (has no administration in time range)    Orders Placed This Encounter  Procedures   POC urine pregnancy    Standing Status:   Standing    Number of Occurrences:   1   POC Urinalysis dipstick    Standing Status:   Standing    Number of Occurrences:   1    Results for orders placed or performed during the hospital encounter of 10/28/21 (from the past 24 hour(s))  POC Urinalysis dipstick     Status: None   Collection Time: 10/28/21  7:12 PM  Result Value Ref Range   Glucose, UA NEGATIVE NEGATIVE mg/dL   Bilirubin Urine NEGATIVE NEGATIVE   Ketones, ur NEGATIVE NEGATIVE mg/dL   Specific Gravity, Urine 1.015 1.005 - 1.030   Hgb urine dipstick NEGATIVE NEGATIVE   pH 7.0 5.0 - 8.0   Protein, ur NEGATIVE NEGATIVE mg/dL   Urobilinogen, UA 0.2 0.0 - 1.0 mg/dL   Nitrite NEGATIVE NEGATIVE   Leukocytes,Ua NEGATIVE NEGATIVE  POC urine pregnancy     Status: None   Collection Time: 10/28/21  7:19 PM  Result Value Ref Range   Preg Test, Ur NEGATIVE NEGATIVE   No results found.  ED Clinical Impression  1. Periumbilical abdominal pain   2. Nausea vomiting and diarrhea   3. Dehydration      ED Assessment/Plan  Patient tachycardic, has periumbilical tenderness.  No rebound or guarding.  She is not pregnant, does not have a UTI.  Could be simple gastroenteritis, however, feel the patient would benefit from some IV fluids, labs, and further evaluation to rule out an emergency. giving Zofran 8 mg here.  Advised patient be n.p.o. until ER evaluation is complete.  She is stable to go by private vehicle.  Discussed medical decision making, rationale for transfer to the emergency department with patient.  She agrees with plan.  Meds ordered this encounter  Medications   ondansetron (ZOFRAN-ODT) disintegrating tablet 8 mg     *This clinic note was created using Lobbyist. Therefore, there may be occasional mistakes despite careful proofreading.  ?    Melynda Ripple, MD 10/28/21 [name]

## 2021-10-28 NOTE — Discharge Instructions (Addendum)
I am concerned that you are dehydrated enough that you need IV fluids, labs, and further evaluation to make sure that you are not having an emergency.  I am giving you 8 mg of Zofran here.  Do not have anything to eat or drink until your ER evaluation is complete.

## 2021-10-28 NOTE — ED Triage Notes (Signed)
Pt c/o abd pain, diarrhea, and vomiting since yesterday.

## 2021-10-28 NOTE — ED Provider Notes (Signed)
Emergency Medicine Provider Triage Evaluation Note  SHEARON Case , a 20 y.o. female  was evaluated in triage.  Pt complains of periumbilical and right lower belly pain since yesterday with antacids of NBNB emesis and 7 episodes of watery diarrhea today without hematochezia.  Vaccinated for the flu.  Afebrile at home.  Review of Systems  Positive: Abdominal pain, nausea, vomiting, shortness of breath Negative: Chest pain, palpitations, fevers, chills  Physical Exam  BP (!) 141/107 (BP Location: Right Arm)   Pulse (!) 135   Temp 98.1 F (36.7 C) (Oral)   Resp 20   Ht 5\' 7"  (1.702 m)   Wt 88.5 kg   SpO2 100%   BMI 30.54 kg/m  Gen:   Awake, tearful, ill-appearing Resp:  Normal effort  MSK:   Moves extremities without difficulty  Other:  Abdomen tender in the periumbilical and right lower quadrants without rebound or guarding.  Some right-sided CVAT. Lungs CTAB. Tachycardic to the 130s with RRR, no M/R/G  Medical Decision Making  Medically screening exam initiated at 8:34 PM.  Appropriate orders placed.  [name] was informed that the remainder of the evaluation will be completed by another provider, this initial triage assessment does not replace that evaluation, and the importance of remaining in the ED until their evaluation is complete.  This chart was dictated using voice recognition software, Dragon. Despite the best efforts of this provider to proofread and correct errors, errors may still occur which can change documentation meaning.    10/28/21 2042    2043, MD 10/28/21 2214

## 2021-10-28 NOTE — ED Triage Notes (Signed)
Pt c/o mid abd pains with n/v/d since yesterday.  Rash on RLE came up yesterday that itches.

## 2021-10-29 ENCOUNTER — Emergency Department (HOSPITAL_COMMUNITY): Payer: Medicaid Other

## 2021-10-29 DIAGNOSIS — R109 Unspecified abdominal pain: Secondary | ICD-10-CM | POA: Diagnosis not present

## 2021-10-29 DIAGNOSIS — R1110 Vomiting, unspecified: Secondary | ICD-10-CM | POA: Diagnosis not present

## 2021-10-29 MED ORDER — IOPAMIDOL (ISOVUE-370) INJECTION 76%
75.0000 mL | Freq: Once | INTRAVENOUS | Status: AC | PRN
  Administered 2021-10-29: 75 mL via INTRAVENOUS

## 2021-10-29 NOTE — ED Provider Notes (Signed)
[facility] EMERGENCY DEPARTMENT Provider Note   CSN: [id number] Arrival date & time: 10/28/21  1933     History Chief Complaint  Patient presents with   Abdominal Pain   Emesis   Diarrhea    Nicole Case is a 20 y.o. female.  The history is provided by the patient.  Abdominal Pain Pain location:  Generalized Pain quality: cramping   Pain radiates to:  Does not radiate Pain severity:  Mild Onset quality:  Gradual Duration:  1 day Timing:  Intermittent Progression:  Unchanged Chronicity:  New Context: not alcohol use   Relieved by:  Nothing Worsened by:  Nothing Associated symptoms: diarrhea, nausea, shortness of breath and vomiting   Associated symptoms: no chest pain, no cough, no fever, no hematemesis, no hematochezia and no melena   Emesis Associated symptoms: abdominal pain and diarrhea   Associated symptoms: no cough and no fever   Diarrhea Associated symptoms: abdominal pain and vomiting   Associated symptoms: no fever   Patient with history of sickle cell disease, anxiety presents with abdominal pain, vomiting and diarrhea. She reports she had a home-cooked meal for Thanksgiving over a day ago, and then began having diffuse abdominal pain with vomiting and diarrhea.  No blood in the vomit or stool. She initially went to urgent care and was sent here    Past Medical History:  Diagnosis Date   Anxiety    Gestational diabetes    Sickle cell anemia ([place])     Patient Active Problem List   Diagnosis Date Noted   Gestational hypertension 01/04/2021   Decreased fetal movement during pregnancy in third trimester, antepartum 12/31/2020   GBS (group B Streptococcus carrier), +RV culture, currently pregnant 12/27/2020   History of COVID-19 11/24/2020   Gestational diabetes mellitus, diet-controlled 11/24/2020   Thrombocytopenia affecting pregnancy ([place]) 10/21/2020   Hypokalemia 10/20/2020   Normal IUP (intrauterine pregnancy) on prenatal  ultrasound, third trimester 10/20/2020   Pneumonia due to COVID-19 virus 10/19/2020   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   Carrier of genetic disorder 07/10/2020   Pap 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 07/06/2020   Trichomonal vaginitis during pregnancy in first trimester 07/03/2020   Anemia 07/03/2020   Maternal morbid obesity, antepartum ([place]) 06/29/2020   Supervision of high risk pregnancy, antepartum 06/28/2020   Central chest pain    Sickle cell anemia with crisis ([place]) 06/20/2020   Sore throat 10/06/2019   Fever 10/06/2019   Urinary tract infection symptoms 10/06/2019   Sickle-cell disease with pain ([place]) 08/01/2017   Sickle cell disease, type [place], with unspecified crisis ([place]) 07/31/2017   Sickle cell anemia with pain ([place]) 05/28/2016   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   Sickle cell pain crisis ([place]) 01/29/2014   Community acquired pneumonia 12/12/2011   Sickle cell pain crisis 12/12/2011    Past Surgical History:  Procedure Laterality Date   NO PAST SURGERIES       OB History     Gravida  1   Para  1   Term  1   Preterm      AB      Living  1      SAB      IAB      Ectopic      Multiple  0   Live Births  1           Family History  Problem Relation Age of Onset   Sickle cell anemia Brother  Social History   Tobacco Use   Smoking status: Never   Smokeless tobacco: Never  Vaping Use   Vaping Use: Never used  Substance Use Topics   Alcohol use: No   Drug use: Not Currently    Types: Marijuana    Home Medications Prior to Admission medications   Medication Sig Start Date End Date Taking? Authorizing Provider  naproxen sodium (ALEVE) 220 MG tablet Take 440 mg by mouth daily as needed (pain).   Yes [provider]  Accu-Chek Softclix Lancets lancets Use one lancet to check blood glucose 4 times daily. Patient not taking: Reported on 10/29/2021 12/24/20   Constant, Peggy, MD  albuterol (VENTOLIN HFA) 108  (90 Base) MCG/ACT inhaler Inhale 2 puffs into the lungs every 6 (six) hours as needed for wheezing or shortness of breath. Patient not taking: Reported on 10/29/2021    [provider]  amLODipine (NORVASC) 5 MG tablet TAKE 1 TABLET (5 MG TOTAL) BY MOUTH DAILY. Patient not taking: Reported on 10/29/2021 01/04/21 01/04/22  Shary Key, DO  Blood Pressure Monitoring (BLOOD PRESSURE KIT) DEVI 1 kit by Does not apply route once a week. Check Blood Pressure regularly and record readings into the Babys[employer]s App.  Large Cuff.  DX O90.0 Patient not taking: Reported on 09/23/2020 09/21/20   Cephas Darby, MD  coconut oil OIL Apply 1 application topically as needed. Patient not taking: Reported on 10/29/2021 01/04/21   Shary Key, DO  glucose blood (ACCU-CHEK GUIDE) test strip Use 1 test strip to check blood glucose 4 times daily Patient not taking: Reported on 10/29/2021 12/24/20   Constant, Peggy, MD  ibuprofen (ADVIL) 800 MG tablet TAKE 1 TABLET (800 MG TOTAL) BY MOUTH EVERY EIGHT HOURS AS NEEDED. Patient not taking: Reported on 10/29/2021 01/04/21 01/04/22  Shary Key, DO  triamcinolone cream (KENALOG) 0.1 % Apply 1 application topically 2 (two) times daily. Patient not taking: Reported on 10/29/2021 09/27/21   Pearson Forster, NP    Allergies    Patient has no known allergies.  Review of Systems   Review of Systems  Constitutional:  Negative for fever.  Respiratory:  Positive for shortness of breath. Negative for cough.   Cardiovascular:  Negative for chest pain.  Gastrointestinal:  Positive for abdominal pain, diarrhea, nausea and vomiting. Negative for blood in stool, hematemesis, hematochezia and melena.  All other systems reviewed and are negative.  Physical Exam Updated Vital Signs BP 104/67 (BP Location: Left Arm)   Pulse (!) 116   Temp 98.1 F (36.7 C) (Oral)   Resp 18   Ht 1.702 m (5' 7")   Wt 88.5 kg   SpO2 100%   BMI 30.54 kg/m   Physical  Exam CONSTITUTIONAL: Well developed/well nourished, no acute distress HEAD: Normocephalic/atraumatic EYES: EOMI/PERRL NECK: supple no meningeal signs SPINE/BACK:entire spine nontender CV: S1/S2 noted, tachycardic LUNGS: Lungs are clear to auscultation bilaterally, no apparent distress ABDOMEN: soft, diffuse mild tenderness, no rebound or guarding, bowel sounds noted throughout abdomen GU:no cva tenderness NEURO: Pt is awake/alert/appropriate, moves all extremitiesx4.  No facial droop.   EXTREMITIES: pulses normal/equal, full ROM SKIN: warm, color normal PSYCH: no abnormalities of mood noted, alert and oriented to situation  ED Results / Procedures / Treatments   Labs (all labs ordered are listed, but only abnormal results are displayed) Labs Reviewed  CBC WITH DIFFERENTIAL/PLATELET - Abnormal; Notable for the following components:      Result Value   WBC 11.2 (*)  RBC 5.21 (*)    Hemoglobin 11.8 (*)    HCT 35.2 (*)    MCV 67.6 (*)    MCH 22.6 (*)    RDW 20.8 (*)    Neutro Abs 9.6 (*)    All other components within normal limits  COMPREHENSIVE METABOLIC PANEL - Abnormal; Notable for the following components:   Glucose, Bld 126 (*)    Calcium 8.8 (*)    Total Bilirubin 2.2 (*)    All other components within normal limits  URINALYSIS, ROUTINE W REFLEX MICROSCOPIC - Abnormal; Notable for the following components:   APPearance HAZY (*)    All other components within normal limits  RESP PANEL BY RT-PCR (FLU A&B, COVID) ARPGX2  LIPASE, BLOOD  PREGNANCY, URINE    EKG None  Radiology CT ABDOMEN PELVIS W CONTRAST  Result Date: 10/29/2021 CLINICAL DATA:  Abdominal pain with vomiting and diarrhea. EXAM: CT ABDOMEN AND PELVIS WITH CONTRAST TECHNIQUE: Multidetector CT imaging of the abdomen and pelvis was performed using the standard protocol following bolus administration of intravenous contrast. CONTRAST:  12m ISOVUE-370 IOPAMIDOL (ISOVUE-370) INJECTION 76% COMPARISON:  None.  FINDINGS: Lower chest: Mild atelectasis is seen within the right lung base. Hepatobiliary: No focal liver abnormality is seen. No gallstones, gallbladder wall thickening, or biliary dilatation. Pancreas: Unremarkable. No pancreatic ductal dilatation or surrounding inflammatory changes. Spleen: The spleen is mildly enlarged. Adrenals/Urinary Tract: Adrenal glands are unremarkable. Kidneys are normal, without renal calculi, focal lesion, or hydronephrosis. Bladder is unremarkable. Stomach/Bowel: Stomach is within normal limits. Appendix appears normal. No evidence of bowel wall thickening, distention, or inflammatory changes. Vascular/Lymphatic: No significant vascular findings are present. No enlarged abdominal or pelvic lymph nodes. Reproductive: Uterus and bilateral adnexa are unremarkable. Other: A 2.9 cm x 3.4 cm fat containing left paraumbilical hernia is seen. No abdominopelvic ascites. Musculoskeletal: No acute or significant osseous findings. IMPRESSION: 1. Mild splenomegaly. 2. 2.9 cm x 3.4 cm fat containing left paraumbilical hernia. Electronically Signed   By: TVirgina NorfolkM.D.   On: 10/29/2021 02:13    Procedures Procedures   Medications Ordered in ED Medications  ondansetron (ZOFRAN) injection 4 mg (4 mg Intravenous Not Given 10/29/21 0120)  lactated ringers bolus 2,000 mL (2,000 mLs Intravenous New Bag/Given 10/29/21 0119)  fentaNYL (SUBLIMAZE) injection 100 mcg (100 mcg Intravenous Given 10/29/21 0113)  iopamidol (ISOVUE-370) 76 % injection 75 mL (75 mLs Intravenous Contrast Given 10/29/21 0210)    ED Course  I have reviewed the triage vital signs and the nursing notes.  Pertinent labs & imaging results that were available during my care of the patient were reviewed by me and considered in my medical decision making (see chart for details).    MDM Rules/Calculators/A&P                           Patient with history of sickle cell disease presents with abdominal pain with  vomiting and diarrhea.  Overall patient is well-appearing, but does have abdominal tenderness as well as tachycardia Will proceed with fluids, medications and CT imaging 3:03 AM Patient is feeling improved.  CT imaging is negative for acute process BP 104/67 (BP Location: Left Arm)   Pulse (!) 116   Temp 98.1 F (36.7 C) (Oral)   Resp 18   Ht 1.702 m (5' 7")   Wt 88.5 kg   SpO2 100%   BMI 30.54 kg/m  She is taking oral fluids. Given the presence of vomiting  diarrhea, suspect viral gastroenteritis. Will finish up IV fluids and anticipate discharge home. We discussed strict  return precautions.  Patient is overall well-appearing and in no acute distress Final Clinical Impression(s) / ED Diagnoses Final diagnoses:  Nausea vomiting and diarrhea  Generalized abdominal pain    Rx / DC Orders ED Discharge Orders     None        Ripley Fraise, MD 10/29/21 [phone]

## 2021-10-29 NOTE — ED Notes (Signed)
RN reviewed discharge instructions with pt. Pt verbalized understanding and had no further questions. VSS upon discharge.  

## 2021-11-23 DIAGNOSIS — Z23 Encounter for immunization: Secondary | ICD-10-CM | POA: Diagnosis not present

## 2022-02-28 ENCOUNTER — Telehealth: Payer: Self-pay

## 2022-02-28 NOTE — Telephone Encounter (Signed)
.. ?  Medicaid Managed Care  ? ?Unsuccessful Outreach Note ? ?02/28/2022 ?Name: Nicole Case MRN: [id number] DOB: June 08, 2001 ? ?Referred by: Patient, No Pcp Per (Inactive) ?Reason for referral : High Risk Managed Medicaid (I called the patient today to get her scheduled with the MM Team. I left my name and number on her VM.) ? ? ?An unsuccessful telephone outreach was attempted today. The patient was referred to the case management team for assistance with care management and care coordination.  ? ?Follow Up Plan: The care management team will reach out to the patient again over the next 7-14 days.  ? ? ?Weston Settle ?Care Guide, High Risk Medicaid Managed Care ?Embedded Care Coordination ?[place]  [facility]  ? ? ?SIGNATURE  ?

## 2022-03-08 ENCOUNTER — Encounter (HOSPITAL_COMMUNITY): Payer: Self-pay

## 2022-03-08 ENCOUNTER — Other Ambulatory Visit: Payer: Self-pay

## 2022-03-08 ENCOUNTER — Emergency Department (HOSPITAL_COMMUNITY)
Admission: EM | Admit: 2022-03-08 | Discharge: 2022-03-08 | Disposition: A | Discharge status: Home / Self Care | Payer: Medicaid Other | Attending: Emergency Medicine | Admitting: Emergency Medicine

## 2022-03-08 ENCOUNTER — Emergency Department (HOSPITAL_COMMUNITY): Payer: Medicaid Other

## 2022-03-08 DIAGNOSIS — R0789 Other chest pain: Secondary | ICD-10-CM | POA: Diagnosis not present

## 2022-03-08 DIAGNOSIS — R079 Chest pain, unspecified: Secondary | ICD-10-CM

## 2022-03-08 DIAGNOSIS — R030 Elevated blood-pressure reading, without diagnosis of hypertension: Secondary | ICD-10-CM | POA: Insufficient documentation

## 2022-03-08 DIAGNOSIS — R0981 Nasal congestion: Secondary | ICD-10-CM | POA: Diagnosis not present

## 2022-03-08 LAB — URINALYSIS, ROUTINE W REFLEX MICROSCOPIC
Bilirubin Urine: NEGATIVE
Glucose, UA: NEGATIVE mg/dL
Hgb urine dipstick: NEGATIVE
Ketones, ur: NEGATIVE mg/dL
Leukocytes,Ua: NEGATIVE
Nitrite: NEGATIVE
Protein, ur: NEGATIVE mg/dL
Specific Gravity, Urine: 1.001 — ABNORMAL LOW (ref 1.005–1.030)
pH: 6 (ref 5.0–8.0)

## 2022-03-08 LAB — TROPONIN I (HIGH SENSITIVITY): Troponin I (High Sensitivity): 3 ng/L (ref <18)

## 2022-03-08 LAB — BASIC METABOLIC PANEL
Anion gap: 4 — ABNORMAL LOW (ref 5–15)
BUN: 6 mg/dL (ref 6–20)
CO2: 27 mmol/L (ref 22–32)
Calcium: 9 mg/dL (ref 8.9–10.3)
Chloride: 108 mmol/L (ref 98–111)
Creatinine, Ser: 0.58 mg/dL (ref 0.44–1.00)
GFR, Estimated: >60 mL/min (ref >60)
Glucose, Bld: 86 mg/dL (ref 70–99)
Potassium: 3.8 mmol/L (ref 3.5–5.1)
Sodium: 139 mmol/L (ref 135–145)

## 2022-03-08 LAB — CBC
HCT: 32.1 % — ABNORMAL LOW (ref 36.0–46.0)
Hemoglobin: 11.1 g/dL — ABNORMAL LOW (ref 12.0–15.0)
MCH: 23.4 pg — ABNORMAL LOW (ref 26.0–34.0)
MCHC: 34.6 g/dL (ref 30.0–36.0)
MCV: 67.7 fL — ABNORMAL LOW (ref 80.0–100.0)
Platelets: 173 10*3/uL (ref 150–400)
RBC: 4.74 MIL/uL (ref 3.87–5.11)
RDW: 20.1 % — ABNORMAL HIGH (ref 11.5–15.5)
WBC: 4.7 10*3/uL (ref 4.0–10.5)
nRBC: 0 % (ref 0.0–0.2)

## 2022-03-08 LAB — I-STAT BETA HCG BLOOD, ED (MC, WL, AP ONLY): I-stat hCG, quantitative: <5 m[IU]/mL (ref <5)

## 2022-03-08 MED ORDER — FAMOTIDINE 20 MG PO TABS: 20.0000 mg | ORAL_TABLET | Freq: Two times a day (BID) | ORAL | 0 refills | Status: DC

## 2022-03-08 MED ORDER — ACETAMINOPHEN 500 MG PO TABS: 1000.0000 mg | ORAL_TABLET | Freq: Four times a day (QID) | ORAL | 0 refills | Status: DC | PRN

## 2022-03-08 NOTE — ED Provider Triage Note (Signed)
Emergency Medicine Provider Triage Evaluation Note ? ?Nicole Case , a 21 y.o. female  was evaluated in triage.  Pt complains of chest pain and lumbar back pain.  Patient reports that she has been dealing with intermittent chest pain over the last 3 days.  Last episode of chest pain occurred while she was at work getting her blood pressure checked.  This occurred at approximately 330.  Chest pain lasted for approximately 30 minutes.  Pain is midsternal and does not radiate.  Patient describes pain as a "aching heaviness."  Pain does not radiate.  Patient does endorse feeling anxious and sweating.  Denies any nausea, vomiting, shortness of breath, lightheadedness, or syncope. ? ?Patient reports that she has been having intermittent lumbar back pain over the last 3 days as well.   ? ?LMP 3/6 ? ?Review of Systems  ?Positive: Chest pain, lumbar back pain, anxiety, palpitations ?Negative: Dysuria, hematuria, urinary urgency, nausea, vomiting, lightheadedness, syncope, shortness of breath ? ?Physical Exam  ?BP (!) 137/115 (BP Location: Left Arm)   Pulse 88   Temp 98.1 ?F (36.7 ?C) (Oral)   Resp 17   LMP 02/06/2022 (Approximate)   SpO2 100%  ?Gen:   Awake, no distress   ?Resp:  Normal effort, clear to auscultation bilaterally ?MSK:   Moves extremities without difficulty  ?Other:  +2 radial pulse bilaterally.  No swelling or tenderness to bilateral lower extremities. ? ?Medical Decision Making  ?Medically screening exam initiated at 5:26 PM.  Appropriate orders placed.  Nicole Case was informed that the remainder of the evaluation will be completed by another provider, this initial triage assessment does not replace that evaluation, and the importance of remaining in the ED until their evaluation is complete. ? ? ?  ?Haskel Schroeder, PA-C ?03/08/22 1728 ? ?

## 2022-03-08 NOTE — ED Provider Notes (Signed)
?[facility] DEPT ?Provider Note ? ? ?CSN: [id number] ?Arrival date & time: 03/08/22  1631 ? ?  ? ?History ? ?Chief Complaint  ?Patient presents with  ? Chest Pain  ? ? ?Nicole Case is a 21 y.o. female. ? ?HPI ?Patient reports he has had symptoms for about 3 days.  He reports he was at work today and her blood pressure was elevated.  She has had chest discomfort has been coming and going.  She reports its a central pressure and she also feels that into her back.  He denies she had any associated fever or cough.  Reports she had some nasal congestion but not a lot.  No sore throat.  No vomiting.  No significant abdominal pain.  Patient denies she is getting pain or swelling of her calves.  Patient reports she did try Pepcid and felt some relief. ?  ? ?Home Medications ?Prior to Admission medications   ?Medication Sig Start Date End Date Taking? Authorizing Provider  ?acetaminophen (TYLENOL) 500 MG tablet Take 2 tablets (1,000 mg total) by mouth every 6 (six) hours as needed. 03/08/22  Yes Charlesetta Shanks, MD  ?famotidine (PEPCID) 20 MG tablet Take 1 tablet (20 mg total) by mouth 2 (two) times daily. 03/08/22  Yes Charlesetta Shanks, MD  ?Accu-Chek Softclix Lancets lancets Use one lancet to check blood glucose 4 times daily. ?Patient not taking: Reported on 10/29/2021 12/24/20   Constant, Peggy, MD  ?albuterol (VENTOLIN HFA) 108 (90 Base) MCG/ACT inhaler Inhale 2 puffs into the lungs every 6 (six) hours as needed for wheezing or shortness of breath. ?Patient not taking: Reported on 10/29/2021    [provider]  ?amLODipine (NORVASC) 5 MG tablet TAKE 1 TABLET (5 MG TOTAL) BY MOUTH DAILY. ?Patient not taking: Reported on 10/29/2021 01/04/21 01/04/22  Shary Key, DO  ?Blood Pressure Monitoring (BLOOD PRESSURE KIT) DEVI 1 kit by Does not apply route once a week. Check Blood Pressure regularly and record readings into the Babys[employer]s App.  Large Cuff.  DX O90.0 ?Patient not taking:  Reported on 09/23/2020 09/21/20   Cephas Darby, MD  ?coconut oil OIL Apply 1 application topically as needed. ?Patient not taking: Reported on 10/29/2021 01/04/21   Shary Key, DO  ?glucose blood (ACCU-CHEK GUIDE) test strip Use 1 test strip to check blood glucose 4 times daily ?Patient not taking: Reported on 10/29/2021 12/24/20   Constant, Peggy, MD  ?naproxen sodium (ALEVE) 220 MG tablet Take 440 mg by mouth daily as needed (pain).    [provider]  ?triamcinolone cream (KENALOG) 0.1 % Apply 1 application topically 2 (two) times daily. ?Patient not taking: Reported on 10/29/2021 09/27/21   Pearson Forster, NP  ?   ? ?Allergies    ?Patient has no known allergies.   ? ?Review of Systems   ?Review of Systems ?10 systems reviewed negative except as per HPI ?Physical Exam ?Updated Vital Signs ?BP (!) 143/99   Pulse 81   Temp 97.9 ?F (36.6 ?C) (Oral)   Resp 17   LMP 02/06/2022 (Approximate)   SpO2 99%  ?Physical Exam ?Constitutional:   ?   Comments: Patient is alert and nontoxic.  Clinically well in appearance.  No respiratory distress.  ?HENT:  ?   Nose:  ?   Comments: Patient has nasal congestion with some clear drainage. ?   Mouth/Throat:  ?   Mouth: Mucous membranes are moist.  ?   Pharynx: Oropharynx is clear.  ?  Comments: No exudates of the tonsils.  No erythema or tonsillar enlargement ?Eyes:  ?   Extraocular Movements: Extraocular movements intact.  ?   Pupils: Pupils are equal, round, and reactive to light.  ?Cardiovascular:  ?   Rate and Rhythm: Normal rate and regular rhythm.  ?Pulmonary:  ?   Effort: Pulmonary effort is normal.  ?   Breath sounds: Normal breath sounds.  ?Abdominal:  ?   General: There is no distension.  ?   Palpations: Abdomen is soft.  ?   Tenderness: There is no abdominal tenderness. There is no guarding.  ?Musculoskeletal:     ?   General: No swelling or tenderness. Normal range of motion.  ?   Right lower leg: No edema.  ?   Left lower leg: No edema.  ?Skin: ?    General: Skin is warm and dry.  ?Neurological:  ?   General: No focal deficit present.  ?   Mental Status: She is oriented to person, place, and time.  ?   Motor: No weakness.  ?   Coordination: Coordination normal.  ?Psychiatric:     ?   Mood and Affect: Mood normal.  ? ? ?ED Results / Procedures / Treatments   ?Labs ?(all labs ordered are listed, but only abnormal results are displayed) ?Labs Reviewed  ?BASIC METABOLIC PANEL - Abnormal; Notable for the following components:  ?    Result Value  ? Anion gap 4 (*)   ? All other components within normal limits  ?CBC - Abnormal; Notable for the following components:  ? Hemoglobin 11.1 (*)   ? HCT 32.1 (*)   ? MCV 67.7 (*)   ? MCH 23.4 (*)   ? RDW 20.1 (*)   ? All other components within normal limits  ?URINALYSIS, ROUTINE W REFLEX MICROSCOPIC - Abnormal; Notable for the following components:  ? Color, Urine STRAW (*)   ? Specific Gravity, Urine 1.001 (*)   ? All other components within normal limits  ?I-STAT BETA HCG BLOOD, ED ([facility], [facility], [facility] ONLY)  ?TROPONIN I (HIGH SENSITIVITY)  ?TROPONIN I (HIGH SENSITIVITY)  ? ? ?EKG ?EKG Interpretation ? ?Date/Time:  Wednesday March 08 2022 16:52:40 EDT ?Ventricular Rate:  88 ?PR Interval:  149 ?QRS Duration: 82 ?QT Interval:  342 ?QTC Calculation: 414 ?R Axis:   51 ?Text Interpretation: Sinus rhythm Multiple ventricular premature complexes Baseline wander in lead(s) V5 PVC, otherwise no change from previous Confirmed by Charlesetta Shanks [phone]) on 03/08/2022 10:31:57 PM ? ?Radiology ?DG Chest 2 View ? ?Result Date: 03/08/2022 ?CLINICAL DATA:  Chest pain EXAM: CHEST - 2 VIEW COMPARISON:  10/19/2020 FINDINGS: The heart size and mediastinal contours are within normal limits. Both lungs are clear. The visualized skeletal structures are unremarkable. IMPRESSION: No active cardiopulmonary disease. Electronically Signed   By: Jerilynn Mages.  Shick M.D.   On: 03/08/2022 18:20   ? ?Procedures ?Procedures  ? ? ?Medications Ordered in ED ?Medications - No  data to display ? ?ED Course/ Medical Decision Making/ A&P ?  ?                        ?Medical Decision Making ?Amount and/or Complexity of Data Reviewed ?Labs: ordered. ?Radiology: ordered. ? ?Risk ?OTC drugs. ? ? ?Patient presents with report of chest pain for about 3 days.  This has been waxing and waning.  Minimal associated symptoms.  Patient clinically is well in appearance.  Very mild associated URI symptoms. ? ?  Differential diagnosis includes pneumonia, bronchitis, GI etiology such as GERD or esophageal spasm.  Lower suspicion for PE with normal oxygenation no significant shortness of breath and no lower extremity swelling or calf pain.  ACS considered.  Patient's EKG is unchanged from previous.  Troponin normal. ? ?Patient does have history of sickle cell disease.  At this time she does not appear to be having acute chest or pain crisis.  Patient is not showing signs of distress and diagnostic work-up negative. ? ? ?At this time with some suggestion of GERD type symptomology will initiate twice daily Pepcid for a week.  I have also recommend patient empirically take Tylenol for pain if needed.  Patient is agreeable with plan at this time.  Extensive return precautions reviewed. ? ? ? ? ? ? ? ?Final Clinical Impression(s) / ED Diagnoses ?Final diagnoses:  ?Nonspecific chest pain  ? ? ?Rx / DC Orders ?ED Discharge Orders   ? ?      Ordered  ?  famotidine (PEPCID) 20 MG tablet  2 times daily       ? 03/08/22 2224  ?  acetaminophen (TYLENOL) 500 MG tablet  Every 6 hours PRN       ? 03/08/22 2224  ? ?  ?  ? ?  ? ? ?  ?Charlesetta Shanks, MD ?03/08/22 2239 ? ?

## 2022-03-08 NOTE — ED Triage Notes (Signed)
Pt reports chest pain and back pain that started 3 days ago intermittently. Pt reports chest feels heavy.  ? ?Pt reports BP was elevated at work today.  ? ?Denies n/v or shob  ? ?A/Ox4 ?Ambulatory in triage  ?

## 2022-03-08 NOTE — ED Notes (Signed)
Discharge instructions including prescription and follow up care discussed with pt. Pt verbalized understanding with no questions at this time.  

## 2022-03-08 NOTE — Discharge Instructions (Signed)
1.  Start taking Pepcid twice daily for a week.  Your symptoms may be related to reflux.  Read instructions regarding gastroesophageal reflux disease. ?2.  At this time there is no sign of pneumonia, heart attack.  Return to the emergency department immediately if you have new worsening or changing symptoms. ?3.  Schedule appointment to see your doctor for recheck in the next 3 to 7 days. ?

## 2022-03-09 ENCOUNTER — Telehealth: Payer: Self-pay

## 2022-03-09 NOTE — Telephone Encounter (Signed)
Transition Care Management Unsuccessful Follow-up Telephone Call ? ?Date of discharge and from where:  03/08/2022-[facility]  ? ?Attempts:  1st Attempt ? ?Reason for unsuccessful TCM follow-up call:  Unable to reach patient ? ?  ?

## 2022-03-10 NOTE — Telephone Encounter (Signed)
Transition Care Management Unsuccessful Follow-up Telephone Call ? ?Date of discharge and from where:  03/08/2022-[facility]  ? ?Attempts:  2nd Attempt ? ?Reason for unsuccessful TCM follow-up call:  Unable to reach patient ? ?  ?

## 2022-03-13 NOTE — Telephone Encounter (Signed)
Transition Care Management Unsuccessful Follow-up Telephone Call ? ?Date of discharge and from where:  03/08/2022-[facility]  ? ?Attempts:  3rd Attempt ? ?Reason for unsuccessful TCM follow-up call:  Unable to reach patient ? ?  ?

## 2022-04-20 ENCOUNTER — Telehealth: Payer: Self-pay | Admitting: *Deleted

## 2022-04-20 NOTE — Patient Outreach (Signed)
Care Coordination  04/20/2022  [place] 2001-06-05 [id number]   Medicaid Managed Care   Unsuccessful Outreach Note  04/20/2022 Name: Nicole Case MRN: [id number] DOB: [birth year]  Referred by: [facility] Reason for referral : High Risk Managed Medicaid (Unsuccessful RNCM outreach)   A second unsuccessful telephone outreach was attempted today. The patient was referred to the case management team for assistance with care management and care coordination.   Follow Up Plan: A HIPAA compliant phone message was left for the patient providing contact information and requesting a return call.   Lurena Joiner RN, BSN [place]  [employer] manager

## 2022-04-24 ENCOUNTER — Telehealth: Payer: Self-pay | Admitting: *Deleted

## 2022-04-24 NOTE — Patient Outreach (Signed)
Care Coordination  04/24/2022  [name] [birth year] [id number]   Medicaid Managed Care   Unsuccessful Outreach Note  04/24/2022 Name: Nicole Case MRN: [id number] DOB: 08-Mar-2001  Referred by: [facility] Reason for referral : High Risk Managed Medicaid (Unsuccessful RNCM outreach, 3rd attempt)   Third unsuccessful telephone outreach was attempted today. The patient was referred to the case management team for assistance with care management and care coordination. The patient's primary care provider has been notified of our unsuccessful attempts to make or maintain contact with the patient. The care management team is pleased to engage with this patient at any time in the future should he/she be interested in assistance from the care management team.   Follow Up Plan: A HIPAA compliant phone message was left for the patient providing contact information and requesting a return call.   Estanislado Emms RN, BSN [place]  [facility]

## 2023-01-16 ENCOUNTER — Encounter (HOSPITAL_COMMUNITY): Payer: Self-pay | Admitting: Emergency Medicine

## 2023-01-16 ENCOUNTER — Other Ambulatory Visit: Payer: Self-pay

## 2023-01-16 ENCOUNTER — Emergency Department (HOSPITAL_COMMUNITY)
Admission: EM | Admit: 2023-01-16 | Discharge: 2023-01-16 | Disposition: A | Discharge status: Home / Self Care | Payer: Medicaid Other | Attending: Emergency Medicine | Admitting: Emergency Medicine

## 2023-01-16 ENCOUNTER — Emergency Department (HOSPITAL_COMMUNITY): Payer: Medicaid Other

## 2023-01-16 DIAGNOSIS — X58XXXA Exposure to other specified factors, initial encounter: Secondary | ICD-10-CM | POA: Insufficient documentation

## 2023-01-16 DIAGNOSIS — S90221A Contusion of right lesser toe(s) with damage to nail, initial encounter: Secondary | ICD-10-CM

## 2023-01-16 DIAGNOSIS — S99922A Unspecified injury of left foot, initial encounter: Secondary | ICD-10-CM | POA: Diagnosis not present

## 2023-01-16 DIAGNOSIS — S90121A Contusion of right lesser toe(s) without damage to nail, initial encounter: Secondary | ICD-10-CM | POA: Insufficient documentation

## 2023-01-16 DIAGNOSIS — K2900 Acute gastritis without bleeding: Secondary | ICD-10-CM

## 2023-01-16 DIAGNOSIS — R1013 Epigastric pain: Secondary | ICD-10-CM | POA: Diagnosis present

## 2023-01-16 LAB — I-STAT BETA HCG BLOOD, ED (MC, WL, AP ONLY): I-stat hCG, quantitative: <5 m[IU]/mL (ref <5)

## 2023-01-16 LAB — CBC WITH DIFFERENTIAL/PLATELET
Abs Immature Granulocytes: 0.08 K/uL — ABNORMAL HIGH (ref 0.00–0.07)
Basophils Absolute: 0 K/uL (ref 0.0–0.1)
Basophils Relative: 0 %
Eosinophils Absolute: 0.1 K/uL (ref 0.0–0.5)
Eosinophils Relative: 1 %
HCT: 31.8 % — ABNORMAL LOW (ref 36.0–46.0)
Hemoglobin: 11.1 g/dL — ABNORMAL LOW (ref 12.0–15.0)
Immature Granulocytes: 1 %
Lymphocytes Relative: 23 %
Lymphs Abs: 1.6 K/uL (ref 0.7–4.0)
MCH: 23.6 pg — ABNORMAL LOW (ref 26.0–34.0)
MCHC: 34.9 g/dL (ref 30.0–36.0)
MCV: 67.7 fL — ABNORMAL LOW (ref 80.0–100.0)
Monocytes Absolute: 0.4 K/uL (ref 0.1–1.0)
Monocytes Relative: 6 %
Neutro Abs: 4.9 K/uL (ref 1.7–7.7)
Neutrophils Relative %: 69 %
Platelets: 163 K/uL (ref 150–400)
RBC: 4.7 MIL/uL (ref 3.87–5.11)
RDW: 19.8 % — ABNORMAL HIGH (ref 11.5–15.5)
WBC: 7.1 K/uL (ref 4.0–10.5)
nRBC: 0 % (ref 0.0–0.2)

## 2023-01-16 LAB — COMPREHENSIVE METABOLIC PANEL
ALT: 21 U/L (ref 0–44)
AST: 26 U/L (ref 15–41)
Albumin: 4 g/dL (ref 3.5–5.0)
Alkaline Phosphatase: 59 U/L (ref 38–126)
Anion gap: 8 (ref 5–15)
BUN: 6 mg/dL (ref 6–20)
CO2: 22 mmol/L (ref 22–32)
Calcium: 8.9 mg/dL (ref 8.9–10.3)
Chloride: 108 mmol/L (ref 98–111)
Creatinine, Ser: 0.43 mg/dL — ABNORMAL LOW (ref 0.44–1.00)
GFR, Estimated: >60 mL/min (ref >60)
Glucose, Bld: 117 mg/dL — ABNORMAL HIGH (ref 70–99)
Potassium: 4.2 mmol/L (ref 3.5–5.1)
Sodium: 138 mmol/L (ref 135–145)
Total Bilirubin: 1.3 mg/dL — ABNORMAL HIGH (ref 0.3–1.2)
Total Protein: 7.6 g/dL (ref 6.5–8.1)

## 2023-01-16 LAB — URINALYSIS, ROUTINE W REFLEX MICROSCOPIC
Bilirubin Urine: NEGATIVE
Glucose, UA: NEGATIVE mg/dL
Hgb urine dipstick: NEGATIVE
Ketones, ur: NEGATIVE mg/dL
Leukocytes,Ua: NEGATIVE
Nitrite: NEGATIVE
Protein, ur: NEGATIVE mg/dL
Specific Gravity, Urine: 1.008 (ref 1.005–1.030)
pH: 5 (ref 5.0–8.0)

## 2023-01-16 LAB — LIPASE, BLOOD: Lipase: 25 U/L (ref 11–51)

## 2023-01-16 MED ORDER — FAMOTIDINE 40 MG PO TABS: 40.0000 mg | ORAL_TABLET | Freq: Every day | ORAL | 0 refills | Status: DC

## 2023-01-16 MED ORDER — MYLANTA MAXIMUM STRENGTH 400-400-40 MG/5ML PO SUSP: 15.0000 mL | Freq: Four times a day (QID) | ORAL | 0 refills | Status: DC | PRN

## 2023-01-16 NOTE — ED Triage Notes (Signed)
Patient arrives ambulatory by POV c/o left big toe pain x 2 days. Patient states she stubbed her toe on bed.  Patient also c/o mid abdominal pain x 1.5 weeks. Denies any N/V/D.

## 2023-01-16 NOTE — ED Provider Triage Note (Signed)
Emergency Medicine Provider Triage Evaluation Note  Nicole Case , a 22 y.o. female  was evaluated in triage.  Pt complains of epigastric abdominal pain for 1 and half week.  Pain is dull, intermittent, nonradiating.  Denies aggravating or alleviating factors.  No fever, nausea, vomiting, bowel changes, urinary symptoms, blood in stool or urine.  Patient also reports stubbing her left big toe on bed.  She has noticed some swelling to her L big toe and pain since.  Review of Systems  Positive: As above Negative: As above  Physical Exam  BP (!) 141/83 (BP Location: Right Arm)   Pulse 90   Temp 98.1 F (36.7 C) (Oral)   Resp 18   Ht 5' 7"$  (1.702 m)   Wt 88.5 kg   LMP 12/20/2022   SpO2 100%   BMI 30.54 kg/m  Gen:   Awake, no distress   Resp:  Normal effort  MSK:   Moves extremities without difficulty  Other:    Medical Decision Making  Medically screening exam initiated at 2:58 PM.  Appropriate orders placed.  Nicole Case was informed that the remainder of the evaluation will be completed by another provider, this initial triage assessment does not replace that evaluation, and the importance of remaining in the ED until their evaluation is complete.    Rex Kras, [place] 01/16/23 2130

## 2023-01-16 NOTE — ED Notes (Signed)
Pt ambulatory to bathroom for urine sample.

## 2023-01-16 NOTE — ED Notes (Signed)
Dr. Truett Mainland at bedside

## 2023-01-19 NOTE — ED Provider Notes (Signed)
[place] EMERGENCY DEPARTMENT AT [facility] Provider Note  CSN: [id number] Arrival date & time: 01/16/23 1441  Chief Complaint(s) Abdominal Pain and Toe Pain  HPI Nicole Case is a 22 y.o. female with history of sickle cell presenting to the emergency department with abdominal pain.  Patient reports abdominal pain for 1-1/2 weeks.  She reports it is in her epigastric region.  Reports it is sharp, intermittent.  She reports it is mild.  No nausea, vomiting, fevers, chills, dysuria, flank pain, diarrhea, chest pain, shortness of breath.  Not similar to sickle cell pain.  She has not taken anything for the pain.  She denies any pain currently.  Patient also reports that she stubbed her left big toe on bed 2 days ago.  Reports some pain with walking.  Reports pain is mild.   Past Medical History Past Medical History:  Diagnosis Date   Anxiety    Gestational diabetes    Sickle cell anemia ([place])    Patient Active Problem List   Diagnosis Date Noted   Gestational hypertension 01/04/2021   Decreased fetal movement during pregnancy in third trimester, antepartum 12/31/2020   GBS (group B Streptococcus carrier), +RV culture, currently pregnant 12/27/2020   History of COVID-19 11/24/2020   Gestational diabetes mellitus, diet-controlled 11/24/2020   Thrombocytopenia affecting pregnancy ([place]) 10/21/2020   Hypokalemia 10/20/2020   Normal IUP (intrauterine pregnancy) on prenatal ultrasound, third trimester 10/20/2020   Pneumonia due to COVID-19 virus 10/19/2020   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   Carrier of genetic disorder 07/10/2020   Pap 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 07/06/2020   Trichomonal vaginitis during pregnancy in first trimester 07/03/2020   Anemia 07/03/2020   Maternal morbid obesity, antepartum ([place]) 06/29/2020   Supervision of high risk pregnancy, antepartum 06/28/2020   Central chest pain    Sickle cell anemia with crisis ([place])  06/20/2020   Sore throat 10/06/2019   Fever 10/06/2019   Urinary tract infection symptoms 10/06/2019   Sickle-cell disease with pain ([place]) 08/01/2017   Sickle cell disease, type [place], with unspecified crisis ([place]) 07/31/2017   Sickle cell anemia with pain ([place]) 05/28/2016   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 Authorizing Provider  alum & mag hydroxide-simeth (MYLANTA MAXIMUM STRENGTH) 400-400-40 MG/5ML suspension Take 15 mLs by mouth every 6 (six) hours as needed for indigestion. 01/16/23  Yes Cristie Hem, MD  famotidine (PEPCID) 40 MG tablet Take 1 tablet (40 mg total) by mouth daily. 01/16/23  Yes Cristie Hem, MD  Accu-Chek Softclix Lancets lancets Use one lancet to check blood glucose 4 times daily. Patient not taking: Reported on 10/29/2021 12/24/20   Constant, Peggy, MD  acetaminophen (TYLENOL) 500 MG tablet Take 2 tablets (1,000 mg total) by mouth every 6 (six) hours as needed. 03/08/22   Charlesetta Shanks, MD  albuterol (VENTOLIN HFA) 108 (90 Base) MCG/ACT inhaler Inhale 2 puffs into the lungs every 6 (six) hours as needed for wheezing or shortness of breath. Patient not taking: Reported on 10/29/2021    [provider]  amLODipine (NORVASC) 5 MG tablet TAKE 1 TABLET (5 MG TOTAL) BY MOUTH DAILY. Patient not taking: Reported on 10/29/2021 01/04/21 01/04/22  Shary Key, DO  Blood Pressure Monitoring (BLOOD PRESSURE KIT) DEVI 1  kit by Does not apply route once a week. Check Blood Pressure regularly and record readings into the Babyscripts App.  Large Cuff.  DX O90.0 Patient not taking: Reported on 09/23/2020 09/21/20   Cephas Darby, MD  coconut oil OIL Apply 1 application topically as needed. Patient not taking: Reported on 10/29/2021 01/04/21   Shary Key, DO  glucose blood (ACCU-CHEK GUIDE) test strip Use 1 test strip to check blood glucose 4 times daily Patient not taking: Reported on 10/29/2021 12/24/20   Constant, Peggy, MD  naproxen sodium (ALEVE) 220 MG tablet Take 440 mg by mouth daily as needed (pain).    [provider]  triamcinolone cream (KENALOG) 0.1 % Apply 1 application topically 2 (two) times daily. Patient not taking: Reported on 10/29/2021 09/27/21   Pearson Forster, NP                                                                                                                                    Past Surgical History Past Surgical History:  Procedure Laterality Date   NO PAST SURGERIES     Family History Family History  Problem Relation Age of Onset   Sickle cell anemia Brother     Social History Social History   Tobacco Use   Smoking status: Never   Smokeless tobacco: Never  Vaping Use   Vaping Use: Never used  Substance Use Topics   Alcohol use: No   Drug use: Not Currently    Types: Marijuana   Allergies Patient has no known allergies.  Review of Systems Review of Systems  All other systems reviewed and are negative.   Physical Exam Vital Signs  I have reviewed the triage vital signs BP 135/80   Pulse 87   Temp 98.1 F (36.7 C) (Oral)   Resp 16   Ht 5' 7"$  (1.702 m)   Wt 88.5 kg   LMP 12/20/2022   SpO2 99%   BMI 30.54 kg/m  Physical Exam Vitals and nursing note reviewed.  Constitutional:      General: She is not in acute distress.    Appearance: She is well-developed.  HENT:     Head: Normocephalic and atraumatic.     Mouth/Throat:     Mouth: Mucous membranes are moist.  Eyes:     Pupils: Pupils are equal, round, and reactive to light.  Cardiovascular:     Rate and Rhythm: Normal rate and regular rhythm.     Heart sounds: No murmur heard. Pulmonary:     Effort: Pulmonary effort is normal. No respiratory distress.     Breath sounds: Normal breath sounds.   Abdominal:     General: Abdomen is flat.     Palpations: Abdomen is soft.     Tenderness: There is no abdominal tenderness.  Musculoskeletal:        General: No tenderness.     Right lower leg:  No edema.     Left lower leg: No edema.     Comments: Left great toe with small subungual hematoma, otherwise normal appearing, normal range of motion.  Mild tenderness.  No erythema.  Skin:    General: Skin is warm and dry.  Neurological:     General: No focal deficit present.     Mental Status: She is alert. Mental status is at baseline.  Psychiatric:        Mood and Affect: Mood normal.        Behavior: Behavior normal.     ED Results and Treatments Labs (all labs ordered are listed, but only abnormal results are displayed) Labs Reviewed  CBC WITH DIFFERENTIAL/PLATELET - Abnormal; Notable for the following components:      Result Value   Hemoglobin 11.1 (*)    HCT 31.8 (*)    MCV 67.7 (*)    MCH 23.6 (*)    RDW 19.8 (*)    Abs Immature Granulocytes 0.08 (*)    All other components within normal limits  COMPREHENSIVE METABOLIC PANEL - Abnormal; Notable for the following components:   Glucose, Bld 117 (*)    Creatinine, Ser 0.43 (*)    Total Bilirubin 1.3 (*)    All other components within normal limits  LIPASE, BLOOD  URINALYSIS, ROUTINE W REFLEX MICROSCOPIC  I-STAT BETA HCG BLOOD, ED ([facility], [facility], [facility] ONLY)                                                                                                                          Radiology No results found.  Pertinent labs & imaging results that were available during my care of the patient were reviewed by me and considered in my medical decision making (see MDM for details).  Medications Ordered in ED Medications - No data to display                                                                                                                                   Procedures Procedures  (including critical care time)  Medical  Decision Making / ED Course   MDM:  22 year old female presenting with abdominal pain and toe pain.  Patient not currently having any pain at the moment.  Physical exam is reassuring.  No abdominal tenderness to suggest acute intra-abdominal pathology such as pancreatitis, cholecystitis, obstruction, perforation, volvulus, appendicitis.  Doubt sickle  cell process such as splenic infarction given age and no tenderness.  Labs reassuring with no leukocytosis.  Suspect possible gastritis.  Patient prescribed Pepcid but not taking it.  Will represcribe to trial this.  Also will prescribe Mylanta for patient to trial during episodes of pain.  Discussed return precautions.  Patient also complaining of toe pain.  Imaging obtained without evidence of fracture.  Patient does have a small subungual hematoma.  Subungual hematoma is less than 25% nailbed so doubt that trephination will be beneficial.  No evidence of infection or large skin wound.  Will discharge patient to home. All questions answered. Patient comfortable with plan of discharge. Return precautions discussed with patient and specified on the after visit summary.           Additional history obtained:  -External records from outside source obtained and reviewed including: Chart review including previous notes, labs, imaging, consultation notes including ED visit for chest pain 03/08/22   Lab Tests: -I ordered, reviewed, and interpreted labs.   The pertinent results include:   Labs Reviewed  CBC WITH DIFFERENTIAL/PLATELET - Abnormal; Notable for the following components:      Result Value   Hemoglobin 11.1 (*)    HCT 31.8 (*)    MCV 67.7 (*)    MCH 23.6 (*)    RDW 19.8 (*)    Abs Immature Granulocytes 0.08 (*)    All other components within normal limits  COMPREHENSIVE METABOLIC PANEL - Abnormal; Notable for the following components:   Glucose, Bld 117 (*)    Creatinine, Ser 0.43 (*)    Total Bilirubin 1.3 (*)    All other  components within normal limits  LIPASE, BLOOD  URINALYSIS, ROUTINE W REFLEX MICROSCOPIC  I-STAT BETA HCG BLOOD, ED ([facility], [facility], [facility] ONLY)    Notable for chronic anemia, no leukocytosis, mild elevated bilirubin likely 2/2 sickle cell disease  Imaging Studies ordered: I ordered imaging studies including XR foot On my interpretation imaging demonstrates left great toe normal I independently visualized and interpreted imaging. I agree with the radiologist interpretation   Medicines ordered and prescription drug management: Meds ordered this encounter  Medications   famotidine (PEPCID) 40 MG tablet    Sig: Take 1 tablet (40 mg total) by mouth daily.    Dispense:  30 tablet    Refill:  0   alum & mag hydroxide-simeth (MYLANTA MAXIMUM STRENGTH) 400-400-40 MG/5ML suspension    Sig: Take 15 mLs by mouth every 6 (six) hours as needed for indigestion.    Dispense:  355 mL    Refill:  0    -I have reviewed the patients home medicines and have made adjustments as needed   Social Determinants of Health:  Diagnosis or treatment significantly limited by social determinants of health: obesity   Co morbidities that complicate the patient evaluation  Past Medical History:  Diagnosis Date   Anxiety    Gestational diabetes    Sickle cell anemia (HCC)       Dispostion: Disposition decision including need for hospitalization was considered, and patient discharged from emergency department.    Final Clinical Impression(s) / ED Diagnoses Final diagnoses:  Acute gastritis without hemorrhage, unspecified gastritis type  Subungual hematoma of toe of right foot, initial encounter     This chart was dictated using voice recognition software.  Despite best efforts to proofread,  errors can occur which can change the documentation meaning.    Cristie Hem, MD 01/19/23 [phone]

## 2023-02-27 ENCOUNTER — Encounter (HOSPITAL_COMMUNITY): Payer: Self-pay

## 2023-02-27 ENCOUNTER — Ambulatory Visit (HOSPITAL_COMMUNITY)
Admission: EM | Admit: 2023-02-27 | Discharge: 2023-02-27 | Disposition: A | Discharge status: Home / Self Care | Payer: Medicaid Other | Attending: Family Medicine | Admitting: Family Medicine

## 2023-02-27 DIAGNOSIS — R519 Headache, unspecified: Secondary | ICD-10-CM | POA: Diagnosis not present

## 2023-02-27 DIAGNOSIS — R1013 Epigastric pain: Secondary | ICD-10-CM | POA: Diagnosis not present

## 2023-02-27 MED ORDER — KETOROLAC TROMETHAMINE 30 MG/ML IJ SOLN
INTRAMUSCULAR | Status: AC
  Filled 2023-02-27: qty 1

## 2023-02-27 MED ORDER — PANTOPRAZOLE SODIUM 40 MG PO TBEC: 40.0000 mg | DELAYED_RELEASE_TABLET | Freq: Every day | ORAL | 0 refills | Status: DC

## 2023-02-27 MED ORDER — KETOROLAC TROMETHAMINE 30 MG/ML IJ SOLN
30.0000 mg | Freq: Once | INTRAMUSCULAR | Status: AC
  Administered 2023-02-27: 30 mg via INTRAMUSCULAR

## 2023-02-27 NOTE — ED Triage Notes (Signed)
Pt states headache and abdominal pain for the past 3 days.  States she has not taken anything at home for it.

## 2023-02-27 NOTE — ED Provider Notes (Addendum)
[facility]    CSN: [id number] Arrival date & time: 02/27/23  1737      History   Chief Complaint Chief Complaint  Patient presents with   Headache    HPI Nicole Case is a 21 y.o. female.    Headache  Here for headache that is been going on for about 3 days.  It is caplike and is throbbing.  She has had nausea and she is thrown up a couple of times.  No fever and no cough or congestion.  She does not report a history of migraine headaches in the past  She has also had some epigastric pain in the last month or 2.  She was seen in the middle of February and she states she was not started on any medication for this.  She is having regular bowel movements and they are normal and brown.  Last menstrual cycle was February 28.  She does have a history of sickle cell disease.  She states she has not had any sickle cell pain crises in some time.  She does have primary care Past Medical History:  Diagnosis Date   Anxiety    Gestational diabetes    Sickle cell anemia ([place])     Patient Active Problem List   Diagnosis Date Noted   Gestational hypertension 01/04/2021   Decreased fetal movement during pregnancy in third trimester, antepartum 12/31/2020   GBS (group B Streptococcus carrier), +RV culture, currently pregnant 12/27/2020   History of COVID-19 11/24/2020   Gestational diabetes mellitus, diet-controlled 11/24/2020   Thrombocytopenia affecting pregnancy ([place]) 10/21/2020   Hypokalemia 10/20/2020   Normal IUP (intrauterine pregnancy) on prenatal ultrasound, third trimester 10/20/2020   Pneumonia due to COVID-19 virus 10/19/2020   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   Carrier of genetic disorder 07/10/2020   Pap 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 07/06/2020   Trichomonal vaginitis during pregnancy in first trimester 07/03/2020   Anemia 07/03/2020   Maternal morbid obesity, antepartum ([place]) 06/29/2020   Supervision of high risk  pregnancy, antepartum 06/28/2020   Central chest pain    Sickle cell anemia with crisis ([place]) 06/20/2020   Sore throat 10/06/2019   Fever 10/06/2019   Urinary tract infection symptoms 10/06/2019   Sickle-cell disease with pain ([place]) 08/01/2017   Sickle cell disease, type [place], with unspecified crisis ([place]) 07/31/2017   Sickle cell anemia with pain ([place]) 05/28/2016   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 Authorizing Provider  pantoprazole (PROTONIX) 40 MG tablet Take 1 tablet (40 mg total) by mouth daily. 02/27/23  Yes Barrett Henle, MD  Accu-Chek Softclix Lancets lancets Use one lancet to check blood glucose 4 times daily. Patient  not taking: Reported on 10/29/2021 12/24/20   Constant, Peggy, MD  acetaminophen (TYLENOL) 500 MG tablet Take 2 tablets (1,000 mg total) by mouth every 6 (six) hours as needed. 03/08/22   Charlesetta Shanks, MD  albuterol (VENTOLIN HFA) 108 (90 Base) MCG/ACT inhaler Inhale 2 puffs into the lungs every 6 (six) hours as needed for wheezing or shortness of breath. Patient not taking: Reported on 10/29/2021    [provider]  alum & mag hydroxide-simeth (MYLANTA MAXIMUM STRENGTH) 400-400-40 MG/5ML suspension Take 15 mLs by mouth every 6 (six) hours as needed for indigestion. 01/16/23   Cristie Hem, MD  amLODipine (NORVASC) 5 MG tablet TAKE 1 TABLET (5 MG TOTAL) BY MOUTH DAILY. Patient not taking: Reported on 10/29/2021 01/04/21 01/04/22  Shary Key, DO  Blood Pressure  Monitoring (BLOOD PRESSURE KIT) DEVI 1 kit by Does not apply route once a week. Check Blood Pressure regularly and record readings into the Babyscripts App.  Large Cuff.  DX O90.0 Patient not taking: Reported on 09/23/2020 09/21/20   Cephas Darby, MD  coconut oil OIL Apply 1 application topically as needed. Patient not taking: Reported on 10/29/2021 01/04/21   Shary Key, DO  glucose blood (ACCU-CHEK GUIDE) test strip Use 1 test strip to check blood glucose 4 times daily Patient not taking: Reported on 10/29/2021 12/24/20   Constant, Peggy, MD  triamcinolone cream (KENALOG) 0.1 % Apply 1 application topically 2 (two) times daily. Patient not taking: Reported on 10/29/2021 09/27/21   Pearson Forster, NP    Family History Family History  Problem Relation Age of Onset   Sickle cell anemia Brother     Social History Social History   Tobacco Use   Smoking status: Never   Smokeless tobacco: Never  Vaping Use   Vaping Use: Never used  Substance Use Topics   Alcohol use: No   Drug use: Not Currently    Types: Marijuana     Allergies   Patient has no known allergies.   Review of Systems Review of Systems  Neurological:  Positive for headaches.     Physical Exam Triage Vital Signs ED Triage Vitals  Enc Vitals Group     BP 02/27/23 1746 122/83     Pulse Rate 02/27/23 1746 (!) 103     Resp 02/27/23 1746 16     Temp 02/27/23 1746 98.6 F (37 C)     Temp Source 02/27/23 1746 Oral     SpO2 02/27/23 1746 97 %     Weight --      Height --      Head Circumference --      Peak Flow --      Pain Score 02/27/23 1748 6     Pain Loc --      Pain Edu? --      Excl. in [place]? --    No data found.  Updated Vital Signs BP 122/83 (BP Location: Left Arm)   Pulse (!) 103   Temp 98.6 F (37 C) (Oral)   Resp 16   LMP 01/31/2023 (Approximate)   SpO2 97%   Visual Acuity Right Eye Distance:   Left Eye Distance:   Bilateral Distance:    Right Eye Near:   Left Eye Near:     Bilateral Near:     Physical Exam Vitals reviewed.  Constitutional:      General: She is not in acute distress.    Appearance: She is not ill-appearing, toxic-appearing  or diaphoretic.  HENT:     Nose: Nose normal.     Mouth/Throat:     Mouth: Mucous membranes are moist.     Pharynx: No oropharyngeal exudate or posterior oropharyngeal erythema.  Eyes:     Extraocular Movements: Extraocular movements intact.     Conjunctiva/sclera: Conjunctivae normal.     Pupils: Pupils are equal, round, and reactive to light.  Cardiovascular:     Rate and Rhythm: Normal rate and regular rhythm.     Heart sounds: No murmur heard. Pulmonary:     Effort: Pulmonary effort is normal. No respiratory distress.     Breath sounds: Normal breath sounds. No stridor. No wheezing, rhonchi or rales.  Musculoskeletal:     Cervical back: Neck supple.  Lymphadenopathy:     Cervical: No cervical adenopathy.  Skin:    Capillary Refill: Capillary refill takes less than 2 seconds.     Coloration: Skin is not jaundiced or pale.  Neurological:     General: No focal deficit present.     Mental Status: She is alert and oriented to person, place, and time.  Psychiatric:        Behavior: Behavior normal.      UC Treatments / Results  Labs (all labs ordered are listed, but only abnormal results are displayed) Labs Reviewed - No data to display  EKG   Radiology No results found.  Procedures Procedures (including critical care time)  Medications Ordered in UC Medications  ketorolac (TORADOL) 30 MG/ML injection 30 mg (has no administration in time range)    Initial Impression / Assessment and Plan / UC Course  I have reviewed the triage vital signs and the nursing notes.  Pertinent labs & imaging results that were available during my care of the patient were reviewed by me and considered in my medical decision making (see chart for details).        Going to treat with Toradol for possible  migraine type headache.  Her last EGFR was normal.  Also she is given a prescription for Protonix to treat for possible reflux.  I have asked her to please follow-up with her primary care  Final Clinical Impressions(s) / UC Diagnoses   Final diagnoses:  Acute intractable headache, unspecified headache type  Abdominal pain, epigastric     Discharge Instructions      You have been given a shot of Toradol 30 mg today.  Pantoprazole 40 mg-take 1 daily for stomach acid.  You have been prescribed Pepcid/famotidine in the past, so if you are taking this please stop taking Pepcid/famotidine.  If you are taking ibuprofen or aspirin or naproxen, stop taking those medications.  All these medications can irritate your stomach.  If you need to take something for pain in the short-term, take Tylenol.  Please follow-up with your primary care about these issues.     ED Prescriptions     Medication Sig Dispense Auth. Provider   pantoprazole (PROTONIX) 40 MG tablet Take 1 tablet (40 mg total) by mouth daily. 30 tablet Shaneca Orne, Gwenlyn Perking, MD      I have reviewed the PDMP during this encounter.   Barrett Henle, MD 02/27/23 1830    Barrett Henle, MD 02/27/23 [phone]

## 2023-02-27 NOTE — Discharge Instructions (Signed)
You have been given a shot of Toradol 30 mg today.  Pantoprazole 40 mg-take 1 daily for stomach acid.  You have been prescribed Pepcid/famotidine in the past, so if you are taking this please stop taking Pepcid/famotidine.  If you are taking ibuprofen or aspirin or naproxen, stop taking those medications.  All these medications can irritate your stomach.  If you need to take something for pain in the short-term, take Tylenol.  Please follow-up with your primary care about these issues.

## 2023-05-05 ENCOUNTER — Encounter (HOSPITAL_COMMUNITY): Payer: Self-pay

## 2023-05-05 ENCOUNTER — Ambulatory Visit (HOSPITAL_COMMUNITY)
Admission: EM | Admit: 2023-05-05 | Discharge: 2023-05-05 | Disposition: A | Discharge status: Home / Self Care | Payer: Medicaid Other | Attending: Orthopedic Surgery | Admitting: Orthopedic Surgery

## 2023-05-05 DIAGNOSIS — K219 Gastro-esophageal reflux disease without esophagitis: Secondary | ICD-10-CM | POA: Diagnosis not present

## 2023-05-05 DIAGNOSIS — G43009 Migraine without aura, not intractable, without status migrainosus: Secondary | ICD-10-CM

## 2023-05-05 LAB — POCT URINALYSIS DIP (MANUAL ENTRY)
Bilirubin, UA: NEGATIVE
Blood, UA: NEGATIVE
Glucose, UA: NEGATIVE mg/dL
Ketones, POC UA: NEGATIVE mg/dL
Leukocytes, UA: NEGATIVE
Nitrite, UA: NEGATIVE
Protein Ur, POC: NEGATIVE mg/dL
Spec Grav, UA: 1.02 (ref 1.010–1.025)
Urobilinogen, UA: 0.2 E.U./dL
pH, UA: 5.5 (ref 5.0–8.0)

## 2023-05-05 LAB — POCT URINE PREGNANCY: Preg Test, Ur: NEGATIVE

## 2023-05-05 MED ORDER — KETOROLAC TROMETHAMINE 30 MG/ML IJ SOLN
INTRAMUSCULAR | Status: AC
  Filled 2023-05-05: qty 1

## 2023-05-05 MED ORDER — EXCEDRIN MIGRAINE 250-250-65 MG PO TABS: 1.0000 | ORAL_TABLET | Freq: Three times a day (TID) | ORAL | 0 refills | Status: DC | PRN

## 2023-05-05 MED ORDER — PANTOPRAZOLE SODIUM 40 MG PO TBEC: 40.0000 mg | DELAYED_RELEASE_TABLET | Freq: Every day | ORAL | 0 refills | Status: DC

## 2023-05-05 MED ORDER — KETOROLAC TROMETHAMINE 60 MG/2ML IM SOLN
30.0000 mg | Freq: Once | INTRAMUSCULAR | Status: AC
  Administered 2023-05-05: 30 mg via INTRAMUSCULAR

## 2023-05-05 NOTE — Discharge Instructions (Signed)
Please take headache medication as prescribed as needed.  Use antiacid medication daily.  Please establish care with primary care provider.  Please follow food choices for gastroesophageal reflux disease.  Return to the urgent care for any worsening symptoms or any urgent changes in your health

## 2023-05-05 NOTE — ED Triage Notes (Signed)
Pt is here for abdominal pain and headache x 1 week.  Denies any N/V.

## 2023-05-05 NOTE — ED Provider Notes (Signed)
[facility]    CSN: [id number] Arrival date & time: 05/05/23  1002      History   Chief Complaint No chief complaint on file.   HPI Nicole Case is a 22 y.o. female presents to the urgent care for evaluation of abdominal pain and headache.  Patient states at nighttime she will have some abdominal pain, burning.  She has had similar symptoms in the past that responded well to omeprazole but patient ran out.  She denies any nausea, vomiting, fevers, chills or body aches.  No sore throat.  Symptoms are worse after eating.  She is currently not having any pain.  She also complains of headache.  She describes gradual onset headache that is been present for 1 week.  She has not taken any Tylenol or ibuprofen for her headache.  She states she has been prescribed oxycodone in the past for sickle cell crisis which has helped some with headaches.  She was seen recently here in the urgent care and received Toradol which did resolve her headache.  Today's headache feels similar to headache most recently treated with Toradol that responded well.  She denies any vision changes, dizziness, lightheadedness.  No neck pain  HPI  Past Medical History:  Diagnosis Date   Anxiety    Gestational diabetes    Sickle cell anemia (HCC)     Patient Active Problem List   Diagnosis Date Noted   Gestational hypertension 01/04/2021   Decreased fetal movement during pregnancy in third trimester, antepartum 12/31/2020   GBS (group B Streptococcus carrier), +RV culture, currently pregnant 12/27/2020   History of COVID-19 11/24/2020   Gestational diabetes mellitus, diet-controlled 11/24/2020   Thrombocytopenia affecting pregnancy (HCC) 10/21/2020   Hypokalemia 10/20/2020   Normal IUP (intrauterine pregnancy) on prenatal ultrasound, third trimester 10/20/2020   Pneumonia due to COVID-19 virus 10/19/2020   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   Carrier of genetic disorder 07/10/2020   Pap  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 07/06/2020   Trichomonal vaginitis during pregnancy in first trimester 07/03/2020   Anemia 07/03/2020   Maternal morbid obesity, antepartum (HCC) 06/29/2020   Supervision of high risk pregnancy, antepartum 06/28/2020   Central chest pain    Sickle cell anemia with crisis (HCC) 06/20/2020   Sore throat 10/06/2019   Fever 10/06/2019   Urinary tract infection symptoms 10/06/2019   Sickle-cell disease with pain (HCC) 08/01/2017   Sickle cell disease, type [place], with unspecified crisis (HCC) 07/31/2017   Sickle cell anemia with pain (HCC) 05/28/2016   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 Authorizing Provider  albuterol (VENTOLIN HFA) 108 (90 Base) MCG/ACT inhaler Inhale 2 puffs into the lungs every 6 (  six) hours as needed for wheezing or shortness of breath.   Yes [provider]  aspirin-acetaminophen-caffeine (EXCEDRIN MIGRAINE) [phone] MG tablet Take 1 tablet by mouth every 8 (eight) hours as needed for headache. 05/05/23  Yes Amador Cunas C, PA-C  coconut oil OIL Apply 1 application topically as needed. 01/04/21  Yes Paige, Turkey J, DO  glucose blood (ACCU-CHEK GUIDE) test strip Use 1 test strip to check blood glucose 4 times daily 12/24/20  Yes Constant, Peggy, MD  triamcinolone cream (KENALOG) 0.1 % Apply 1 application topically 2 (two) times daily. 09/27/21  Yes Ivette Loyal, NP  pantoprazole (PROTONIX) 40 MG tablet Take 1 tablet (40 mg total) by mouth  daily. 05/05/23   Evon Slack, PA-C    Family History Family History  Problem Relation Age of Onset   Sickle cell anemia Brother     Social History Social History   Tobacco Use   Smoking status: Never   Smokeless tobacco: Never  Vaping Use   Vaping Use: Never used  Substance Use Topics   Alcohol use: No   Drug use: Not Currently    Types: Marijuana     Allergies   Patient has no known allergies.   Review of Systems Review of Systems   Physical Exam Triage Vital Signs ED Triage Vitals [05/05/23 1029]  Enc Vitals Group     BP (!) 143/98     Pulse Rate 76     Resp 18     Temp 98.2 F (36.8 C)     Temp Source Oral     SpO2 97 %     Weight      Height      Head Circumference      Peak Flow      Pain Score      Pain Loc      Pain Edu?      Excl. in GC?    No data found.  Updated Vital Signs BP (!) 143/98 (BP Location: Left Arm)   Pulse 76   Temp 98.2 F (36.8 C) (Oral)   Resp 18   LMP 04/23/2023 (Approximate)   SpO2 97%   Visual Acuity Right Eye Distance:   Left Eye Distance:   Bilateral Distance:    Right Eye Near:   Left Eye Near:    Bilateral Near:     Physical Exam Constitutional:      Appearance: She is well-developed.  HENT:     Head: Normocephalic and atraumatic.  Eyes:     Conjunctiva/sclera: Conjunctivae normal.  Cardiovascular:     Rate and Rhythm: Normal rate.  Pulmonary:     Effort: Pulmonary effort is normal. No respiratory distress.  Abdominal:     General: Bowel sounds are normal. There is no distension.     Tenderness: There is no abdominal tenderness. There is no right CVA tenderness, left CVA tenderness or guarding.  Musculoskeletal:        General: Normal range of motion.     Cervical back: Normal range of motion.  Skin:    General: Skin is warm.     Findings: No rash.  Neurological:     General: No focal deficit present.     Mental Status: She is alert and oriented to person, place, and time. Mental status  is at baseline.     Cranial Nerves: No cranial nerve deficit.     Motor: No weakness.     Gait: Gait normal.  Psychiatric:  Behavior: Behavior normal.        Thought Content: Thought content normal.      UC Treatments / Results  Labs (all labs ordered are listed, but only abnormal results are displayed) Labs Reviewed  POCT URINE PREGNANCY  POCT URINALYSIS DIP (MANUAL ENTRY)    EKG   Radiology No results found.  Procedures Procedures (including critical care time)  Medications Ordered in UC Medications  ketorolac (TORADOL) injection 30 mg (has no administration in time range)    Initial Impression / Assessment and Plan / UC Course  I have reviewed the triage vital signs and the nursing notes.  Pertinent labs & imaging results that were available during my care of the patient were reviewed by me and considered in my medical decision making (see chart for details).     22 year old with headache and abdominal discomfort.  Her headache is consistent with migraine headache, her normal headaches.  She has had relief with Toradol.  She is given Toradol today to see relief.  She is given Excedrin migraine to try over-the-counter medication at home if headaches recur.  She understands signs symptoms return to the ER for.  She also has some symptoms consistent with peptic ulcer disease/GERD.  Will try omeprazole daily, this has helped her in the past.  She is given information on foods to avoid.  She understands signs and symptoms return to the ER for.  Recommend she follow-up with PCP to discuss long-term management of her headaches and reflux Final Clinical Impressions(s) / UC Diagnoses   Final diagnoses:  Migraine without aura and without status migrainosus, not intractable  Gastroesophageal reflux disease without esophagitis     Discharge Instructions      Please take headache medication as prescribed as needed.  Use antiacid medication daily.  Please establish care  with primary care provider.  Please follow food choices for gastroesophageal reflux disease.  Return to the urgent care for any worsening symptoms or any urgent changes in your health   ED Prescriptions     Medication Sig Dispense Auth. Provider   pantoprazole (PROTONIX) 40 MG tablet Take 1 tablet (40 mg total) by mouth daily. 30 tablet Evon Slack PA-C   aspirin-acetaminophen-caffeine (EXCEDRIN MIGRAINE) [phone] MG tablet Take 1 tablet by mouth every 8 (eight) hours as needed for headache. 30 tablet Evon Slack, PA-C      PDMP not reviewed this encounter.   Evon Slack, PA-C 05/05/23 1109

## 2023-05-07 ENCOUNTER — Ambulatory Visit: Payer: Medicaid Other | Admitting: Obstetrics

## 2023-05-10 ENCOUNTER — Other Ambulatory Visit: Payer: Self-pay

## 2023-05-10 ENCOUNTER — Encounter (HOSPITAL_COMMUNITY): Payer: Self-pay

## 2023-05-10 ENCOUNTER — Emergency Department (HOSPITAL_COMMUNITY)
Admission: EM | Admit: 2023-05-10 | Discharge: 2023-05-11 | Disposition: A | Discharge status: Home / Self Care | Payer: Medicaid Other | Attending: Emergency Medicine | Admitting: Emergency Medicine

## 2023-05-10 DIAGNOSIS — Z79899 Other long term (current) drug therapy: Secondary | ICD-10-CM | POA: Insufficient documentation

## 2023-05-10 DIAGNOSIS — D5700 Hb-SS disease with crisis, unspecified: Secondary | ICD-10-CM

## 2023-05-10 DIAGNOSIS — R0602 Shortness of breath: Secondary | ICD-10-CM | POA: Diagnosis not present

## 2023-05-10 DIAGNOSIS — R0789 Other chest pain: Secondary | ICD-10-CM | POA: Diagnosis not present

## 2023-05-10 NOTE — ED Triage Notes (Signed)
Patient reports sickle cell pain in her left arm and left leg for 3 days.

## 2023-05-11 ENCOUNTER — Emergency Department (HOSPITAL_COMMUNITY): Payer: Medicaid Other

## 2023-05-11 DIAGNOSIS — R0602 Shortness of breath: Secondary | ICD-10-CM | POA: Diagnosis not present

## 2023-05-11 LAB — CBC WITH DIFFERENTIAL/PLATELET
Abs Immature Granulocytes: 0.02 10*3/uL (ref 0.00–0.07)
Basophils Absolute: 0 10*3/uL (ref 0.0–0.1)
Basophils Relative: 0 %
Eosinophils Absolute: 0.1 10*3/uL (ref 0.0–0.5)
Eosinophils Relative: 2 %
HCT: 29.1 % — ABNORMAL LOW (ref 36.0–46.0)
Hemoglobin: 10.4 g/dL — ABNORMAL LOW (ref 12.0–15.0)
Immature Granulocytes: 0 %
Lymphocytes Relative: 38 %
Lymphs Abs: 2.4 10*3/uL (ref 0.7–4.0)
MCH: 24.5 pg — ABNORMAL LOW (ref 26.0–34.0)
MCHC: 35.7 g/dL (ref 30.0–36.0)
MCV: 68.5 fL — ABNORMAL LOW (ref 80.0–100.0)
Monocytes Absolute: 0.6 10*3/uL (ref 0.1–1.0)
Monocytes Relative: 9 %
Neutro Abs: 3.2 10*3/uL (ref 1.7–7.7)
Neutrophils Relative %: 51 %
Platelets: 143 10*3/uL — ABNORMAL LOW (ref 150–400)
RBC: 4.25 MIL/uL (ref 3.87–5.11)
RDW: 18.6 % — ABNORMAL HIGH (ref 11.5–15.5)
WBC: 6.2 10*3/uL (ref 4.0–10.5)
nRBC: 0 % (ref 0.0–0.2)

## 2023-05-11 LAB — TROPONIN I (HIGH SENSITIVITY)
Troponin I (High Sensitivity): 3 ng/L (ref <18)
Troponin I (High Sensitivity): 4 ng/L (ref <18)

## 2023-05-11 LAB — COMPREHENSIVE METABOLIC PANEL
ALT: 19 U/L (ref 0–44)
AST: 22 U/L (ref 15–41)
Albumin: 3.6 g/dL (ref 3.5–5.0)
Alkaline Phosphatase: 42 U/L (ref 38–126)
Anion gap: 10 (ref 5–15)
BUN: 10 mg/dL (ref 6–20)
CO2: 23 mmol/L (ref 22–32)
Calcium: 8.4 mg/dL — ABNORMAL LOW (ref 8.9–10.3)
Chloride: 104 mmol/L (ref 98–111)
Creatinine, Ser: 0.85 mg/dL (ref 0.44–1.00)
GFR, Estimated: >60 mL/min (ref >60)
Glucose, Bld: 94 mg/dL (ref 70–99)
Potassium: 3.4 mmol/L — ABNORMAL LOW (ref 3.5–5.1)
Sodium: 137 mmol/L (ref 135–145)
Total Bilirubin: 1.4 mg/dL — ABNORMAL HIGH (ref 0.3–1.2)
Total Protein: 6.6 g/dL (ref 6.5–8.1)

## 2023-05-11 LAB — RETICULOCYTES
Immature Retic Fract: 21.5 % — ABNORMAL HIGH (ref 2.3–15.9)
RBC.: 4.25 MIL/uL (ref 3.87–5.11)
Retic Count, Absolute: 94.4 10*3/uL (ref 19.0–186.0)
Retic Ct Pct: 2.2 % (ref 0.4–3.1)

## 2023-05-11 LAB — CK: Total CK: 320 U/L — ABNORMAL HIGH (ref 38–234)

## 2023-05-11 LAB — HCG, QUANTITATIVE, PREGNANCY: hCG, Beta Chain, Quant, S: <1 m[IU]/mL (ref <5)

## 2023-05-11 MED ORDER — SODIUM CHLORIDE 0.9 % IV SOLN
12.5000 mg | Freq: Once | INTRAVENOUS | Status: AC
  Administered 2023-05-11: 12.5 mg via INTRAVENOUS
  Filled 2023-05-11: qty 12.5

## 2023-05-11 MED ORDER — OXYCODONE-ACETAMINOPHEN 5-325 MG PO TABS
2.0000 | ORAL_TABLET | Freq: Once | ORAL | Status: AC
  Administered 2023-05-11: 2 via ORAL
  Filled 2023-05-11: qty 2

## 2023-05-11 MED ORDER — KETOROLAC TROMETHAMINE 15 MG/ML IJ SOLN
15.0000 mg | INTRAMUSCULAR | Status: AC
  Administered 2023-05-11: 15 mg via INTRAVENOUS
  Filled 2023-05-11: qty 1

## 2023-05-11 MED ORDER — HYDROMORPHONE HCL 1 MG/ML IJ SOLN
1.0000 mg | INTRAMUSCULAR | Status: AC
  Administered 2023-05-11: 1 mg via INTRAVENOUS
  Filled 2023-05-11: qty 1

## 2023-05-11 MED ORDER — POTASSIUM CHLORIDE CRYS ER 20 MEQ PO TBCR
40.0000 meq | EXTENDED_RELEASE_TABLET | Freq: Once | ORAL | Status: DC
  Filled 2023-05-11: qty 2

## 2023-05-11 MED ORDER — OXYCODONE-ACETAMINOPHEN 5-325 MG PO TABS: 1.0000 | ORAL_TABLET | Freq: Three times a day (TID) | ORAL | 0 refills | Status: DC | PRN

## 2023-05-11 MED ORDER — DEXTROSE-SODIUM CHLORIDE 5-0.45 % IV SOLN: INTRAVENOUS | Status: DC

## 2023-05-11 MED ORDER — SODIUM CHLORIDE 0.9 % IV BOLUS
1000.0000 mL | Freq: Once | INTRAVENOUS | Status: AC
  Administered 2023-05-11: 1000 mL via INTRAVENOUS

## 2023-05-11 MED ORDER — ONDANSETRON HCL 4 MG/2ML IJ SOLN
4.0000 mg | INTRAMUSCULAR | Status: DC | PRN
  Administered 2023-05-11: 4 mg via INTRAVENOUS
  Filled 2023-05-11: qty 2

## 2023-05-11 MED ORDER — HYDROMORPHONE HCL 1 MG/ML IJ SOLN
0.5000 mg | INTRAMUSCULAR | Status: AC
  Administered 2023-05-11: 0.5 mg via INTRAVENOUS
  Filled 2023-05-11: qty 1

## 2023-05-11 NOTE — ED Provider Notes (Signed)
[place] EMERGENCY DEPARTMENT AT [facility] Provider Note   CSN: [id number] Arrival date & time: 05/10/23  2300     History  Chief Complaint  Patient presents with   Sickle Cell Pain Crisis    Nicole Case is a 22 y.o. female.  Patient with a history of sickle cell disease presenting with pain to her left arm and left leg consistent with previous sickle cell crises.  Pain has been constant for the past 3 days.  She is currently prescribed oxycodone at home but has not had any for several months since she was last in the hospital.  States she occasionally gets pain to her arms and her legs sometimes on the left side sometimes on the right but this feels similar to previous sickle cell crises.  No trauma.  No chest pain or shortness of breath.  No fever, chills, nausea or vomiting.  No cough.  No pain with urination or blood in the urine.  No focal weakness, numbness or tingling.  Pain radiates from her entire left arm and entire left leg all the way down to her fingers and toes.  Denies any neck or back pain.  The history is provided by the patient.  Sickle Cell Pain Crisis Associated symptoms: no congestion, no cough, no fever, no headaches, no nausea, no shortness of breath and no vomiting        Home Medications Prior to Admission medications   Medication Sig Start Date End Date Taking? Authorizing Provider  albuterol (VENTOLIN HFA) 108 (90 Base) MCG/ACT inhaler Inhale 2 puffs into the lungs every 6 (six) hours as needed for wheezing or shortness of breath.    [provider]  aspirin-acetaminophen-caffeine (EXCEDRIN MIGRAINE) [phone] MG tablet Take 1 tablet by mouth every 8 (eight) hours as needed for headache. 05/05/23   Evon Slack, PA-C  coconut oil OIL Apply 1 application topically as needed. 01/04/21   Cora Collum, DO  glucose blood (ACCU-CHEK GUIDE) test strip Use 1 test strip to check blood glucose 4 times daily 12/24/20   Constant, Peggy,  MD  pantoprazole (PROTONIX) 40 MG tablet Take 1 tablet (40 mg total) by mouth daily. 05/05/23   Evon Slack, PA-C  triamcinolone cream (KENALOG) 0.1 % Apply 1 application topically 2 (two) times daily. 09/27/21   Ivette Loyal, NP      Allergies    Patient has no known allergies.    Review of Systems   Review of Systems  Constitutional:  Negative for activity change, appetite change and fever.  HENT:  Negative for congestion and rhinorrhea.   Respiratory:  Negative for cough, chest tightness and shortness of breath.   Gastrointestinal:  Negative for abdominal pain, nausea and vomiting.  Genitourinary:  Negative for dysuria and hematuria.  Musculoskeletal:  Positive for arthralgias and myalgias.  Skin:  Negative for rash.  Neurological:  Negative for dizziness, weakness and headaches.   all other systems are negative except as noted in the HPI and PMH.    Physical Exam Updated Vital Signs BP (!) 132/90 (BP Location: Left Arm)   Pulse 88   Temp 99.3 F (37.4 C) (Oral)   Resp 18   Ht 5\' 7"  (1.702 m)   Wt 88.5 kg   LMP 04/23/2023 (Approximate)   SpO2 100%   BMI 30.54 kg/m  Physical Exam Vitals and nursing note reviewed.  Constitutional:      General: She is not in acute distress.  Appearance: She is well-developed.  HENT:     Head: Normocephalic and atraumatic.     Mouth/Throat:     Pharynx: No oropharyngeal exudate.  Eyes:     Conjunctiva/sclera: Conjunctivae normal.     Pupils: Pupils are equal, round, and reactive to light.  Neck:     Comments: No meningismus. Cardiovascular:     Rate and Rhythm: Normal rate and regular rhythm.     Heart sounds: Normal heart sounds. No murmur heard. Pulmonary:     Effort: Pulmonary effort is normal. No respiratory distress.     Breath sounds: Normal breath sounds.  Chest:     Chest wall: No tenderness.  Abdominal:     Palpations: Abdomen is soft.     Tenderness: There is no abdominal tenderness. There is no guarding or  rebound.  Musculoskeletal:        General: Tenderness present. Normal range of motion.     Cervical back: Normal range of motion and neck supple.     Comments: Full range of motion of left shoulder, elbow and wrist.  Full range of motion of left knee, hip and ankle.  No warmth erythema over joints.  Compartments are soft.  Left arm and left leg are diffusely tender.  Intact distal pulses  Skin:    General: Skin is warm.  Neurological:     Mental Status: She is alert and oriented to person, place, and time.     Cranial Nerves: No cranial nerve deficit.     Motor: No abnormal muscle tone.     Coordination: Coordination normal.     Comments:  5/5 strength throughout. CN 2-12 intact.Equal grip strength.   Psychiatric:        Behavior: Behavior normal.     ED Results / Procedures / Treatments   Labs (all labs ordered are listed, but only abnormal results are displayed) Labs Reviewed  COMPREHENSIVE METABOLIC PANEL - Abnormal; Notable for the following components:      Result Value   Potassium 3.4 (*)    Calcium 8.4 (*)    Total Bilirubin 1.4 (*)    All other components within normal limits  CBC WITH DIFFERENTIAL/PLATELET - Abnormal; Notable for the following components:   Hemoglobin 10.4 (*)    HCT 29.1 (*)    MCV 68.5 (*)    MCH 24.5 (*)    RDW 18.6 (*)    Platelets 143 (*)    All other components within normal limits  RETICULOCYTES - Abnormal; Notable for the following components:   Immature Retic Fract 21.5 (*)    All other components within normal limits  CK - Abnormal; Notable for the following components:   Total CK 320 (*)    All other components within normal limits  HCG, QUANTITATIVE, PREGNANCY  URINALYSIS, ROUTINE W REFLEX MICROSCOPIC  TROPONIN I (HIGH SENSITIVITY)  TROPONIN I (HIGH SENSITIVITY)    EKG EKG Interpretation  Date/Time:  Thursday May 10 2023 23:19:08 EDT Ventricular Rate:  83 PR Interval:  154 QRS Duration: 81 QT Interval:  357 QTC  Calculation: 420 R Axis:   54 Text Interpretation: Sinus rhythm No significant change was found Confirmed by Glynn Octave [phone]) on 05/11/2023 1:51:29 AM  Radiology DG Chest 2 View  Result Date: 05/11/2023 CLINICAL DATA:  Sickle cell crisis with shortness of breath EXAM: CHEST - 2 VIEW COMPARISON:  03/08/2022 FINDINGS: The heart size and mediastinal contours are within normal limits. Both lungs are clear. The visualized skeletal structures are  unremarkable. IMPRESSION: No active cardiopulmonary disease. Electronically Signed   By: Alcide Clever M.D.   On: 05/11/2023 01:41    Procedures Procedures    Medications Ordered in ED Medications  dextrose 5 % and 0.45 % NaCl infusion (has no administration in time range)  HYDROmorphone (DILAUDID) injection 0.5 mg (has no administration in time range)  HYDROmorphone (DILAUDID) injection 1 mg (has no administration in time range)  HYDROmorphone (DILAUDID) injection 1 mg (has no administration in time range)  ondansetron (ZOFRAN) injection 4 mg (has no administration in time range)  ketorolac (TORADOL) 15 MG/ML injection 15 mg (has no administration in time range)    ED Course/ Medical Decision Making/ A&P                             Medical Decision Making Amount and/or Complexity of Data Reviewed Labs: ordered. Decision-making details documented in ED Course. Radiology: ordered and independent interpretation performed. Decision-making details documented in ED Course. ECG/medicine tests: ordered and independent interpretation performed. Decision-making details documented in ED Course.  Risk Prescription drug management.   Typical sickle cell pain involving left arm and left leg.  No chest pain or shortness of breath.  Vitals are stable, no shortness of breath.  No fever.  Will treat per sickle cell protocol with pain and nausea medications.  Check labs.  Hemoglobin stable at 10.1.  Reticulocyte count is adequate.  Chest x-ray negative  for infiltrate or pneumothorax.  Results reviewed interpreted by me.  Low suspicion for ACS or PE.  CK minimally elevated at 320.  Creatinine is normal.  Reticulocyte count is adequate.  Patient's pain has improved with multiple rounds of medications in the ED.  Low suspicion for DVT given no asymmetric swelling.  Compartments are soft.  No erythema or edema.  No chest pain or shortness of breath.  She states she does not have a sickle cell doctor that she sees as an outpatient. Review of narcotic database shows no recent prescriptions for pain medication.  Feels improved and is requesting discharge home.  Declines admission for pain control at this time.  Will give short course of pain medication referral sickle cell clinic.  No evidence of acute chest syndrome.  Follow-up with sickle cell clinic.  Return precautions given.       Final Clinical Impression(s) / ED Diagnoses Final diagnoses:  Sickle cell pain crisis [facility])    Rx / DC Orders ED Discharge Orders     None         Talecia Sherlin, Jeannett Senior, MD 05/11/23 [phone]

## 2023-05-11 NOTE — Discharge Instructions (Signed)
Take the pain medication as prescribed.  Follow-up with the sickle cell clinic for further evaluation.  Return to the ED with new or worsening symptoms.

## 2023-05-11 NOTE — ED Notes (Signed)
Lab called to add on trop and CK

## 2023-05-17 ENCOUNTER — Ambulatory Visit: Payer: Self-pay | Admitting: Nurse Practitioner

## 2023-07-20 DIAGNOSIS — R0602 Shortness of breath: Secondary | ICD-10-CM | POA: Diagnosis not present

## 2023-07-20 DIAGNOSIS — R079 Chest pain, unspecified: Secondary | ICD-10-CM | POA: Diagnosis not present

## 2023-07-20 DIAGNOSIS — R233 Spontaneous ecchymoses: Secondary | ICD-10-CM | POA: Diagnosis not present

## 2023-07-21 DIAGNOSIS — R0602 Shortness of breath: Secondary | ICD-10-CM | POA: Diagnosis not present

## 2023-07-21 DIAGNOSIS — R079 Chest pain, unspecified: Secondary | ICD-10-CM | POA: Diagnosis not present

## 2023-07-21 DIAGNOSIS — R233 Spontaneous ecchymoses: Secondary | ICD-10-CM | POA: Diagnosis not present

## 2023-09-20 ENCOUNTER — Telehealth: Payer: 59 | Admitting: Family Medicine

## 2023-09-20 DIAGNOSIS — R109 Unspecified abdominal pain: Secondary | ICD-10-CM

## 2023-09-20 DIAGNOSIS — N898 Other specified noninflammatory disorders of vagina: Secondary | ICD-10-CM

## 2023-09-20 NOTE — Progress Notes (Signed)
Because belly and back pain with new vaginal discharge needs testing your condition warrants further evaluation and I recommend that you be seen in a face to face visit.   NOTE: There will be NO CHARGE for this eVisit   If you are having a true medical emergency please call 911.

## 2023-10-08 ENCOUNTER — Other Ambulatory Visit: Payer: Self-pay | Admitting: Family Medicine

## 2023-10-08 ENCOUNTER — Encounter: Payer: Self-pay | Admitting: Nurse Practitioner

## 2023-10-08 ENCOUNTER — Ambulatory Visit (INDEPENDENT_AMBULATORY_CARE_PROVIDER_SITE_OTHER): Payer: 59 | Admitting: Nurse Practitioner

## 2023-10-08 VITALS — BP 126/79 | HR 95 | Ht 67.0 in | Wt 222.0 lb

## 2023-10-08 DIAGNOSIS — D5700 Hb-SS disease with crisis, unspecified: Secondary | ICD-10-CM | POA: Diagnosis not present

## 2023-10-08 DIAGNOSIS — D57219 Sickle-cell/Hb-C disease with crisis, unspecified: Secondary | ICD-10-CM

## 2023-10-08 MED ORDER — OXYCODONE-ACETAMINOPHEN 5-325 MG PO TABS
1.0000 | ORAL_TABLET | Freq: Three times a day (TID) | ORAL | 0 refills | Status: DC | PRN
Start: 2023-10-08 — End: 2023-10-08

## 2023-10-08 MED ORDER — IBUPROFEN 800 MG PO TABS
800.0000 mg | ORAL_TABLET | Freq: Three times a day (TID) | ORAL | 0 refills | Status: DC | PRN
Start: 2023-10-08 — End: 2024-07-28

## 2023-10-08 MED ORDER — OXYCODONE-ACETAMINOPHEN 5-325 MG PO TABS
1.0000 | ORAL_TABLET | ORAL | 0 refills | Status: DC | PRN
Start: 2023-10-08 — End: 2023-10-08

## 2023-10-08 MED ORDER — OXYCODONE-ACETAMINOPHEN 5-325 MG PO TABS: 1.0000 | ORAL_TABLET | Freq: Three times a day (TID) | ORAL | 0 refills | Status: DC | PRN

## 2023-10-08 MED ORDER — OXYCODONE-ACETAMINOPHEN 5-325 MG PO TABS: 1.0000 | ORAL_TABLET | ORAL | 0 refills | Status: AC | PRN

## 2023-10-08 NOTE — Patient Instructions (Addendum)
1. Sickle cell disease, type [place], with unspecified crisis (HCC)  - Sickle Cell Panel - Drug Screen 11 w/Conf, Ser  Follow up:  Follow up with [place]

## 2023-10-08 NOTE — Progress Notes (Signed)
Subjective   Patient ID: Nicole Case, female    DOB: 2001-08-03, 22 y.o.   MRN: [id number]  No chief complaint on file.   Referring provider: Massie Maroon, FNP  Nicole Case is a 22 y.o. female with Past Medical History: No date: Anxiety No date: Gestational diabetes No date: Sickle cell anemia (HCC)   HPI  Patient presents today to establish care.  Patient does have a history of sickle cell disease.  Her last crisis was this past June.  She is not currently on pain medicine but states that she has been feeling like she is having a crisis for the past few days.  She is having leg and arm pain.  Labs today and order short course of Percocet. Denies f/c/s, n/v/d, hemoptysis, PND, leg swelling Denies chest pain or edema      No Known Allergies  Immunization History  Administered Date(s) Administered   Influenza,inj,Quad PF,6+ Mos 11/24/2020   Influenza-Unspecified 08/09/2023    Tobacco History: Social History   Tobacco Use  Smoking Status Never  Smokeless Tobacco Never   Counseling given: Not Answered   Outpatient Encounter Medications as of 10/08/2023  Medication Sig   coconut oil OIL Apply 1 application topically as needed.   glucose blood (ACCU-CHEK GUIDE) test strip Use 1 test strip to check blood glucose 4 times daily   ibuprofen (ADVIL) 800 MG tablet Take 1 tablet (800 mg total) by mouth every 8 (eight) hours as needed.   oxyCODONE-acetaminophen (PERCOCET/ROXICET) 5-325 MG tablet Take 1 tablet by mouth every 4 (four) hours as needed for up to 3 days for severe pain (pain score 7-10).   pantoprazole (PROTONIX) 40 MG tablet Take 1 tablet (40 mg total) by mouth daily.   triamcinolone cream (KENALOG) 0.1 % Apply 1 application topically 2 (two) times daily.   [DISCONTINUED] aspirin-acetaminophen-caffeine (EXCEDRIN MIGRAINE) 250-250-65 MG tablet Take 1 tablet by mouth every 8 (eight) hours as needed for headache.   [DISCONTINUED]  oxyCODONE-acetaminophen (PERCOCET/ROXICET) 5-325 MG tablet Take 1 tablet by mouth every 8 (eight) hours as needed for severe pain.   [DISCONTINUED] oxyCODONE-acetaminophen (PERCOCET/ROXICET) 5-325 MG tablet Take 1 tablet by mouth every 8 (eight) hours as needed for up to 3 days for severe pain (pain score 7-10).   [DISCONTINUED] oxyCODONE-acetaminophen (PERCOCET/ROXICET) 5-325 MG tablet Take 1 tablet by mouth every 8 (eight) hours as needed for up to 3 days for severe pain (pain score 7-10).   [DISCONTINUED] oxyCODONE-acetaminophen (PERCOCET) 5-325 MG tablet Take 1 tablet by mouth every 4 (four) hours as needed for up to 3 days for severe pain (pain score 7-10).   [DISCONTINUED] oxyCODONE-acetaminophen (PERCOCET/ROXICET) 5-325 MG tablet Take 1 tablet by mouth every 8 (eight) hours as needed for up to 3 days for severe pain (pain score 7-10).   No facility-administered encounter medications on file as of 10/08/2023.    Review of Systems  Review of Systems  Constitutional: Negative.   HENT: Negative.    Cardiovascular: Negative.   Gastrointestinal: Negative.   Allergic/Immunologic: Negative.   Neurological: Negative.   Psychiatric/Behavioral: Negative.       Objective:   BP 126/79 (BP Location: Right Arm, Patient Position: Sitting)   Pulse 95   Ht 5\' 7"  (1.702 m)   Wt 222 lb (100.7 kg)   SpO2 99%   BMI 34.77 kg/m   Wt Readings from Last 5 Encounters:  10/08/23 222 lb (100.7 kg)  05/10/23 195 lb (88.5 kg)  01/16/23 195 lb (88.5  kg)  10/28/21 195 lb (88.5 kg)  12/30/20 275 lb (124.7 kg)     Physical Exam Vitals and nursing note reviewed.  Constitutional:      General: She is not in acute distress.    Appearance: She is well-developed.  Cardiovascular:     Rate and Rhythm: Normal rate and regular rhythm.  Pulmonary:     Effort: Pulmonary effort is normal.     Breath sounds: Normal breath sounds.  Neurological:     Mental Status: She is alert and oriented to person, place,  and time.       Assessment & Plan:   Sickle cell disease, type [place], with unspecified crisis (HCC) -     Sickle Cell Panel -     Drug Screen 11 w/Conf, Ser  Sickle cell pain crisis (HCC) -     Ibuprofen; Take 1 tablet (800 mg total) by mouth every 8 (eight) hours as needed.  Dispense: 30 tablet; Refill: 0  Sickle cell anemia with pain (HCC)  Other orders -     oxyCODONE-Acetaminophen; Take 1 tablet by mouth every 4 (four) hours as needed for up to 3 days for severe pain (pain score 7-10).  Dispense: 9 tablet; Refill: 0     Return in about 4 weeks (around 11/05/2023) for [employer].   Ivonne Andrew, NP 10/08/2023

## 2023-10-17 LAB — CMP14+CBC/D/PLT+FER+RETIC+V...
ALT: 26 IU/L (ref 0–32)
AST: 26 [IU]/L (ref 0–40)
Albumin: 3.7 g/dL — ABNORMAL LOW (ref 4.0–5.0)
Alkaline Phosphatase: 70 IU/L (ref 44–121)
BUN/Creatinine Ratio: 10 (ref 9–23)
BUN: 7 mg/dL (ref 6–20)
Basophils Absolute: 0 10*3/uL (ref 0.0–0.2)
Basos: 1 %
Bilirubin Total: 0.8 mg/dL (ref 0.0–1.2)
CO2: 22 mmol/L (ref 20–29)
Calcium: 8.3 mg/dL — ABNORMAL LOW (ref 8.7–10.2)
Chloride: 107 mmol/L — ABNORMAL HIGH (ref 96–106)
Creatinine, Ser: 0.68 mg/dL (ref 0.57–1.00)
EOS (ABSOLUTE): 0.1 10*3/uL (ref 0.0–0.4)
Eos: 2 %
Ferritin: 47 ng/mL (ref 15–150)
Globulin, Total: 2.4 g/dL (ref 1.5–4.5)
Glucose: 109 mg/dL — ABNORMAL HIGH (ref 70–99)
Hematocrit: 30.6 % — ABNORMAL LOW (ref 34.0–46.6)
Hemoglobin: 9.9 g/dL — ABNORMAL LOW (ref 11.1–15.9)
Immature Grans (Abs): 0 10*3/uL (ref 0.0–0.1)
Immature Granulocytes: 1 %
Lymphocytes Absolute: 1.5 10*3/uL (ref 0.7–3.1)
Lymphs: 24 %
MCH: 25.1 pg — ABNORMAL LOW (ref 26.6–33.0)
MCHC: 32.4 g/dL (ref 31.5–35.7)
MCV: 78 fL — ABNORMAL LOW (ref 79–97)
Monocytes Absolute: 0.5 10*3/uL (ref 0.1–0.9)
Monocytes: 7 %
Neutrophils Absolute: 4.3 10*3/uL (ref 1.4–7.0)
Neutrophils: 65 %
Platelets: 132 10*3/uL — ABNORMAL LOW (ref 150–450)
Potassium: 3.9 mmol/L (ref 3.5–5.2)
RBC: 3.95 x10E6/uL (ref 3.77–5.28)
RDW: 19.6 % — ABNORMAL HIGH (ref 11.7–15.4)
Retic Ct Pct: 2.8 % — ABNORMAL HIGH (ref 0.6–2.6)
Sodium: 141 mmol/L (ref 134–144)
Total Protein: 6.1 g/dL (ref 6.0–8.5)
Vit D, 25-Hydroxy: 11.1 ng/mL — ABNORMAL LOW (ref 30.0–100.0)
WBC: 6.5 10*3/uL (ref 3.4–10.8)
eGFR: 125 mL/min/{1.73_m2} (ref >59)

## 2023-10-17 LAB — THC,MS,WB/SP RFX
Cannabidiol: NEGATIVE ng/mL
Cannabinoid Confirmation: POSITIVE
Carboxy-THC: 40.5 ng/mL
Hydroxy-THC: 1.3 ng/mL
Tetrahydrocannabinol(THC): 1.8 ng/mL

## 2023-10-17 LAB — DRUG SCREEN 11 W/CONF, SE
Amphetamines, IA: NEGATIVE ng/mL
Barbiturates, IA: NEGATIVE ug/mL
Benzodiazepines, IA: NEGATIVE ng/mL
Cocaine & Metabolite, IA: NEGATIVE ng/mL
Ethyl Alcohol, Enz: NEGATIVE g/dL
Methadone, IA: NEGATIVE ng/mL
Opiates, IA: NEGATIVE ng/mL
Oxycodones, IA: NEGATIVE ng/mL
Phencyclidine, IA: NEGATIVE ng/mL
Propoxyphene, IA: NEGATIVE ng/mL
THC(Marijuana) Metabolite, IA: POSITIVE ng/mL — AB

## 2023-10-22 ENCOUNTER — Other Ambulatory Visit (HOSPITAL_COMMUNITY)
Admission: RE | Admit: 2023-10-22 | Discharge: 2023-10-22 | Disposition: A | Discharge status: Home / Self Care | Payer: 59 | Source: Ambulatory Visit | Attending: Obstetrics and Gynecology | Admitting: Obstetrics and Gynecology

## 2023-10-22 ENCOUNTER — Encounter: Payer: Self-pay | Admitting: Obstetrics and Gynecology

## 2023-10-22 ENCOUNTER — Ambulatory Visit (INDEPENDENT_AMBULATORY_CARE_PROVIDER_SITE_OTHER): Payer: 59 | Admitting: Obstetrics and Gynecology

## 2023-10-22 VITALS — BP 123/72 | HR 74 | Ht 66.0 in | Wt 225.6 lb

## 2023-10-22 DIAGNOSIS — Z124 Encounter for screening for malignant neoplasm of cervix: Secondary | ICD-10-CM

## 2023-10-22 DIAGNOSIS — M7918 Myalgia, other site: Secondary | ICD-10-CM | POA: Diagnosis not present

## 2023-10-22 NOTE — Progress Notes (Unsigned)
   RETURN GYNECOLOGY VISIT  Subjective:  Nicole Case is a 22 y.o. G1P1001 with LMP 10/10/23 presenting for pelvic pain/pressure  2+ weeks of pelvic pressure and feeling like her cervix is low. Cannot feel her cervix in her vagina with her fingers/hand. Not currently sexually active, but no history of dyspareunia. No bowel or bladder symptoms. No fevers/chills, nausea/vomiting, or abnormal discharge.   Works a job with a lot of lifting/bending  Due for pap  Objective:   Vitals:   10/22/23 1457  BP: 123/72  Pulse: 74  Weight: 225 lb 9.6 oz (102.3 kg)  Height: 5\' 6"  (1.676 m)    General:  Alert, oriented and cooperative. Patient is in no acute distress.  Skin: Skin is warm and dry. No rash noted.   Cardiovascular: Normal heart rate noted  Respiratory: Normal respiratory effort, no problems with respiration noted  Abdomen: Soft, non-tender, non-distended   Pelvic: NEFG. Pain/pressure reproduced with palpation of levators/obturators. Normal cervix, uterus, adnexa - all non tender. No significant descensus  Exam performed in the presence of a chaperone  Assessment and Plan:  Nicole Case is a 22 y.o. with pelvic pressure likely related to myofascial pain  1. Cervical cancer screening - Cytology - PAP( [place])  2. Myofascial pain Discussed ddx for pain - no e/o ectopic/torsion/PID. UPT ordered but patient left before it was collected. GC/CT/trich on pap.  Reviewed option for tylenol/ibuprofen prn, role for PFPT. Pt would like to try PFPT.  - Ambulatory referral to Physical Therapy  Return if symptoms worsen or fail to improve.  Lennart Pall, MD

## 2023-10-22 NOTE — Progress Notes (Unsigned)
Patient states her cervix is low and has been for about 2 weeks. Denies pain but c/o abnormal bleeding. Pt states she is not sexually active.

## 2023-10-26 LAB — CYTOLOGY - PAP
Chlamydia: NEGATIVE
Comment: NEGATIVE
Comment: NORMAL
Diagnosis: NEGATIVE
Neisseria Gonorrhea: NEGATIVE

## 2023-11-07 ENCOUNTER — Encounter: Payer: 59 | Admitting: Obstetrics & Gynecology

## 2023-11-08 ENCOUNTER — Ambulatory Visit: Payer: 59

## 2023-11-08 ENCOUNTER — Ambulatory Visit: Payer: 59 | Attending: Obstetrics and Gynecology | Admitting: Rehabilitative and Restorative Service Providers"

## 2023-11-12 ENCOUNTER — Ambulatory Visit: Payer: 59 | Admitting: Physical Therapy

## 2023-11-12 NOTE — Therapy (Unsigned)
OUTPATIENT PHYSICAL THERAPY LOWER EXTREMITY EVALUATION   Patient Name: Nicole Case MRN: [id number] DOB:05-28-2001, 22 y.o., female Today's Date: 11/12/2023  END OF SESSION:   Past Medical History:  Diagnosis Date   Anxiety    Gestational diabetes    Sickle cell anemia (HCC)    Past Surgical History:  Procedure Laterality Date   NO PAST SURGERIES     Patient Active Problem List   Diagnosis Date Noted   Gestational hypertension 01/04/2021   Decreased fetal movement during pregnancy in third trimester, antepartum 12/31/2020   GBS (group B Streptococcus carrier), +RV culture, currently pregnant 12/27/2020   History of COVID-19 11/24/2020   Gestational diabetes mellitus, diet-controlled 11/24/2020   Thrombocytopenia affecting pregnancy (HCC) 10/21/2020   Hypokalemia 10/20/2020   Normal IUP (intrauterine pregnancy) on prenatal ultrasound, third trimester 10/20/2020   Pneumonia due to COVID-19 virus 10/19/2020   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   Carrier of genetic disorder 07/10/2020   Pap 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 07/06/2020   Trichomonal vaginitis during pregnancy in first trimester 07/03/2020   Anemia 07/03/2020   Maternal morbid obesity, antepartum (HCC) 06/29/2020   Supervision of high risk pregnancy, antepartum 06/28/2020   Central chest pain    Sickle cell anemia with crisis (HCC) 06/20/2020   Sore throat 10/06/2019   Fever 10/06/2019   Urinary tract infection symptoms 10/06/2019   Sickle-cell disease with pain (HCC) 08/01/2017   Sickle cell disease, type [place], with unspecified crisis (HCC) 07/31/2017   Sickle cell anemia with pain (HCC) 05/28/2016   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  Rationale for Evaluation and Treatment: Rehabilitation  ONSET DATE: ***  SUBJECTIVE:   SUBJECTIVE STATEMENT: ***  PERTINENT HISTORY: *** PAIN:  Are you having pain? Yes: NPRS scale: ***/10 Pain location: *** Pain description: *** Aggravating factors: *** Relieving factors: ***  PRECAUTIONS: {Therapy precautions:24002}  RED FLAGS: {PT Red Flags:29287}   WEIGHT BEARING RESTRICTIONS: {Yes ***/No:24003}  FALLS:  Has patient fallen in last 6 months? {fallsyesno:27318}  LIVING ENVIRONMENT: Lives with: {[facility] lives with:25569::"lives with their family"} Lives in: {Lives in:25570} Stairs: {opstairs:27293} Has following equipment at home: {Assistive devices:23999}  OCCUPATION: ***  PLOF: {PLOF:24004}  PATIENT GOALS: ***  NEXT MD VISIT: ***  OBJECTIVE:  Note: Objective measures were completed at Evaluation unless otherwise noted.  DIAGNOSTIC FINDINGS: ***  PATIENT SURVEYS:  {rehab surveys:24030}  COGNITION: Overall cognitive status: {cognition:24006}     SENSATION: {sensation:27233}  EDEMA:  {edema:24020}  MUSCLE LENGTH: Hamstrings: Right *** deg; Left *** deg Maisie Fus test: Right *** deg; Left *** deg  POSTURE: {posture:25561}  PALPATION: ***  LOWER EXTREMITY ROM:  {AROM/PROM:27142} ROM Right eval Left eval  Hip flexion    Hip extension    Hip abduction    Hip adduction    Hip internal rotation    Hip external rotation    Knee flexion    Knee extension    Ankle dorsiflexion    Ankle plantarflexion    Ankle inversion    Ankle eversion     (Blank rows = not tested)  LOWER EXTREMITY MMT:  MMT Right eval Left eval  Hip flexion    Hip extension    Hip abduction  Hip adduction    Hip internal rotation    Hip external rotation    Knee flexion    Knee extension    Ankle dorsiflexion    Ankle plantarflexion    Ankle inversion    Ankle eversion     (Blank rows = not tested)  LOWER EXTREMITY SPECIAL  TESTS:  {LEspecialtests:26242}  FUNCTIONAL TESTS:  {Functional tests:24029}  GAIT: Distance walked: *** Assistive device utilized: {Assistive devices:23999} Level of assistance: {Levels of assistance:24026} Comments: ***   TODAY'S TREATMENT:                                                                                                                              DATE: ***    PATIENT EDUCATION:  Education details: *** Person educated: {Person educated:25204} Education method: {Education Method:25205} Education comprehension: {Education Comprehension:25206}  HOME EXERCISE PROGRAM: ***  ASSESSMENT:  CLINICAL IMPRESSION: Patient is a 22 y.o. female who was seen today for physical therapy evaluation and treatment for pelvic pain/ pressure.   OBJECTIVE IMPAIRMENTS: {opptimpairments:25111}.   ACTIVITY LIMITATIONS: {activitylimitations:27494}  PARTICIPATION LIMITATIONS: {participationrestrictions:25113}  PERSONAL FACTORS: {Personal factors:25162} are also affecting patient's functional outcome.   REHAB POTENTIAL: {rehabpotential:25112}  CLINICAL DECISION MAKING: {clinical decision making:25114}  EVALUATION COMPLEXITY: {Evaluation complexity:25115}   GOALS: Goals reviewed with patient? {yes/no:20286}  SHORT TERM GOALS: Target date: *** *** Baseline: Goal status: INITIAL  2.  *** Baseline:  Goal status: INITIAL  3.  *** Baseline:  Goal status: INITIAL  4.  *** Baseline:  Goal status: INITIAL  5.  *** Baseline:  Goal status: INITIAL  6.  *** Baseline:  Goal status: INITIAL  LONG TERM GOALS: Target date: ***  *** Baseline:  Goal status: INITIAL  2.  *** Baseline:  Goal status: INITIAL  3.  *** Baseline:  Goal status: INITIAL  4.  *** Baseline:  Goal status: INITIAL  5.  *** Baseline:  Goal status: INITIAL  6.  *** Baseline:  Goal status: INITIAL   PLAN:  PT FREQUENCY: {rehab frequency:25116}  PT DURATION: {rehab  duration:25117}  PLANNED INTERVENTIONS: {rehab planned interventions:25118::"97110-Therapeutic exercises","97530- Therapeutic [phone]- Neuromuscular re-education","97535- Self VQQV","95638- Manual therapy"}  PLAN FOR NEXT SESSION: Claude Manges, PT 11/12/2023, 7:51 AM

## 2023-11-21 ENCOUNTER — Ambulatory Visit: Payer: 59 | Admitting: Physical Therapy

## 2024-07-28 ENCOUNTER — Other Ambulatory Visit: Payer: Self-pay

## 2024-07-28 ENCOUNTER — Encounter (HOSPITAL_COMMUNITY): Payer: Self-pay | Admitting: *Deleted

## 2024-07-28 ENCOUNTER — Ambulatory Visit (HOSPITAL_COMMUNITY)
Admission: EM | Admit: 2024-07-28 | Discharge: 2024-07-28 | Disposition: A | Discharge status: Home / Self Care | Attending: Family Medicine | Admitting: Family Medicine

## 2024-07-28 ENCOUNTER — Ambulatory Visit (INDEPENDENT_AMBULATORY_CARE_PROVIDER_SITE_OTHER)

## 2024-07-28 DIAGNOSIS — Z3202 Encounter for pregnancy test, result negative: Secondary | ICD-10-CM | POA: Diagnosis not present

## 2024-07-28 DIAGNOSIS — R051 Acute cough: Secondary | ICD-10-CM

## 2024-07-28 DIAGNOSIS — B349 Viral infection, unspecified: Secondary | ICD-10-CM

## 2024-07-28 LAB — POCT URINE PREGNANCY: Preg Test, Ur: NEGATIVE

## 2024-07-28 MED ORDER — ONDANSETRON 4 MG PO TBDP: 4.0000 mg | ORAL_TABLET | Freq: Three times a day (TID) | ORAL | 0 refills | Status: AC | PRN

## 2024-07-28 MED ORDER — DICYCLOMINE HCL 20 MG PO TABS: 20.0000 mg | ORAL_TABLET | Freq: Four times a day (QID) | ORAL | 0 refills | Status: AC | PRN

## 2024-07-28 NOTE — Discharge Instructions (Addendum)
 The pregnancy test is negative.  By my review, the chest xray is clear. The radiologist will also read your x-ray, and if their interpretation differs significantly from mine, and the management of your condition would change, we will call you.  Dicyclomine --take 1 every 6 hours as needed for intestinal cramps  Ondansetron  dissolved in the mouth every 8 hours as needed for nausea or vomiting. Clear liquids(water, gatorade/pedialyte, ginger ale/sprite, chicken broth/soup) and bland things(crackers/toast, rice, potato, bananas) to eat. Avoid acidic foods like lemon/lime/orange/tomato, and avoid greasy/spicy foods.  Please go to the ER if you worsen in anyway.  Followup with your primary care

## 2024-07-28 NOTE — ED Triage Notes (Addendum)
 C/O cough, fevers up to 103, nausea onset 1 wk ago. States able to keep down PO fluids. Denies pain. Denies nausea at this time.

## 2024-07-28 NOTE — ED Provider Notes (Signed)
 [facility]    CSN: [id number] Arrival date & time: 07/28/24  1725      History   Chief Complaint Chief Complaint  Patient presents with   Fever    HPI Nicole Case is a 23 y.o. female.    Fever Here for nausea and vomiting and cough and fever. About 7-8 days ago she had fever, up to 103, and that lasted about 3 days. She also began having cough and nausea, and has had a few episodes of vomiting, last emesis about 2 days ago. No diarrhea, BM's ok. Having some abd pain around her umbilicus.  No nasal congestion or rhinorrhea or postnasal dc or mucus production. She did not do any viral testing at home.  NKDA  LMP 06/23/24.   No dysuria; no blood in the urine or stool or emesis.    Past Medical History:  Diagnosis Date   Anxiety    Gestational diabetes    Sickle cell anemia (HCC)     Patient Active Problem List   Diagnosis Date Noted   Gestational hypertension 01/04/2021   Decreased fetal movement during pregnancy in third trimester, antepartum 12/31/2020   GBS (group B Streptococcus carrier), +RV culture, currently pregnant 12/27/2020   History of COVID-19 11/24/2020   Gestational diabetes mellitus, diet-controlled 11/24/2020   Thrombocytopenia affecting pregnancy (HCC) 10/21/2020   Hypokalemia 10/20/2020   Normal IUP (intrauterine pregnancy) on prenatal ultrasound, third trimester 10/20/2020   Pneumonia due to COVID-19 virus 10/19/2020   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   Carrier of genetic disorder 07/10/2020   Pap 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 07/06/2020   Trichomonal vaginitis during pregnancy in first trimester 07/03/2020   Anemia 07/03/2020   Maternal morbid obesity, antepartum (HCC) 06/29/2020   Supervision of high risk pregnancy, antepartum 06/28/2020   Central chest pain    Sickle cell anemia with crisis (HCC) 06/20/2020   Sore throat 10/06/2019   Fever 10/06/2019   Urinary tract infection symptoms  10/06/2019   Sickle-cell disease with pain (HCC) 08/01/2017   Sickle cell disease, type [place], with unspecified crisis (HCC) 07/31/2017   Sickle cell anemia with pain (HCC) 05/28/2016   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 Authorizing Provider  dicyclomine  (BENTYL ) 20 MG tablet Take 1 tablet (20 mg total) by mouth 4 (four) times daily as needed (intestinal cramps). 07/28/24  Yes Vonna Sharlet POUR, MD  ondansetron  (ZOFRAN -ODT) 4 MG disintegrating tablet Take 1 tablet (4 mg total) by mouth every 8 (eight) hours as needed for nausea or vomiting. 07/28/24  Yes Vonna Sharlet POUR, MD    Family History Family History  Problem Relation Age of Onset   Sickle cell anemia Brother  Social History Social History   Tobacco Use   Smoking status: Never   Smokeless tobacco: Never  Vaping Use   Vaping status: Some Days  Substance Use Topics   Alcohol use: No   Drug use: Not Currently    Types: Marijuana     Allergies   Patient has no known allergies.   Review of Systems Review of Systems  Constitutional:  Positive for fever.     Physical Exam Triage Vital Signs ED Triage Vitals [07/28/24 1847]  Encounter Vitals Group     BP (!) 140/74     Girls Systolic BP Percentile      Girls Diastolic BP Percentile      Boys Systolic BP Percentile      Boys Diastolic BP Percentile      Pulse Rate (!) 110     Resp 16     Temp 98.4 F (36.9 C)     Temp Source Oral     SpO2 97 %     Weight      Height      Head  Circumference      Peak Flow      Pain Score 0     Pain Loc      Pain Education      Exclude from Growth Chart    No data found.  Updated Vital Signs BP (!) 140/74   Pulse (!) 110   Temp 98.4 F (36.9 C) (Oral)   Resp 16   LMP 06/23/2024 (Approximate)   SpO2 97%   Visual Acuity Right Eye Distance:   Left Eye Distance:   Bilateral Distance:    Right Eye Near:   Left Eye Near:    Bilateral Near:     Physical Exam Vitals reviewed.  Constitutional:      General: She is not in acute distress.    Appearance: She is not ill-appearing, toxic-appearing or diaphoretic.  HENT:     Right Ear: Tympanic membrane and ear canal normal.     Left Ear: Tympanic membrane and ear canal normal.     Nose: Nose normal.     Mouth/Throat:     Mouth: Mucous membranes are moist.     Pharynx: No oropharyngeal exudate or posterior oropharyngeal erythema.  Eyes:     Extraocular Movements: Extraocular movements intact.     Conjunctiva/sclera: Conjunctivae normal.     Pupils: Pupils are equal, round, and reactive to light.  Cardiovascular:     Rate and Rhythm: Normal rate and regular rhythm.     Heart sounds: No murmur heard. Pulmonary:     Effort: Pulmonary effort is normal. No respiratory distress.     Breath sounds: No wheezing, rhonchi or rales.  Chest:     Chest wall: No tenderness.  Musculoskeletal:     Cervical back: Neck supple.  Lymphadenopathy:     Cervical: No cervical adenopathy.  Skin:    Capillary Refill: Capillary refill takes less than 2 seconds.     Coloration: Skin is not jaundiced or pale.  Neurological:     General: No focal deficit present.     Mental Status: She is alert and oriented to person, place, and time.  Psychiatric:        Behavior: Behavior normal.      UC Treatments / Results  Labs (all labs ordered are listed, but only abnormal results are displayed) Labs Reviewed  POCT URINE PREGNANCY    EKG   Radiology No results  found.  Procedures Procedures (including critical care time)  Medications Ordered in UC Medications - No data to display  Initial Impression / Assessment and Plan / UC Course  I have reviewed the triage vital signs and the nursing notes.  Pertinent labs & imaging results that were available during my care of the patient were reviewed by me and considered in my medical decision making (see chart for details).     UPT is negative Viral testing would not be helpful at this time, 8 days into the illness. CXR is done as she has a h/o SS disease, and has the cough. It is unchanged from CXR from 2024, no infiltrate/fluid  Zofran  and bentyl  are sent in for her symptoms. To the ER if worse or not improving. Final Clinical Impressions(s) / UC Diagnoses   Final diagnoses:  Acute cough  Viral syndrome     Discharge Instructions      The pregnancy test is negative.  By my review, the chest xray is clear. The radiologist will also read your x-ray, and if their interpretation differs significantly from mine, and the management of your condition would change, we will call you.  Dicyclomine --take 1 every 6 hours as needed for intestinal cramps  Ondansetron  dissolved in the mouth every 8 hours as needed for nausea or vomiting. Clear liquids(water, gatorade/pedialyte, ginger ale/sprite, chicken broth/soup) and bland things(crackers/toast, rice, potato, bananas) to eat. Avoid acidic foods like lemon/lime/orange/tomato, and avoid greasy/spicy foods.  Please go to the ER if you worsen in anyway.  Followup with your primary care     ED Prescriptions     Medication Sig Dispense Auth. Provider   ondansetron  (ZOFRAN -ODT) 4 MG disintegrating tablet Take 1 tablet (4 mg total) by mouth every 8 (eight) hours as needed for nausea or vomiting. 10 tablet Vonna Sharlet POUR, MD   dicyclomine  (BENTYL ) 20 MG tablet Take 1 tablet (20 mg total) by mouth 4 (four) times daily as needed (intestinal cramps).  20 tablet Iley Breeden K, MD      PDMP not reviewed this encounter.   Vonna Sharlet POUR, MD 07/28/24 [phone]

## 2024-08-11 ENCOUNTER — Ambulatory Visit: Payer: Self-pay | Admitting: Nurse Practitioner

## 2024-08-11 ENCOUNTER — Ambulatory Visit: Payer: Self-pay

## 2024-08-11 NOTE — Telephone Encounter (Signed)
 FYI Only or Action Required?: FYI only for provider.  Patient was last seen in primary care on 10/08/2023 by Oley Bascom RAMAN, NP.  Called Nurse Triage reporting Arm Pain.  Symptoms began a week ago.  Interventions attempted: Nothing.  Symptoms are: stable.  Triage Disposition: See PCP Within 2 Weeks  Patient/caregiver understands and will follow disposition?: Yes Reason for Disposition  [1] MILD pain (e.g., does not interfere with normal activities) AND [2] present > 7 days  Answer Assessment - Initial Assessment Questions Diagnosed with sickle cell anemia.   1. ONSET: When did the pain start?     Week and a half ago  2. LOCATION: Where is the pain located?     Entirety of right arm  3. PAIN: How bad is the pain? (Scale 0-10; or none, mild, moderate, severe)     Constant, especially when moving arm  4. WORK OR EXERCISE: Has there been any recent work or exercise that involved this part of the body?     No  5. CAUSE: What do you think is causing the arm pain?     A sickle cell crisis  6. OTHER SYMPTOMS: Do you have any other symptoms? (e.g., neck pain, swelling, rash, fever, numbness, weakness)     No  Protocols used: Arm Pain-A-AH  Copied from CRM #[id number]. Topic: Clinical - Red Word Triage >> Aug 11, 2024  1:06 PM Debby BROCKS wrote: Red Word that prompted transfer to Nurse Triage: Arm pain that continues to grow for about 2 weeks. Would like to see someone in patient care center

## 2024-08-11 NOTE — Telephone Encounter (Signed)
 Patient called back wanting to reschedule her appointment that had just been made. Appointment for today was canceled as patient doesn't have a ride. Patient states This is how I get an inpatient bed right? Patient states she was wanting to get into the day hospital. Placed patient on a brief hold-attempted to call day hospital twice with no answer. Patient is asking for a call back from the office.    Patient's profile listed as No PCP but patient is established with patient care center per decision tree.
